# Patient Record
Sex: Female | Born: 1950 | Race: White | Hispanic: No | Marital: Married | Smoking: Current every day smoker
Health system: Southern US, Community
[De-identification: ages and names within clinical notes are randomized; demographics above are authoritative.]

## PROBLEM LIST (undated history)

## (undated) DIAGNOSIS — C801 Malignant (primary) neoplasm, unspecified: Secondary | ICD-10-CM

## (undated) DIAGNOSIS — I10 Essential (primary) hypertension: Secondary | ICD-10-CM

## (undated) HISTORY — DX: Essential (primary) hypertension: I10

---

## 1998-12-27 ENCOUNTER — Ambulatory Visit (HOSPITAL_COMMUNITY): Admission: RE | Admit: 1998-12-27 | Discharge: 1998-12-27 | Payer: Self-pay | Admitting: Obstetrics and Gynecology

## 1998-12-27 ENCOUNTER — Encounter: Payer: Self-pay | Admitting: Obstetrics and Gynecology

## 1999-05-11 ENCOUNTER — Other Ambulatory Visit: Admission: RE | Admit: 1999-05-11 | Discharge: 1999-05-11 | Payer: Self-pay | Admitting: Obstetrics and Gynecology

## 1999-07-03 ENCOUNTER — Encounter: Payer: Self-pay | Admitting: Obstetrics and Gynecology

## 1999-07-03 ENCOUNTER — Ambulatory Visit (HOSPITAL_COMMUNITY): Admission: RE | Admit: 1999-07-03 | Discharge: 1999-07-03 | Payer: Self-pay | Admitting: Obstetrics and Gynecology

## 1999-09-27 ENCOUNTER — Encounter (INDEPENDENT_AMBULATORY_CARE_PROVIDER_SITE_OTHER): Payer: Self-pay

## 1999-09-27 ENCOUNTER — Other Ambulatory Visit: Admission: RE | Admit: 1999-09-27 | Discharge: 1999-09-27 | Payer: Self-pay | Admitting: Obstetrics and Gynecology

## 1999-12-12 ENCOUNTER — Encounter (INDEPENDENT_AMBULATORY_CARE_PROVIDER_SITE_OTHER): Payer: Self-pay | Admitting: Specialist

## 1999-12-12 ENCOUNTER — Ambulatory Visit (HOSPITAL_COMMUNITY): Admission: RE | Admit: 1999-12-12 | Discharge: 1999-12-12 | Payer: Self-pay | Admitting: Obstetrics and Gynecology

## 2000-03-12 ENCOUNTER — Encounter (INDEPENDENT_AMBULATORY_CARE_PROVIDER_SITE_OTHER): Payer: Self-pay | Admitting: Specialist

## 2000-03-12 ENCOUNTER — Other Ambulatory Visit: Admission: RE | Admit: 2000-03-12 | Discharge: 2000-03-12 | Payer: Self-pay | Admitting: Obstetrics and Gynecology

## 2000-05-17 ENCOUNTER — Encounter (INDEPENDENT_AMBULATORY_CARE_PROVIDER_SITE_OTHER): Payer: Self-pay | Admitting: Specialist

## 2000-05-17 ENCOUNTER — Other Ambulatory Visit: Admission: RE | Admit: 2000-05-17 | Discharge: 2000-05-17 | Payer: Self-pay | Admitting: Obstetrics and Gynecology

## 2000-06-05 ENCOUNTER — Other Ambulatory Visit: Admission: RE | Admit: 2000-06-05 | Discharge: 2000-06-05 | Payer: Self-pay | Admitting: Obstetrics and Gynecology

## 2001-09-02 ENCOUNTER — Other Ambulatory Visit: Admission: RE | Admit: 2001-09-02 | Discharge: 2001-09-02 | Payer: Self-pay | Admitting: Obstetrics and Gynecology

## 2001-09-10 ENCOUNTER — Ambulatory Visit (HOSPITAL_COMMUNITY): Admission: RE | Admit: 2001-09-10 | Discharge: 2001-09-10 | Payer: Self-pay | Admitting: Obstetrics and Gynecology

## 2001-09-10 ENCOUNTER — Encounter: Payer: Self-pay | Admitting: Obstetrics and Gynecology

## 2001-09-17 ENCOUNTER — Encounter: Payer: Self-pay | Admitting: Obstetrics and Gynecology

## 2001-09-17 ENCOUNTER — Encounter: Admission: RE | Admit: 2001-09-17 | Discharge: 2001-09-17 | Payer: Self-pay | Admitting: Obstetrics and Gynecology

## 2001-09-22 ENCOUNTER — Encounter (INDEPENDENT_AMBULATORY_CARE_PROVIDER_SITE_OTHER): Payer: Self-pay | Admitting: *Deleted

## 2001-09-22 ENCOUNTER — Encounter: Admission: RE | Admit: 2001-09-22 | Discharge: 2001-09-22 | Payer: Self-pay | Admitting: Obstetrics and Gynecology

## 2001-09-22 ENCOUNTER — Encounter: Payer: Self-pay | Admitting: Obstetrics and Gynecology

## 2002-03-30 ENCOUNTER — Ambulatory Visit (HOSPITAL_COMMUNITY): Admission: RE | Admit: 2002-03-30 | Discharge: 2002-03-30 | Payer: Self-pay | Admitting: Obstetrics and Gynecology

## 2002-03-31 ENCOUNTER — Encounter: Payer: Self-pay | Admitting: Obstetrics and Gynecology

## 2002-09-17 ENCOUNTER — Other Ambulatory Visit: Admission: RE | Admit: 2002-09-17 | Discharge: 2002-09-17 | Payer: Self-pay | Admitting: Obstetrics and Gynecology

## 2002-09-30 ENCOUNTER — Encounter: Payer: Self-pay | Admitting: Geriatric Medicine

## 2002-09-30 ENCOUNTER — Encounter: Admission: RE | Admit: 2002-09-30 | Discharge: 2002-09-30 | Payer: Self-pay | Admitting: Geriatric Medicine

## 2003-09-20 ENCOUNTER — Other Ambulatory Visit: Admission: RE | Admit: 2003-09-20 | Discharge: 2003-09-20 | Payer: Self-pay | Admitting: Obstetrics and Gynecology

## 2003-10-08 ENCOUNTER — Encounter: Admission: RE | Admit: 2003-10-08 | Discharge: 2003-10-08 | Payer: Self-pay | Admitting: Obstetrics and Gynecology

## 2004-05-09 ENCOUNTER — Encounter: Admission: RE | Admit: 2004-05-09 | Discharge: 2004-05-09 | Payer: Self-pay | Admitting: Obstetrics and Gynecology

## 2004-10-02 ENCOUNTER — Other Ambulatory Visit: Admission: RE | Admit: 2004-10-02 | Discharge: 2004-10-02 | Payer: Self-pay | Admitting: Obstetrics and Gynecology

## 2004-10-13 ENCOUNTER — Encounter: Admission: RE | Admit: 2004-10-13 | Discharge: 2004-10-13 | Payer: Self-pay | Admitting: Obstetrics and Gynecology

## 2005-09-12 ENCOUNTER — Encounter: Admission: RE | Admit: 2005-09-12 | Discharge: 2005-09-12 | Payer: Self-pay | Admitting: Geriatric Medicine

## 2005-10-29 ENCOUNTER — Encounter: Admission: RE | Admit: 2005-10-29 | Discharge: 2005-10-29 | Payer: Self-pay | Admitting: Obstetrics and Gynecology

## 2006-07-25 ENCOUNTER — Other Ambulatory Visit: Admission: RE | Admit: 2006-07-25 | Discharge: 2006-07-25 | Payer: Self-pay | Admitting: Obstetrics and Gynecology

## 2006-10-31 ENCOUNTER — Encounter: Admission: RE | Admit: 2006-10-31 | Discharge: 2006-10-31 | Payer: Self-pay | Admitting: Obstetrics and Gynecology

## 2007-08-04 ENCOUNTER — Other Ambulatory Visit: Admission: RE | Admit: 2007-08-04 | Discharge: 2007-08-04 | Payer: Self-pay | Admitting: Obstetrics and Gynecology

## 2007-11-13 ENCOUNTER — Encounter: Admission: RE | Admit: 2007-11-13 | Discharge: 2007-11-13 | Payer: Self-pay | Admitting: Obstetrics and Gynecology

## 2008-08-04 ENCOUNTER — Other Ambulatory Visit: Admission: RE | Admit: 2008-08-04 | Discharge: 2008-08-04 | Payer: Self-pay | Admitting: Obstetrics and Gynecology

## 2008-12-29 ENCOUNTER — Encounter: Admission: RE | Admit: 2008-12-29 | Discharge: 2008-12-29 | Payer: Self-pay | Admitting: Obstetrics and Gynecology

## 2009-07-06 ENCOUNTER — Encounter: Admission: RE | Admit: 2009-07-06 | Discharge: 2009-07-06 | Payer: Self-pay | Admitting: Geriatric Medicine

## 2009-08-04 ENCOUNTER — Other Ambulatory Visit: Admission: RE | Admit: 2009-08-04 | Discharge: 2009-08-04 | Payer: Self-pay | Admitting: Obstetrics and Gynecology

## 2010-01-02 ENCOUNTER — Encounter: Admission: RE | Admit: 2010-01-02 | Discharge: 2010-01-02 | Payer: Self-pay | Admitting: Obstetrics and Gynecology

## 2010-08-16 ENCOUNTER — Other Ambulatory Visit
Admission: RE | Admit: 2010-08-16 | Discharge: 2010-08-16 | Payer: Self-pay | Source: Home / Self Care | Admitting: Obstetrics and Gynecology

## 2010-12-05 ENCOUNTER — Other Ambulatory Visit: Payer: Self-pay | Admitting: Obstetrics and Gynecology

## 2010-12-05 DIAGNOSIS — Z1231 Encounter for screening mammogram for malignant neoplasm of breast: Secondary | ICD-10-CM

## 2011-01-09 ENCOUNTER — Ambulatory Visit
Admission: RE | Admit: 2011-01-09 | Discharge: 2011-01-09 | Disposition: A | Discharge status: Home / Self Care | Payer: 59 | Source: Ambulatory Visit | Attending: Obstetrics and Gynecology | Admitting: Obstetrics and Gynecology

## 2011-01-09 DIAGNOSIS — Z1231 Encounter for screening mammogram for malignant neoplasm of breast: Secondary | ICD-10-CM

## 2011-01-10 ENCOUNTER — Other Ambulatory Visit: Payer: Self-pay | Admitting: Obstetrics and Gynecology

## 2011-01-10 DIAGNOSIS — R928 Other abnormal and inconclusive findings on diagnostic imaging of breast: Secondary | ICD-10-CM

## 2011-01-12 NOTE — Op Note (Signed)
[facility]  Patient:    Amber Hayden, Amber Hayden                          MRN: [id number] Proc. Date: 12/12/99 Adm. Date:  [id number] Attending:  Madelyn Flavors                           Operative Report  PREOPERATIVE DIAGNOSIS:       Right inner thigh nevus, quite irritated. 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  POSTOPERATIVE DIAGNOSIS:      Right inner thigh nevus, quite irritated. 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  OPERATION:                    Dilatation and curettage, hysteroscopy, removal of right inner thigh nevus.  SURGEON:                      Beather Arbour. Thomasena Edis, M.D.  ASSISTANT:  COMPLICATIONS:                None.  ANESTHESIA:                   Monitored anesthesia care plus 10 cc of 1% lidocaine paracervical block and anesthesia to thigh nevus.  ESTIMATED BLOOD LOSS:         Minimal.  FLUIDS:                       950 cc of Crystalloid.  DESCRIPTION OF PROCEDURE:     The patient was brought to the operating room, identified on the operating table.  After the patient was adequately sedated with MAC analgesia, the patient was placed in the dorsal lithotomy position and prepped and draped in the usual sterile fashion.  The bladder was straight catheterized for approximately 50 cc of clear yellow urine.  Examination under anesthesia revealed the uterus to be about 8 weeks size, retroverted, without any adnexal masses. After the patient was prepped and draped in the usual sterile fashion, the right inner thigh nevus which was only attached via a small stalk was infiltrated with 0.5 cc of 1% lidocaine and removed and sent to pathology for examination. Attention was then turned to the [facility].  The posterior lip of the cervix was infiltrated with 1 cc of 1% lidocaine and grasped with a single tooth tenaculum. The remaining lidocaine was infused for paracervical block.  The cervix was very gently dilated up to a #25 Pratt dilator.  The  uterus sounded to 8 cm.  Using Sorbitol as the distending medium, the [facility] hysteroscope was placed into the endometrial cavity.  The endometrial cavity was examined in a systematic fashion. Both tubal ostia were noted.  There was noted to be some shaggy endometrial tissue. After a careful and thorough hysteroscopic examination, the hysteroscope was removed and the serrated curet was placed.  The uterus was curetted in a systematic clockwise fashion with copious tissue obtained.  The Randall stone forceps were  placed and additional tissue was obtained.  At that point, the hysteroscope was  again placed.  All areas where there had been tissue were noted to be thoroughly sampled.  All tissue was noted to have been removed.  At that point, the procedure was then terminated.  There was noted to be no bleeding from the tenaculum site. The patient tolerated the procedure well without apparent complications and  was  transferred to the recovery room in stable condition after all sponge, needle, nd instrument counts were correct.  The patient was given the post [facility] instruction  sheet, urged to return to the office in two to three weeks for postoperative examination, and to call for any problems. DD:  12/12/99 TD:  12/12/99 Job: [id number]

## 2011-01-16 ENCOUNTER — Ambulatory Visit
Admission: RE | Admit: 2011-01-16 | Discharge: 2011-01-16 | Disposition: A | Discharge status: Home / Self Care | Payer: 59 | Source: Ambulatory Visit | Attending: Obstetrics and Gynecology | Admitting: Obstetrics and Gynecology

## 2011-01-16 DIAGNOSIS — R928 Other abnormal and inconclusive findings on diagnostic imaging of breast: Secondary | ICD-10-CM

## 2011-07-16 ENCOUNTER — Other Ambulatory Visit: Payer: Self-pay | Admitting: Geriatric Medicine

## 2011-07-16 DIAGNOSIS — E041 Nontoxic single thyroid nodule: Secondary | ICD-10-CM

## 2011-07-23 ENCOUNTER — Ambulatory Visit
Admission: RE | Admit: 2011-07-23 | Discharge: 2011-07-23 | Disposition: A | Discharge status: Home / Self Care | Payer: 59 | Source: Ambulatory Visit | Attending: Geriatric Medicine | Admitting: Geriatric Medicine

## 2011-07-23 DIAGNOSIS — E041 Nontoxic single thyroid nodule: Secondary | ICD-10-CM

## 2011-08-30 ENCOUNTER — Other Ambulatory Visit: Payer: Self-pay | Admitting: Obstetrics and Gynecology

## 2011-08-30 ENCOUNTER — Other Ambulatory Visit (HOSPITAL_COMMUNITY)
Admission: RE | Admit: 2011-08-30 | Discharge: 2011-08-30 | Disposition: A | Discharge status: Home / Self Care | Payer: 59 | Source: Ambulatory Visit | Attending: Obstetrics and Gynecology | Admitting: Obstetrics and Gynecology

## 2011-08-30 DIAGNOSIS — Z1159 Encounter for screening for other viral diseases: Secondary | ICD-10-CM | POA: Insufficient documentation

## 2011-08-30 DIAGNOSIS — Z01419 Encounter for gynecological examination (general) (routine) without abnormal findings: Secondary | ICD-10-CM | POA: Insufficient documentation

## 2011-12-27 ENCOUNTER — Other Ambulatory Visit: Payer: Self-pay | Admitting: Obstetrics and Gynecology

## 2011-12-27 DIAGNOSIS — Z1231 Encounter for screening mammogram for malignant neoplasm of breast: Secondary | ICD-10-CM

## 2012-01-18 ENCOUNTER — Ambulatory Visit
Admission: RE | Admit: 2012-01-18 | Discharge: 2012-01-18 | Disposition: A | Discharge status: Home / Self Care | Payer: 59 | Source: Ambulatory Visit | Attending: Obstetrics and Gynecology | Admitting: Obstetrics and Gynecology

## 2012-01-18 DIAGNOSIS — Z1231 Encounter for screening mammogram for malignant neoplasm of breast: Secondary | ICD-10-CM

## 2012-07-16 ENCOUNTER — Other Ambulatory Visit: Payer: Self-pay | Admitting: Geriatric Medicine

## 2012-07-16 DIAGNOSIS — E041 Nontoxic single thyroid nodule: Secondary | ICD-10-CM

## 2012-07-30 ENCOUNTER — Ambulatory Visit
Admission: RE | Admit: 2012-07-30 | Discharge: 2012-07-30 | Disposition: A | Discharge status: Home / Self Care | Payer: 59 | Source: Ambulatory Visit | Attending: Geriatric Medicine | Admitting: Geriatric Medicine

## 2012-07-30 DIAGNOSIS — E041 Nontoxic single thyroid nodule: Secondary | ICD-10-CM

## 2012-07-31 ENCOUNTER — Other Ambulatory Visit: Payer: Self-pay | Admitting: Dermatology

## 2012-09-02 ENCOUNTER — Other Ambulatory Visit (HOSPITAL_COMMUNITY)
Admission: RE | Admit: 2012-09-02 | Discharge: 2012-09-02 | Disposition: A | Discharge status: Home / Self Care | Payer: 59 | Source: Ambulatory Visit | Attending: Obstetrics and Gynecology | Admitting: Obstetrics and Gynecology

## 2012-09-02 ENCOUNTER — Other Ambulatory Visit: Payer: Self-pay | Admitting: Obstetrics and Gynecology

## 2012-09-02 DIAGNOSIS — Z01419 Encounter for gynecological examination (general) (routine) without abnormal findings: Secondary | ICD-10-CM | POA: Insufficient documentation

## 2012-12-18 ENCOUNTER — Other Ambulatory Visit: Payer: Self-pay

## 2012-12-18 DIAGNOSIS — Z1231 Encounter for screening mammogram for malignant neoplasm of breast: Secondary | ICD-10-CM

## 2013-01-28 ENCOUNTER — Ambulatory Visit
Admission: RE | Admit: 2013-01-28 | Discharge: 2013-01-28 | Disposition: A | Discharge status: Home / Self Care | Payer: 59 | Source: Ambulatory Visit

## 2013-01-28 DIAGNOSIS — Z1231 Encounter for screening mammogram for malignant neoplasm of breast: Secondary | ICD-10-CM

## 2013-07-17 ENCOUNTER — Other Ambulatory Visit: Payer: Self-pay | Admitting: Geriatric Medicine

## 2013-07-17 DIAGNOSIS — F17200 Nicotine dependence, unspecified, uncomplicated: Secondary | ICD-10-CM

## 2013-07-17 DIAGNOSIS — E041 Nontoxic single thyroid nodule: Secondary | ICD-10-CM

## 2013-08-05 ENCOUNTER — Ambulatory Visit
Admission: RE | Admit: 2013-08-05 | Discharge: 2013-08-05 | Disposition: A | Discharge status: Home / Self Care | Payer: No Typology Code available for payment source | Source: Ambulatory Visit | Attending: Geriatric Medicine | Admitting: Geriatric Medicine

## 2013-08-05 ENCOUNTER — Ambulatory Visit
Admission: RE | Admit: 2013-08-05 | Discharge: 2013-08-05 | Disposition: A | Discharge status: Home / Self Care | Payer: 59 | Source: Ambulatory Visit | Attending: Geriatric Medicine | Admitting: Geriatric Medicine

## 2013-08-05 DIAGNOSIS — E041 Nontoxic single thyroid nodule: Secondary | ICD-10-CM

## 2013-08-05 DIAGNOSIS — F17200 Nicotine dependence, unspecified, uncomplicated: Secondary | ICD-10-CM

## 2013-08-06 ENCOUNTER — Telehealth: Payer: Self-pay | Admitting: *Deleted

## 2013-08-06 ENCOUNTER — Other Ambulatory Visit (HOSPITAL_COMMUNITY): Payer: Self-pay | Admitting: Geriatric Medicine

## 2013-08-06 NOTE — Telephone Encounter (Signed)
Received referral today.  Called pt with appt left vm for pt to call with phone number

## 2013-08-07 ENCOUNTER — Encounter (HOSPITAL_COMMUNITY): Payer: Self-pay

## 2013-08-07 ENCOUNTER — Telehealth: Payer: Self-pay | Admitting: *Deleted

## 2013-08-07 ENCOUNTER — Ambulatory Visit (HOSPITAL_COMMUNITY)
Admission: RE | Admit: 2013-08-07 | Discharge: 2013-08-07 | Disposition: A | Discharge status: Home / Self Care | Payer: 59 | Source: Ambulatory Visit | Attending: Geriatric Medicine | Admitting: Geriatric Medicine

## 2013-08-07 DIAGNOSIS — R599 Enlarged lymph nodes, unspecified: Secondary | ICD-10-CM | POA: Insufficient documentation

## 2013-08-07 DIAGNOSIS — C3490 Malignant neoplasm of unspecified part of unspecified bronchus or lung: Secondary | ICD-10-CM | POA: Insufficient documentation

## 2013-08-07 DIAGNOSIS — N9489 Other specified conditions associated with female genital organs and menstrual cycle: Secondary | ICD-10-CM | POA: Insufficient documentation

## 2013-08-07 DIAGNOSIS — C787 Secondary malignant neoplasm of liver and intrahepatic bile duct: Secondary | ICD-10-CM | POA: Insufficient documentation

## 2013-08-07 MED ORDER — IOHEXOL 300 MG/ML  SOLN
100.0000 mL | Freq: Once | INTRAMUSCULAR | Status: AC | PRN
  Administered 2013-08-07: 100 mL via INTRAVENOUS

## 2013-08-07 NOTE — Telephone Encounter (Signed)
Called pt left vm message to call regarding appt for [facility] 08/13/13 with phone number

## 2013-08-11 ENCOUNTER — Encounter: Payer: Self-pay | Admitting: *Deleted

## 2013-08-11 ENCOUNTER — Other Ambulatory Visit (HOSPITAL_COMMUNITY): Payer: Self-pay | Admitting: Geriatric Medicine

## 2013-08-11 ENCOUNTER — Other Ambulatory Visit (HOSPITAL_COMMUNITY): Payer: Self-pay | Admitting: Radiology

## 2013-08-11 ENCOUNTER — Telehealth: Payer: Self-pay | Admitting: *Deleted

## 2013-08-11 DIAGNOSIS — K769 Liver disease, unspecified: Secondary | ICD-10-CM

## 2013-08-11 NOTE — Progress Notes (Signed)
Called referring office for progress notes and lab work.  Left vm message with Dr. Laverle Hobby nurse

## 2013-08-11 NOTE — Telephone Encounter (Signed)
Spoke with pt regarding appt for [facility] 08/13/13 at 1:45.  Pt verbalized understanding of time and place of appt

## 2013-08-11 NOTE — Telephone Encounter (Signed)
Pt called to notify me that she will have biopsy done tomorrow.  She had question about coming to [facility] on Thursday.  I stated she could keep appt.  She verbalized understanding

## 2013-08-12 ENCOUNTER — Ambulatory Visit (HOSPITAL_COMMUNITY)
Admission: RE | Admit: 2013-08-12 | Discharge: 2013-08-12 | Disposition: A | Discharge status: Home / Self Care | Payer: 59 | Source: Ambulatory Visit | Attending: Geriatric Medicine | Admitting: Geriatric Medicine

## 2013-08-12 VITALS — BP 128/61 | HR 59 | Temp 98.0°F | Resp 20 | Ht 62.5 in | Wt 109.0 lb

## 2013-08-12 DIAGNOSIS — Z01812 Encounter for preprocedural laboratory examination: Secondary | ICD-10-CM | POA: Insufficient documentation

## 2013-08-12 DIAGNOSIS — K769 Liver disease, unspecified: Secondary | ICD-10-CM

## 2013-08-12 DIAGNOSIS — I10 Essential (primary) hypertension: Secondary | ICD-10-CM | POA: Insufficient documentation

## 2013-08-12 DIAGNOSIS — R634 Abnormal weight loss: Secondary | ICD-10-CM | POA: Insufficient documentation

## 2013-08-12 DIAGNOSIS — C801 Malignant (primary) neoplasm, unspecified: Secondary | ICD-10-CM | POA: Insufficient documentation

## 2013-08-12 DIAGNOSIS — Z87891 Personal history of nicotine dependence: Secondary | ICD-10-CM | POA: Insufficient documentation

## 2013-08-12 DIAGNOSIS — F17200 Nicotine dependence, unspecified, uncomplicated: Secondary | ICD-10-CM | POA: Insufficient documentation

## 2013-08-12 DIAGNOSIS — C787 Secondary malignant neoplasm of liver and intrahepatic bile duct: Secondary | ICD-10-CM | POA: Insufficient documentation

## 2013-08-12 DIAGNOSIS — J984 Other disorders of lung: Secondary | ICD-10-CM | POA: Insufficient documentation

## 2013-08-12 LAB — CBC
HCT: 40.2 % (ref 36.0–46.0)
Hemoglobin: 13.8 g/dL (ref 12.0–15.0)
MCH: 31.1 pg (ref 26.0–34.0)
MCHC: 34.3 g/dL (ref 30.0–36.0)
MCV: 90.5 fL (ref 78.0–100.0)
RDW: 13 % (ref 11.5–15.5)

## 2013-08-12 LAB — PROTIME-INR: Prothrombin Time: 12.5 seconds (ref 11.6–15.2)

## 2013-08-12 MED ORDER — SODIUM CHLORIDE 0.9 % IV SOLN: Freq: Once | INTRAVENOUS | Status: DC

## 2013-08-12 MED ORDER — FENTANYL CITRATE 0.05 MG/ML IJ SOLN
INTRAMUSCULAR | Status: AC
  Filled 2013-08-12: qty 2

## 2013-08-12 MED ORDER — MIDAZOLAM HCL 2 MG/2ML IJ SOLN
INTRAMUSCULAR | Status: AC | PRN
  Administered 2013-08-12 (×2): 1 mg via INTRAVENOUS

## 2013-08-12 MED ORDER — HYDROCODONE-ACETAMINOPHEN 5-325 MG PO TABS: 1.0000 | ORAL_TABLET | ORAL | Status: DC | PRN

## 2013-08-12 MED ORDER — MIDAZOLAM HCL 2 MG/2ML IJ SOLN
INTRAMUSCULAR | Status: AC
  Filled 2013-08-12: qty 2

## 2013-08-12 MED ORDER — FENTANYL CITRATE 0.05 MG/ML IJ SOLN
INTRAMUSCULAR | Status: AC | PRN
  Administered 2013-08-12 (×2): 25 ug via INTRAVENOUS

## 2013-08-12 NOTE — Procedures (Signed)
Procedure:  Ultrasound guided core biopsy of liver Findings:  Multiple liver masses.  Lesion in right lobe sampled with 18 G core biopsy x 3 via 17 G needle.  Gelfoam injected. No immediate complications.

## 2013-08-12 NOTE — H&P (Signed)
Chief Complaint: "I am here for a liver biopsy." Referring Physician: Dr. Pete Glatter HPI: Amber Hayden is an 62 y.o. female with history of 1 1/2 ppd tobacco use x 40 years with ongoing use. She had a CT of her chest 08/05/13 which revealed a right lower lobe lung lesion and follow up CT abdomen/pelvis 08/07/13 revealed widespread hepatic metastatic disease. IR received request for image guided liver lesion biopsy. She does admit to 10-15 lb weight loss within the past year and decrease in appetite. She denies any change in her chronic cough or hemoptysis. She denies any chest pain. She denies any active bleeding or history of clotting disorders. She denies any fever or chills. She denies any previous complications with sedation during a colonoscopy. She denies any history of sleep apnea or home oxygen use.   Past Medical History: HTN, Tobacco use  Past Surgical History: No pertinent surgical history.   Family History: No family history on file.  Social History: (1) 1/2 ppd tobacco use x 40 years.  Allergies:  Allergies  Allergen Reactions  . Hctz [Hydrochlorothiazide] Nausea Only  . Plendil [Felodipine] Other (See Comments)    REACTION: fatigue    Medications: aspirin 81mg  daily, ACEI, calcium   Medication List    Notice   You have not been prescribed any medications.     Please HPI for pertinent positives, otherwise complete 10 system ROS negative.  Physical Exam: BP 137/64  Pulse 60  Temp(Src) 98 F (36.7 C)  Resp 18  Ht 5' 2.5" (1.588 m)  Wt 109 lb (49.442 kg)  BMI 19.61 kg/m2  SpO2 100% Body mass index is 19.61 kg/(m^2).  General Appearance:  Alert, cooperative, no distress  Head:  Normocephalic, without obvious abnormality, atraumatic  Neck: Supple, symmetrical, trachea midline  Lungs:   Clear to auscultation bilaterally, no w/r/r, respirations unlabored without use of accessory muscles.  Chest Wall:  No tenderness or deformity  Heart:  Regular rate and rhythm, S1,  S2 normal, no murmur, rub or gallop.  Abdomen:   Soft, RUQ tenderness, non distended, (+) BS  Extremities: Extremities normal, atraumatic, no cyanosis or edema  Pulses: 1+ and symmetric  Neurologic: Normal affect, no gross deficits.   Results for orders placed during the hospital encounter of 08/12/13 (from the past 48 hour(s))  APTT     Status: None   Collection Time    08/12/13 12:08 PM      Result Value Range   aPTT 27  24 - 37 seconds  CBC     Status: None   Collection Time    08/12/13 12:08 PM      Result Value Range   WBC 7.7  4.0 - 10.5 K/uL   RBC 4.44  3.87 - 5.11 MIL/uL   Hemoglobin 13.8  12.0 - 15.0 g/dL   HCT 16.1  09.6 - 04.5 %   MCV 90.5  78.0 - 100.0 fL   MCH 31.1  26.0 - 34.0 pg   MCHC 34.3  30.0 - 36.0 g/dL   RDW 40.9  81.1 - 91.4 %   Platelets 314  150 - 400 K/uL  PROTIME-INR     Status: None   Collection Time    08/12/13 12:08 PM      Result Value Range   Prothrombin Time 12.5  11.6 - 15.2 seconds   INR 0.95  0.00 - 1.49   No results found.  Assessment/Plan Tobacco abuse. Weight Loss. Right lower lobe lung lesion, CT 08/05/13  Liver lesion on CT 08/07/13 Request for image guided liver lesion biopsy Patient has been NPO, no blood thinners, labs and images reviewed. Risks and Benefits discussed with the patient. All of the patient's questions were answered, patient is agreeable to proceed. Consent signed and in chart.   Pattricia Boss D PA-C 08/12/2013, 1:41 PM

## 2013-08-12 NOTE — H&P (Signed)
Agree.  Patient seen.  For liver biopsy today.

## 2013-08-13 ENCOUNTER — Encounter: Payer: Self-pay | Admitting: *Deleted

## 2013-08-13 ENCOUNTER — Ambulatory Visit: Payer: 59 | Attending: Internal Medicine | Admitting: Physical Therapy

## 2013-08-13 ENCOUNTER — Telehealth: Payer: Self-pay | Admitting: Internal Medicine

## 2013-08-13 ENCOUNTER — Ambulatory Visit (HOSPITAL_BASED_OUTPATIENT_CLINIC_OR_DEPARTMENT_OTHER): Payer: 59 | Admitting: Internal Medicine

## 2013-08-13 DIAGNOSIS — C801 Malignant (primary) neoplasm, unspecified: Secondary | ICD-10-CM

## 2013-08-13 DIAGNOSIS — R293 Abnormal posture: Secondary | ICD-10-CM | POA: Insufficient documentation

## 2013-08-13 DIAGNOSIS — IMO0001 Reserved for inherently not codable concepts without codable children: Secondary | ICD-10-CM | POA: Insufficient documentation

## 2013-08-13 DIAGNOSIS — C787 Secondary malignant neoplasm of liver and intrahepatic bile duct: Secondary | ICD-10-CM

## 2013-08-13 DIAGNOSIS — C3491 Malignant neoplasm of unspecified part of right bronchus or lung: Secondary | ICD-10-CM | POA: Insufficient documentation

## 2013-08-13 DIAGNOSIS — C3490 Malignant neoplasm of unspecified part of unspecified bronchus or lung: Secondary | ICD-10-CM | POA: Insufficient documentation

## 2013-08-13 DIAGNOSIS — Z803 Family history of malignant neoplasm of breast: Secondary | ICD-10-CM

## 2013-08-13 DIAGNOSIS — F17200 Nicotine dependence, unspecified, uncomplicated: Secondary | ICD-10-CM

## 2013-08-13 NOTE — Progress Notes (Signed)
   Thoracic Treatment Summary Name:Aala JACQUELINA HEWINS Date:08/13/2013 DOB:07/17/1951 Your Medical Team Medical Oncologist: Dr. Arbutus Ped  Type and Stage of Lung Cancer Non-Small Cell Carcinoma  Stage: IV   Clinical stage is based on radiology exams.  Pathological stage will be determined after surgery.  Staging is based on the size of the tumor, involvement of lymph nodes or not, and whether or not the cancer center has spread. Recommendations Recommendations: Chemotherapy  These recommendations are based on information available as of today's consult.  This is subject to change depending further testing or exams. Next Steps Next Step: 1. Medical Oncology will set up MRI Brain, PET scan, Chemo Education, and Chemo appointments  Barriers to Care What do you perceive as a potential barrier that may prevent you from receiving your treatment plan? Nothing perceived at this time Questions Willette Pa, RN BSN Thoracic Oncology Nurse Navigator at [phone]  Annabelle Harman is a nurse navigator that is available to assist you through your cancer journey.  She can answer your questions and/or provide resources regarding your treatment plan, emotional support, or financial concerns.

## 2013-08-13 NOTE — Progress Notes (Signed)
[facility] Clinical Social Work  Clinical Social Work met with patient/spouse at [employer] to offer support and assess for needs.  MD reviewed medical condition with patient and recommended for further tests to diagnosis type of cancer/stage.  The patient was agreeable to the plan and had minimal questions for MD.  After MD visit, patient became tearful and CSW provided brief suppport.  CSW/patient discussed concept of lack of control and ways to cope while receiving more information.  Mrs. Chrobak shared that she enjoys gardening and reading cook books.  CSW encouraged patient/family to call with any questions or concerns and CSW will follow up as needed.  Kathrin Penner, MSW, LCSW Clinical Social Worker [facility] [phone]

## 2013-08-13 NOTE — Telephone Encounter (Signed)
appts made per 12/18 POF AVS and CAL given shh °

## 2013-08-14 ENCOUNTER — Telehealth: Payer: Self-pay | Admitting: *Deleted

## 2013-08-14 NOTE — Telephone Encounter (Signed)
Pt called and notified me that her PET and MRI are scheduled after appt with Dr. Arbutus Ped on 12/31.  I spoke with Dr. Arbutus Ped and he stated to keep appt time but to call radiology dept for pt to be on a work in list for an earlier appt.  I will call nuc med and request.

## 2013-08-15 ENCOUNTER — Encounter: Payer: Self-pay | Admitting: Internal Medicine

## 2013-08-15 NOTE — Patient Instructions (Signed)
Follow up visit in less than 2 weeks for evaluation and discussion of her treatment options based on the imaging studies as well as the biomarkers testing

## 2013-08-15 NOTE — Progress Notes (Signed)
Amber Hayden Telephone:([phone]   Fax:([phone]) [phone]  CONSULT NOTE  REFERRING PHYSICIAN: Dr. Merlene Laughter  REASON FOR CONSULTATION:  62 years old white female recently diagnosed with lung cancer.  HPI Amber Hayden is a 62 y.o. female with past medical history significant only for hypertension and cataract as well as long history of smoking. The patient was seen recently by her primary care physician Dr. Pete Glatter and because of her long history of smoking, he ordered screening low dose CT of the chest. It showed a 2.2 x 2.2 cm right lower lobe  pulmonary lesion consistent with primary bronchogenic carcinoma. There is also obstruction of the medial basilar bronchus of the right lower lobe. This could be obstructed with inflammatory debris, mucus or endobronchial tumor. The upper abdomen demonstrate diffuse hepatic metastatic disease. The patient has been complaining of pain in the right upper quadrant of her abdomen for two-month. She had CT scan of the abdomen and pelvis performed on 08/07/2013 and it showed multiple large hypoenhancing hepatic metastases. Representative lesions measure 5.6 x 4.6 cm in the posterior segment of the right hepatic lobe and 5.4 x 3.9 cm in the lateral segment of the left hepatic lobe. The liver is mildly enlarged. There is upper retroperitoneal lymphadenopathy, with a 12 mm short axis left periaortic node. There is a retrocaval node measuring 11 mm short axis. No lower abdominal or pelvic adenopathy is seen. There is no ascites or peritoneal nodularity.  On 08/12/2013, the patient had ultrasound guided core biopsy of the liver by interventional radiology and the final pathology (Accession: [id number]) was positive for adenocarcinoma. Immunohistochemical stains are performed. The tumor is positive for cytokeratin 7. The tumor is negative for cytokeratin 20, TTF-1 and CDX-2. The staining pattern is relatively nonspecific but can be seen in  adenocarcinoma of lung or pancreatobiliary primary source. Dr. Pete Glatter kindly referred the patient to me today for further evaluation and recommendation regarding treatment of her condition. When seen today the patient is feeling fine except for anxiety and lack of appetite. She lost around 6 pounds over the last few weeks. She denied having any other significant complaints. She has no chest pain, shortness breath, cough or hemoptysis. The patient denied having any headaches or visual changes. Her family history significant for a mother with breast cancer and Alzheimer, father had Alzheimer and died in his 89s and brother with coronary heart disease.  HPI  PAST MEDICAL HISTORY: significant only for hypertension and affect. The patient denied having any history of diabetes mellitus, coronary artery disease, hypercholesterolemia or stroke.  FAMILY HISTORY: mother had Alzheimer and breast cancer, father had Alzheimer and brother heart disease  SOCIAL HISTORY: The patient is married and has one son. She was accompanied by her husband Amber Hayden. She is currently retired and has a history of smoking one half pack per day for around 42 years and unfortunately she continues to smoke. I strongly recommended her to quit smoking and offered her smoke cessation program. She also drink 5-6 beers every night and no history of drug abuse.    Allergies  Allergen Reactions  . Hctz [Hydrochlorothiazide] Nausea Only  . Plendil [Felodipine] Other (See Comments)    REACTION: fatigue    Current Outpatient Prescriptions  Medication Sig Dispense Refill  . amLODipine (NORVASC) 10 MG tablet Take 10 mg by mouth daily.      . calcium carbonate (TUMS - DOSED IN MG ELEMENTAL CALCIUM) 500 MG chewable tablet Chew 1 tablet by  mouth daily as needed for indigestion or heartburn.      Marland Kitchen lisinopril (PRINIVIL,ZESTRIL) 20 MG tablet Take 20 mg by mouth daily.      . nebivolol (BYSTOLIC) 10 MG tablet Take 10 mg by mouth daily.        Marland Kitchen aspirin EC 81 MG tablet Take 81 mg by mouth daily.       No current facility-administered medications for this visit.    Review of Systems  Constitutional: positive for fatigue and weight loss Eyes: negative Ears, nose, mouth, throat, and face: negative Respiratory: negative Cardiovascular: negative Gastrointestinal: positive for abdominal pain Genitourinary:negative Integument/breast: negative Hematologic/lymphatic: negative Musculoskeletal:negative Neurological: negative Behavioral/Psych: positive for anxiety Endocrine: negative Allergic/Immunologic: negative  Physical Exam  ZOX:WRUEA, healthy, no distress, well nourished, well developed and anxious SKIN: skin color, texture, turgor are normal, no rashes or significant lesions HEAD: Normocephalic, No masses, lesions, tenderness or abnormalities EYES: normal, PERRLA EARS: External ears normal, Canals clear OROPHARYNX:no exudate, no erythema and lips, buccal mucosa, and tongue normal  NECK: supple, no adenopathy, no JVD LYMPH:  no palpable lymphadenopathy, no hepatosplenomegaly BREAST:not examined LUNGS: clear to auscultation , and palpation HEART: regular rate & rhythm, no murmurs and no gallops ABDOMEN:abdomen soft, normal bowel sounds and tenderness at the right upper quadrant of the abdomen. BACK: Back symmetric, no curvature., No CVA tenderness EXTREMITIES:no joint deformities, effusion, or inflammation, no edema, no skin discoloration  NEURO: alert & oriented x 3 with fluent speech, no focal motor/sensory deficits  PERFORMANCE STATUS: ECOG 1  LABORATORY DATA: Lab Results  Component Value Date   WBC 7.7 08/12/2013   HGB 13.8 08/12/2013   HCT 40.2 08/12/2013   MCV 90.5 08/12/2013   PLT 314 08/12/2013      Chemistry   No results found for this basename: NA, K, CL, CO2, BUN, CREATININE, GLU   No results found for this basename: CALCIUM, ALKPHOS, AST, ALT, BILITOT       RADIOGRAPHIC STUDIES: [place] Soft  Tissue Head/neck  08/05/2013   CLINICAL DATA:  Follow-up thyroid nodule  EXAM: THYROID ULTRASOUND  TECHNIQUE: Ultrasound examination of the thyroid gland and adjacent soft tissues was performed.  COMPARISON:  07/30/2012  FINDINGS: Right thyroid lobe  Measurements: 4.1 x 1.2 x 1.6 cm. 5 mm diameter nodule at upper pole. Small shadowing calcifications within mid right lobe, 3 mm and 4 mm in size, not significantly changed.  Left thyroid lobe  Measurements: 3.7 x 1.1 x 1.4 cm. Hypoechoic nodule mid left lobe 7 x 3 x 5 mm, not significantly changed.  Isthmus  Thickness: 2 mm thick.  No nodules visualized.  Lymphadenopathy  None visualized.  IMPRESSION: Stable small bilateral thyroid nodules and right thyroid calcifications.  Findings do not meet current SRU consensus criteria for biopsy. Follow-up by clinical exam is recommended. If patient has known risk factors for thyroid carcinoma, consider follow-up ultrasound in 12 months. If patient is clinically hyperthyroid, consider nuclear medicine thyroid uptake and scan.Reference: Management of Thyroid Nodules Detected at [place]: Society of Radiologists in Ultrasound Consensus Conference Statement. Radiology 2005; [id number].   Electronically Signed   By: Ulyses Southward M.D.   On: 08/05/2013 13:10   Ct Abdomen Pelvis W Contrast  08/07/2013   CLINICAL DATA:  Lung cancer with liver lesions on chest CT.  EXAM: CT ABDOMEN AND PELVIS WITH CONTRAST  TECHNIQUE: Multidetector CT imaging of the abdomen and pelvis was performed using the standard protocol following bolus administration of intravenous contrast.  CONTRAST:  OMNIPAQUE  IOHEXOL 300 MG/ML  SOLN  COMPARISON:  Abdominal CT 04/05/2004.  Chest CT 08/05/2013.  FINDINGS: The visualized lung bases are stable with patchy right infrahilar pulmonary opacity. The previously demonstrated right lower lobe mass is not imaged on this examination. There is no significant pleural effusion.  As demonstrated on the recent chest CT,  there are multiple large hypoenhancing hepatic metastases. Representative lesions measure 5.6 x 4.6 cm in the posterior segment of the right hepatic lobe (image 14) and 5.4 x 3.9 cm in the lateral segment of the left hepatic lobe (image 16). The liver is mildly enlarged.  There is no adrenal mass. The spleen, gallbladder, pancreas and kidneys appear normal.  There is upper retroperitoneal lymphadenopathy, with a 12 mm short axis left periaortic node on image 27. There is a retrocaval node measuring 11 mm short axis on image 26. No lower abdominal or pelvic adenopathy is seen. There is no ascites or peritoneal nodularity.  There is no evidence of bowel obstruction or focal mucosal lesion. There is uterine myometrial heterogeneity and nodularity consistent with fibroids. A 6.6 x 5.3 cm right adnexal mass appears contiguous with the uterus and similar in density, probably a large exophytic fibroid. The bladder appears normal.  Within the upper sacrum off midline to the right, there is a lucent lesion with central sclerosis measuring 2.0 cm on image 51. The central sclerotic component was present previously, although the surrounding lucency appears progressive based on comparison with prior axial images. Therefore, a lytic metastasis cannot be excluded. No other focal lesions are identified. There is degenerative disc disease at L5-S1.  IMPRESSION: 1. Widespread hepatic metastatic disease. 2. Upper retroperitoneal lymphadenopathy suspicious for metastases. 3. Increased lucent component of a lesion involving the upper right sacrum, possibly a metastasis. No other osseous metastases identified. 4. Right adnexal mass is new from 2005, but probably an exophytic fibroid.   Electronically Signed   By: Roxy Horseman M.D.   On: 08/07/2013 12:14   US Biopsy  08/12/2013   CLINICAL DATA:  Multiple large hepatic lesions and right lower lobe lung mass. The patient presents for liver biopsy.  EXAM: ULTRASOUND GUIDED CORE BIOPSY OF  LIVER  MEDICATIONS: 2.0 mg IV Versed; 50 mcg IV Fentanyl  Total Moderate Sedation Time: 19 min  PROCEDURE: The procedure, risks, benefits, and alternatives were explained to the patient. Questions regarding the procedure were encouraged and answered. The patient understands and consents to the procedure.  The abdominal wall was prepped with Betadine in a sterile fashion, and a sterile drape was applied covering the operative field. A sterile gown and sterile gloves were used for the procedure. Local anesthesia was provided with 1% Lidocaine.  Ultrasound was used to localize liver lesions. Under ultrasound guidance, a 17 gauge needle was advanced into the right lobe of the liver. Core biopsy was performed of a right lobe liver lesion. A total of 3 separate 18 gauge core biopsy samples were obtained and submitted in formalin. As the outer needle was retracted, several Gel-Foam pledgets were injected into the needle tract.  COMPLICATIONS: None.  FINDINGS: Multiple large hepatic masses are seen in both lobes of the liver. A lesion in the right lobe was targeted measuring nearly 5 cm in diameter. Solid tissue was obtained from the lesion.  IMPRESSION: Ultrasound-guided core biopsy performed of one of multiple large hepatic mass lesions.   Electronically Signed   By: Irish Lack M.D.   On: 08/12/2013 17:11   Ct Chest Wo/cm Screening  08/05/2013   CLINICAL DATA:  Strong smoking history.  EXAM: CT CHEST SCREENING WITHOUT CONTRAST  TECHNIQUE: Multidetector CT imaging of the chest was performed following the standard low-dose protocol without IV contrast.  COMPARISON:  None.  FINDINGS: The chest wall is unremarkable. No definite breast masses, supraclavicular or axillary adenopathy. The thyroid gland is grossly normal. The bony thorax is intact. No destructive bone lesions or spinal canal compromise.  The heart is normal in size. No pericardial effusion. Small scattered mediastinal and hilar lymph nodes without  obvious adenopathy. There are dense aortic calcifications but no focal aneurysm. The esophagus is grossly normal.  Examination of the lung parenchyma demonstrates moderate emphysematous changes. There is a 2.2 x 2.2 cm right lower lobe pulmonary lesion consistent with primary bronchogenic carcinoma. There is also obstruction of the medial basilar bronchus of the right lower lobe. This could be obstructed with inflammatory debris, mucus or endobronchial tumor.  Patchy areas of atelectasis are noted. There also small pulmonary nodules. No pleural effusion.  The upper abdomen demonstrates diffuse hepatic metastatic disease. No obvious adrenal lesions.  IMPRESSION: 2.2 x 2.2 cm right lower lobe pulmonary lesion suspicious for primary lung neoplasm.  No obvious mediastinal or hilar lymphadenopathy.  Diffuse hepatic metastatic disease and small scattered pulmonary nodules likely reflecting pulmonary metastatic disease.  Right lower lobe Medial basilar bronchus obstruction possibly by mucoid impaction or tumor.  These results will be called to the ordering clinician or representative by the Radiologist Assistant, and communication documented in the PACS Dashboard.   Electronically Signed   By: Loralie Champagne M.D.   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  PLAN: I had a lengthy discussion with the patient and her husband today about her current disease stage, prognosis and treatment options.I will order a PET scan as well as MRI of the brain to complete the staging workup for this patient and to rule out any other metastatic disease. I ask the pathology to send her tissue block to [facility] for molecular biomarkers testing. If the patient has actionable driver mutation, she can be treated with biologic targeted  agent. Otherwise the patient would be consider for treatment with systemic chemotherapy. I will arrange for the patient will come back for followup visit in less than 2 weeks for evaluation and discussion of her treatment options based on the staging scans as well as the molecular biomarkers.  For smoke cessation, I strongly encouraged the patient to quit smoking and offered her smoke cessation program but the patient is working on quitting smoking on her own. She was advised to call immediately if she has any concerning symptoms in the interval.  The patient voices understanding of current disease status and treatment options and is in agreement with the current care plan.  All questions were answered. The patient knows to call the clinic with any problems, questions or concerns. We can certainly see the patient much sooner if necessary.  Thank you so much for allowing me to participate in the care of Amber Hayden. I will continue to follow up the patient with you and assist in her care.  I spent 40 minutes counseling the patient face to face. The total time spent in the appointment was 60 minutes.  Aaleigha Bozza K. 08/15/2013, 4:05 PM

## 2013-08-17 ENCOUNTER — Encounter: Payer: Self-pay | Admitting: *Deleted

## 2013-08-17 NOTE — Progress Notes (Signed)
Faxed [facility] request to pathology dept

## 2013-08-21 ENCOUNTER — Ambulatory Visit (HOSPITAL_COMMUNITY)
Admission: RE | Admit: 2013-08-21 | Discharge: 2013-08-21 | Disposition: A | Discharge status: Home / Self Care | Payer: 59 | Source: Ambulatory Visit | Attending: Internal Medicine | Admitting: Internal Medicine

## 2013-08-21 ENCOUNTER — Encounter: Payer: Self-pay | Admitting: *Deleted

## 2013-08-21 DIAGNOSIS — R51 Headache: Secondary | ICD-10-CM | POA: Insufficient documentation

## 2013-08-21 DIAGNOSIS — C3490 Malignant neoplasm of unspecified part of unspecified bronchus or lung: Secondary | ICD-10-CM | POA: Insufficient documentation

## 2013-08-21 DIAGNOSIS — I6789 Other cerebrovascular disease: Secondary | ICD-10-CM | POA: Insufficient documentation

## 2013-08-21 MED ORDER — GADOBENATE DIMEGLUMINE 529 MG/ML IV SOLN
10.0000 mL | Freq: Once | INTRAVENOUS | Status: AC | PRN
  Administered 2013-08-21: 10 mL via INTRAVENOUS

## 2013-08-21 NOTE — Progress Notes (Signed)
Received fax from [facility] regarding case update.  Results expected by 09/05/13.  Faxed placed on Dr. Asa Lente desk

## 2013-08-25 ENCOUNTER — Ambulatory Visit: Payer: 59 | Admitting: Internal Medicine

## 2013-08-25 ENCOUNTER — Other Ambulatory Visit: Payer: 59

## 2013-08-26 ENCOUNTER — Ambulatory Visit (HOSPITAL_BASED_OUTPATIENT_CLINIC_OR_DEPARTMENT_OTHER): Payer: 59 | Admitting: Internal Medicine

## 2013-08-26 ENCOUNTER — Encounter (HOSPITAL_COMMUNITY): Payer: 59

## 2013-08-26 ENCOUNTER — Other Ambulatory Visit (HOSPITAL_COMMUNITY): Payer: 59

## 2013-08-26 ENCOUNTER — Other Ambulatory Visit (HOSPITAL_BASED_OUTPATIENT_CLINIC_OR_DEPARTMENT_OTHER): Payer: 59

## 2013-08-26 ENCOUNTER — Ambulatory Visit (HOSPITAL_COMMUNITY)
Admission: RE | Admit: 2013-08-26 | Discharge: 2013-08-26 | Disposition: A | Discharge status: Home / Self Care | Payer: 59 | Source: Ambulatory Visit | Attending: Internal Medicine | Admitting: Internal Medicine

## 2013-08-26 ENCOUNTER — Encounter (HOSPITAL_COMMUNITY): Payer: Self-pay

## 2013-08-26 ENCOUNTER — Telehealth: Payer: Self-pay | Admitting: *Deleted

## 2013-08-26 DIAGNOSIS — C3490 Malignant neoplasm of unspecified part of unspecified bronchus or lung: Secondary | ICD-10-CM | POA: Insufficient documentation

## 2013-08-26 DIAGNOSIS — D259 Leiomyoma of uterus, unspecified: Secondary | ICD-10-CM | POA: Insufficient documentation

## 2013-08-26 DIAGNOSIS — R599 Enlarged lymph nodes, unspecified: Secondary | ICD-10-CM | POA: Insufficient documentation

## 2013-08-26 DIAGNOSIS — C3430 Malignant neoplasm of lower lobe, unspecified bronchus or lung: Secondary | ICD-10-CM

## 2013-08-26 DIAGNOSIS — R1909 Other intra-abdominal and pelvic swelling, mass and lump: Secondary | ICD-10-CM | POA: Insufficient documentation

## 2013-08-26 DIAGNOSIS — R911 Solitary pulmonary nodule: Secondary | ICD-10-CM | POA: Insufficient documentation

## 2013-08-26 DIAGNOSIS — C787 Secondary malignant neoplasm of liver and intrahepatic bile duct: Secondary | ICD-10-CM

## 2013-08-26 LAB — CBC WITH DIFFERENTIAL/PLATELET
BASO%: 0.9 % (ref 0.0–2.0)
Basophils Absolute: 0.1 10*3/uL (ref 0.0–0.1)
EOS%: 1.8 % (ref 0.0–7.0)
HGB: 13.3 g/dL (ref 11.6–15.9)
MCH: 31.2 pg (ref 25.1–34.0)
MCHC: 34 g/dL (ref 31.5–36.0)
MCV: 91.7 fL (ref 79.5–101.0)
MONO%: 8.1 % (ref 0.0–14.0)
NEUT%: 62.8 % (ref 38.4–76.8)
RDW: 13.3 % (ref 11.2–14.5)

## 2013-08-26 LAB — COMPREHENSIVE METABOLIC PANEL (CC13)
ALT: 57 U/L — ABNORMAL HIGH (ref 0–55)
AST: 87 U/L — ABNORMAL HIGH (ref 5–34)
Alkaline Phosphatase: 179 U/L — ABNORMAL HIGH (ref 40–150)
Anion Gap: 13 mEq/L — ABNORMAL HIGH (ref 3–11)
BUN: 19.1 mg/dL (ref 7.0–26.0)
Calcium: 10.1 mg/dL (ref 8.4–10.4)
Chloride: 102 mEq/L (ref 98–109)
Creatinine: 0.8 mg/dL (ref 0.6–1.1)
Potassium: 4.3 mEq/L (ref 3.5–5.1)
Sodium: 139 mEq/L (ref 136–145)
Total Protein: 7.3 g/dL (ref 6.4–8.3)

## 2013-08-26 LAB — GLUCOSE, CAPILLARY: Glucose-Capillary: 90 mg/dL (ref 70–99)

## 2013-08-26 MED ORDER — CYANOCOBALAMIN 1000 MCG/ML IJ SOLN
INTRAMUSCULAR | Status: AC
  Filled 2013-08-26: qty 1

## 2013-08-26 MED ORDER — FOLIC ACID 1 MG PO TABS: 1.0000 mg | ORAL_TABLET | Freq: Every day | ORAL | Status: DC

## 2013-08-26 MED ORDER — DEXAMETHASONE 4 MG PO TABS: ORAL_TABLET | ORAL | Status: DC

## 2013-08-26 MED ORDER — PROCHLORPERAZINE MALEATE 10 MG PO TABS: 10.0000 mg | ORAL_TABLET | Freq: Four times a day (QID) | ORAL | Status: DC | PRN

## 2013-08-26 MED ORDER — OXYCODONE HCL 5 MG PO TABS: 5.0000 mg | ORAL_TABLET | Freq: Four times a day (QID) | ORAL | Status: DC | PRN

## 2013-08-26 MED ORDER — FLUDEOXYGLUCOSE F - 18 (FDG) INJECTION
14.4000 | Freq: Once | INTRAVENOUS | Status: AC | PRN
  Administered 2013-08-26: 14.4 via INTRAVENOUS

## 2013-08-26 MED ORDER — CYANOCOBALAMIN 1000 MCG/ML IJ SOLN
1000.0000 ug | Freq: Once | INTRAMUSCULAR | Status: AC
  Administered 2013-08-26: 1000 ug via INTRAMUSCULAR

## 2013-08-26 NOTE — Telephone Encounter (Signed)
appts made and printed. Pt is aware tx will be added. i emailed MW to add the tx...td 

## 2013-08-26 NOTE — Progress Notes (Signed)
[facility] [phone]   [phone]  OFFICE PROGRESS NOTE  Ginette Otto, MD [address] [place] [address]  DIAGNOSIS: Stage IV (T1b, N2, M1b) non-small cell lung cancer, adenocarcinoma, presented with a right lower lobe lung mass as well as mediastinal, hilar, bilateral supraclavicular lymphadenopathy in addition to extensive liver metastases and retroperitoneal lymphadenopathy diagnosed in December of 2014.  MOLECULAR BIOMARKERS: still pending  PRIOR THERAPY: None   CURRENT THERAPY: systemic chemotherapy with carboplatin for AUC of 5 and Alimta 500 mg/M2 every 3 weeks. First dose 09/02/2013. DISEASE STAGE:  CHEMOTHERAPY INTENT: palliative  CURRENT # OF CHEMOTHERAPY CYCLES: 1  CURRENT ANTIEMETICS: Zofran, dexamethasone and Compazine.  CURRENT SMOKING STATUS: former smoker  ORAL CHEMOTHERAPY AND CONSENT: None  CURRENT BISPHOSPHONATES USE: None  PAIN MANAGEMENT: 0/10  NARCOTICS INDUCED CONSTIPATION: None  LIVING WILL AND CODE STATUS: Full Code  INTERVAL HISTORY: Amber Hayden 62 y.o. female returns to the clinic today for follow up visit accompanied by her husband. The patient is feeling fine today with no specific complaints. She denied having any significant chest pain, shortness breath, cough or hemoptysis. She continues to have pain on the right upper quadrant of the abdomen and she takes Tylenol for pain management. She denied having any nausea or vomiting and no change in her bowel movement. The patient denied having any significant recent weight loss or night sweats. She had recent MRI of the brain as well as a PET scan performed and she is here for evaluation and discussion of her scan results and treatment options. The molecular Biomarker testing are still pending.  MEDICAL HISTORY: Past Medical History  Diagnosis Date  . HTN (hypertension)     ALLERGIES:  is allergic to hctz and  plendil.  MEDICATIONS:  Current Outpatient Prescriptions  Medication Sig Dispense Refill  . amLODipine (NORVASC) 10 MG tablet Take 10 mg by mouth daily.      Marland Kitchen aspirin EC 81 MG tablet Take 81 mg by mouth daily.      . calcium carbonate (TUMS - DOSED IN MG ELEMENTAL CALCIUM) 500 MG chewable tablet Chew 1 tablet by mouth daily as needed for indigestion or heartburn.      Marland Kitchen lisinopril (PRINIVIL,ZESTRIL) 20 MG tablet Take 20 mg by mouth daily.      . nebivolol (BYSTOLIC) 10 MG tablet Take 10 mg by mouth daily.       No current facility-administered medications for this visit.    REVIEW OF SYSTEMS:  Constitutional: negative Eyes: negative Ears, nose, mouth, throat, and face: negative Respiratory: negative Cardiovascular: negative Gastrointestinal: positive for abdominal pain Genitourinary:negative Integument/breast: negative Hematologic/lymphatic: negative Musculoskeletal:negative Neurological: negative Behavioral/Psych: negative Endocrine: negative Allergic/Immunologic: negative   PHYSICAL EXAMINATION: General appearance: alert, cooperative and no distress Head: Normocephalic, without obvious abnormality, atraumatic Neck: no adenopathy, no JVD, supple, symmetrical, trachea midline and thyroid not enlarged, symmetric, no tenderness/mass/nodules Lymph nodes: Cervical, supraclavicular, and axillary nodes normal. Resp: clear to auscultation bilaterally Back: symmetric, no curvature. ROM normal. No CVA tenderness. Cardio: regular rate and rhythm, S1, S2 normal, no murmur, click, rub or gallop GI: soft, non-tender; bowel sounds normal; no masses,  no organomegaly Extremities: extremities normal, atraumatic, no cyanosis or edema Neurologic: Alert and oriented X 3, normal strength and tone. Normal symmetric reflexes. Normal coordination and gait  ECOG PERFORMANCE STATUS: 1 - Symptomatic but completely ambulatory  Blood pressure 121/69, pulse 71, temperature 97 F (36.1 C), temperature  source Oral,  resp. rate 18, height 5\' 2"  (1.575 m), weight 107 lb 8 oz (48.762 kg), SpO2 97.00%.  LABORATORY DATA: Lab Results  Component Value Date   WBC 8.0 08/26/2013   HGB 13.3 08/26/2013   HCT 39.2 08/26/2013   MCV 91.7 08/26/2013   PLT 327 08/26/2013      Chemistry      Component Value Date/Time   CREATININE 0.80 09-11-2013 0800   No results found for this basename: CALCIUM, ALKPHOS, AST, ALT, BILITOT       RADIOGRAPHIC STUDIES: Mr Lodema Pilot Contrast  09-11-2013   CLINICAL DATA:  New diagnosis lung cancer. Headache. Rule out metastatic disease.  EXAM: MRI HEAD WITHOUT AND WITH CONTRAST  TECHNIQUE: Multiplanar, multiecho pulse sequences of the brain and surrounding structures were obtained without and with intravenous contrast.  CONTRAST:  10mL MULTIHANCE GADOBENATE DIMEGLUMINE 529 MG/ML IV SOLN  COMPARISON:  None.  FINDINGS: Ventricle size is normal. Patchy hyperintensities in the cerebral white matter bilaterally with the appearance of chronic microvascular ischemia. No acute infarct. Negative for hemorrhage.  Negative for mass or edema. Postcontrast imaging reveals no enhancing lesions in the brain. No metastatic deposits are identified. No lesions in the calvarium are identified.  Mild mucosal edema in the paranasal sinuses.  IMPRESSION: Negative for metastatic disease.  Mild chronic microvascular ischemic changes in the white matter.   Electronically Signed   By: Marlan Palau M.D.   On: 2013/09/11 08:48   US Soft Tissue Head/neck  08/05/2013   CLINICAL DATA:  Follow-up thyroid nodule  EXAM: THYROID ULTRASOUND  TECHNIQUE: Ultrasound examination of the thyroid gland and adjacent soft tissues was performed.  COMPARISON:  07/30/2012  FINDINGS: Right thyroid lobe  Measurements: 4.1 x 1.2 x 1.6 cm. 5 mm diameter nodule at upper pole. Small shadowing calcifications within mid right lobe, 3 mm and 4 mm in size, not significantly changed.  Left thyroid lobe  Measurements: 3.7 x 1.1 x  1.4 cm. Hypoechoic nodule mid left lobe 7 x 3 x 5 mm, not significantly changed.  Isthmus  Thickness: 2 mm thick.  No nodules visualized.  Lymphadenopathy  None visualized.  IMPRESSION: Stable small bilateral thyroid nodules and right thyroid calcifications.  Findings do not meet current SRU consensus criteria for biopsy. Follow-up by clinical exam is recommended. If patient has known risk factors for thyroid carcinoma, consider follow-up ultrasound in 12 months. If patient is clinically hyperthyroid, consider nuclear medicine thyroid uptake and scan.Reference: Management of Thyroid Nodules Detected at [place]: Society of Radiologists in Ultrasound Consensus Conference Statement. Radiology 2005; [id number].   Electronically Signed   By: Ulyses Southward M.D.   On: 08/05/2013 13:10   Ct Abdomen Pelvis W Contrast  08/07/2013   CLINICAL DATA:  Lung cancer with liver lesions on chest CT.  EXAM: CT ABDOMEN AND PELVIS WITH CONTRAST  TECHNIQUE: Multidetector CT imaging of the abdomen and pelvis was performed using the standard protocol following bolus administration of intravenous contrast.  CONTRAST:  OMNIPAQUE IOHEXOL 300 MG/ML  SOLN  COMPARISON:  Abdominal CT 04/05/2004.  Chest CT 08/05/2013.  FINDINGS: The visualized lung bases are stable with patchy right infrahilar pulmonary opacity. The previously demonstrated right lower lobe mass is not imaged on this examination. There is no significant pleural effusion.  As demonstrated on the recent chest CT, there are multiple large hypoenhancing hepatic metastases. Representative lesions measure 5.6 x 4.6 cm in the posterior segment of the right hepatic lobe (image 14) and 5.4 x 3.9 cm in  the lateral segment of the left hepatic lobe (image 16). The liver is mildly enlarged.  There is no adrenal mass. The spleen, gallbladder, pancreas and kidneys appear normal.  There is upper retroperitoneal lymphadenopathy, with a 12 mm short axis left periaortic node on image 27. There is  a retrocaval node measuring 11 mm short axis on image 26. No lower abdominal or pelvic adenopathy is seen. There is no ascites or peritoneal nodularity.  There is no evidence of bowel obstruction or focal mucosal lesion. There is uterine myometrial heterogeneity and nodularity consistent with fibroids. A 6.6 x 5.3 cm right adnexal mass appears contiguous with the uterus and similar in density, probably a large exophytic fibroid. The bladder appears normal.  Within the upper sacrum off midline to the right, there is a lucent lesion with central sclerosis measuring 2.0 cm on image 51. The central sclerotic component was present previously, although the surrounding lucency appears progressive based on comparison with prior axial images. Therefore, a lytic metastasis cannot be excluded. No other focal lesions are identified. There is degenerative disc disease at L5-S1.  IMPRESSION: 1. Widespread hepatic metastatic disease. 2. Upper retroperitoneal lymphadenopathy suspicious for metastases. 3. Increased lucent component of a lesion involving the upper right sacrum, possibly a metastasis. No other osseous metastases identified. 4. Right adnexal mass is new from 2005, but probably an exophytic fibroid.   Electronically Signed   By: Roxy Horseman M.D.   On: 08/07/2013 12:14   Nm Pet Image Initial (pi) Skull Base To Thigh  08/26/2013   CLINICAL DATA:  Initial treatment strategy for lung cancer.  EXAM: NUCLEAR MEDICINE PET SKULL BASE TO THIGH  FASTING BLOOD GLUCOSE:  Value: 90mg /dl  TECHNIQUE: 16.1 mCi W-96 FDG was injected intravenously. CT data was obtained and used for attenuation correction and anatomic localization only. (This was not acquired as a diagnostic CT examination.) Additional exam technical data entered on technologist worksheet.  COMPARISON:  CT scans 07/2009 and 08/07/2013  FINDINGS: NECK  Bilateral enlarged and hypermetabolic supraclavicular lymph nodes with SUV max of 4.3.  CHEST  Right lower lobe lung  lesion is hypermetabolic with SUV max is 7.5. There are numerous enlarged and hypermetabolic bilateral hilar and bilateral mediastinal lymph nodes. An 8 mm left upper lobe nodular density (on image number 98 of the prior chest CT) is hypermetabolic with SUV max of 3.2. Other smaller bilateral pulmonary nodules are not definitely hypermetabolic but likely metastatic disease. Branching areas of hypermetabolism noted in the right upper lobe and right lower lobe most likely reflect endobronchial spread of tumor or severe bronchitis.  ABDOMEN/PELVIS  Diffuse hepatic metastatic disease with large necrotic metastasis involving both lobes of the liver. SUV max is 12.0. There are also enlarged and hypermetabolic retroperitoneal lymph nodes in the abdomen and pelvis. The bulky pelvis mass noted on the prior CT scan is most likely a large fibroid. Other smaller fibroids are noted.  SKELETON  No focal hypermetabolic activity to suggest skeletal metastasis.  IMPRESSION: 1. Hypermetabolic right lower lobe lung mass consistent with neoplasm. There is extensive mediastinal and hilar lymphadenopathy and bilateral supraclavicular adenopathy. 2. Branching hypermetabolism in the right upper lobe and right lower lobe most consistent with endobronchial spread of tumor. There is also an 8 mm right upper lobe nodule which is hypermetabolic. 3. Diffuse large necrotic hepatic metastasis. 4. Enlarged and hypermetabolic abdominal and pelvic retroperitoneal lymph nodes.   Electronically Signed   By: Loralie Champagne M.D.   On: 08/26/2013 15:08  US Biopsy  08/12/2013   CLINICAL DATA:  Multiple large hepatic lesions and right lower lobe lung mass. The patient presents for liver biopsy.  EXAM: ULTRASOUND GUIDED CORE BIOPSY OF LIVER  MEDICATIONS: 2.0 mg IV Versed; 50 mcg IV Fentanyl  Total Moderate Sedation Time: 19 min  PROCEDURE: The procedure, risks, benefits, and alternatives were explained to the patient. Questions regarding the procedure  were encouraged and answered. The patient understands and consents to the procedure.  The abdominal wall was prepped with Betadine in a sterile fashion, and a sterile drape was applied covering the operative field. A sterile gown and sterile gloves were used for the procedure. Local anesthesia was provided with 1% Lidocaine.  Ultrasound was used to localize liver lesions. Under ultrasound guidance, a 17 gauge needle was advanced into the right lobe of the liver. Core biopsy was performed of a right lobe liver lesion. A total of 3 separate 18 gauge core biopsy samples were obtained and submitted in formalin. As the outer needle was retracted, several Gel-Foam pledgets were injected into the needle tract.  COMPLICATIONS: None.  FINDINGS: Multiple large hepatic masses are seen in both lobes of the liver. A lesion in the right lobe was targeted measuring nearly 5 cm in diameter. Solid tissue was obtained from the lesion.  IMPRESSION: Ultrasound-guided core biopsy performed of one of multiple large hepatic mass lesions.   Electronically Signed   By: Irish Lack M.D.   On: 08/12/2013 17:11   Ct Chest Wo/cm Screening  08/05/2013   CLINICAL DATA:  Strong smoking history.  EXAM: CT CHEST SCREENING WITHOUT CONTRAST  TECHNIQUE: Multidetector CT imaging of the chest was performed following the standard low-dose protocol without IV contrast.  COMPARISON:  None.  FINDINGS: The chest wall is unremarkable. No definite breast masses, supraclavicular or axillary adenopathy. The thyroid gland is grossly normal. The bony thorax is intact. No destructive bone lesions or spinal canal compromise.  The heart is normal in size. No pericardial effusion. Small scattered mediastinal and hilar lymph nodes without obvious adenopathy. There are dense aortic calcifications but no focal aneurysm. The esophagus is grossly normal.  Examination of the lung parenchyma demonstrates moderate emphysematous changes. There is a 2.2 x 2.2 cm right  lower lobe pulmonary lesion consistent with primary bronchogenic carcinoma. There is also obstruction of the medial basilar bronchus of the right lower lobe. This could be obstructed with inflammatory debris, mucus or endobronchial tumor.  Patchy areas of atelectasis are noted. There also small pulmonary nodules. No pleural effusion.  The upper abdomen demonstrates diffuse hepatic metastatic disease. No obvious adrenal lesions.  IMPRESSION: 2.2 x 2.2 cm right lower lobe pulmonary lesion suspicious for primary lung neoplasm.  No obvious mediastinal or hilar lymphadenopathy.  Diffuse hepatic metastatic disease and small scattered pulmonary nodules likely reflecting pulmonary metastatic disease.  Right lower lobe Medial basilar bronchus obstruction possibly by mucoid impaction or tumor.  These results will be called to the ordering clinician or representative by the Radiologist Assistant, and communication documented in the PACS Dashboard.   Electronically Signed   By: Loralie Champagne M.D.   On: 08/05/2013 14:21    ASSESSMENT AND PLAN: This is a very pleasant 62 years old white female recently diagnosed with metastatic non-small cell lung cancer, adenocarcinoma presented with right lower lobe lung mass in addition to mediastinal and supraclavicular lymphadenopathy as well as diffuse metastatic liver lesions and retroperitoneal lymphadenopathy. The molecular biomarker testing are still pending. I discussed the scan results with  the patient and her husband and showed them the images. I gave the patient the option of palliative systemic chemotherapy with carboplatin and Alimta versus palliative care and hospice referral. The patient would like to proceed with treatment. She would be treated with carboplatin for AUC of 5 and Alimta 500 mg/M2 every 3 weeks. I discussed with the patient adverse effect of the chemotherapy including but not limited to alopecia, myelosuppression, nausea and vomiting, peripheral  neuropathy, liver or renal dysfunction. She is expected to start the first cycle of this treatment on 09/02/2013. I will arrange for the patient to have a chemotherapy education class before starting the first dose of her chemotherapy. The patient will receive vitamin B12 injection today. She was given prescription for Compazine 10 mg by mouth every 6 hours as needed for nausea, folic acid 1 mg by mouth daily in addition to Decadron 4 mg by mouth twice a day the day before, day of and day after the chemotherapy every 3 weeks. The patient would come back for follow up visit in 2 weeks for evaluation and management any adverse effect of her treatment. The patient was advised to call immediately if she has any concerning symptoms in the interval. I will continue to monitor the molecular studies closely and if the patient has EGFR mutation or ALK gene translocation I would consider her for target therapy in the future. The patient voices understanding of current disease status and treatment options and is in agreement with the current care plan.  All questions were answered. The patient knows to call the clinic with any problems, questions or concerns. We can certainly see the patient much sooner if necessary.  I spent 20 minutes counseling the patient face to face. The total time spent in the appointment was 30 minutes.

## 2013-08-27 ENCOUNTER — Encounter: Payer: Self-pay | Admitting: Internal Medicine

## 2013-08-27 NOTE — Patient Instructions (Signed)
CURRENT THERAPY: systemic chemotherapy with carboplatin for AUC of 5 and Alimta 500 mg/M2 every 3 weeks. First dose 09/02/2013. DISEASE STAGE:  CHEMOTHERAPY INTENT: palliative  CURRENT # OF CHEMOTHERAPY CYCLES: 1  CURRENT ANTIEMETICS: Zofran, dexamethasone and Compazine.  CURRENT SMOKING STATUS: former smoker  ORAL CHEMOTHERAPY AND CONSENT: None  CURRENT BISPHOSPHONATES USE: None  PAIN MANAGEMENT: 0/10  NARCOTICS INDUCED CONSTIPATION: None  LIVING WILL AND CODE STATUS: Full Code

## 2013-08-28 ENCOUNTER — Encounter: Payer: Self-pay | Admitting: *Deleted

## 2013-08-28 ENCOUNTER — Telehealth: Payer: Self-pay | Admitting: *Deleted

## 2013-08-28 NOTE — Progress Notes (Signed)
Per Dr. Worthy Flank request went on [facility]'s secure website to get pt results.  Gave to Dr. Julien Nordmann.

## 2013-08-28 NOTE — Telephone Encounter (Signed)
Per staff message and POF I have scheduled appts.  JMW  

## 2013-09-01 ENCOUNTER — Encounter: Payer: Self-pay | Admitting: *Deleted

## 2013-09-01 ENCOUNTER — Other Ambulatory Visit: Payer: 59

## 2013-09-02 ENCOUNTER — Ambulatory Visit (HOSPITAL_BASED_OUTPATIENT_CLINIC_OR_DEPARTMENT_OTHER): Payer: 59

## 2013-09-02 ENCOUNTER — Encounter (HOSPITAL_COMMUNITY): Payer: Self-pay

## 2013-09-02 ENCOUNTER — Other Ambulatory Visit (HOSPITAL_BASED_OUTPATIENT_CLINIC_OR_DEPARTMENT_OTHER): Payer: 59

## 2013-09-02 ENCOUNTER — Ambulatory Visit (HOSPITAL_COMMUNITY): Payer: 59

## 2013-09-02 DIAGNOSIS — Z5111 Encounter for antineoplastic chemotherapy: Secondary | ICD-10-CM

## 2013-09-02 DIAGNOSIS — C787 Secondary malignant neoplasm of liver and intrahepatic bile duct: Secondary | ICD-10-CM

## 2013-09-02 DIAGNOSIS — C3430 Malignant neoplasm of lower lobe, unspecified bronchus or lung: Secondary | ICD-10-CM

## 2013-09-02 LAB — CBC WITH DIFFERENTIAL/PLATELET
BASO%: 0.1 % (ref 0.0–2.0)
Basophils Absolute: 0 10*3/uL (ref 0.0–0.1)
EOS%: 0 % (ref 0.0–7.0)
Eosinophils Absolute: 0 10*3/uL (ref 0.0–0.5)
HEMATOCRIT: 37.6 % (ref 34.8–46.6)
HGB: 12.8 g/dL (ref 11.6–15.9)
LYMPH%: 8.7 % — ABNORMAL LOW (ref 14.0–49.7)
MCH: 30.4 pg (ref 25.1–34.0)
MCHC: 34 g/dL (ref 31.5–36.0)
MCV: 89.3 fL (ref 79.5–101.0)
MONO#: 1.3 10*3/uL — AB (ref 0.1–0.9)
MONO%: 8.1 % (ref 0.0–14.0)
NEUT#: 12.9 10*3/uL — ABNORMAL HIGH (ref 1.5–6.5)
NEUT%: 83.1 % — ABNORMAL HIGH (ref 38.4–76.8)
Platelets: 307 10*3/uL (ref 145–400)
RBC: 4.21 10*6/uL (ref 3.70–5.45)
RDW: 13.2 % (ref 11.2–14.5)
WBC: 15.5 10*3/uL — AB (ref 3.9–10.3)
lymph#: 1.4 10*3/uL (ref 0.9–3.3)

## 2013-09-02 LAB — COMPREHENSIVE METABOLIC PANEL (CC13)
ALT: 57 U/L — AB (ref 0–55)
ANION GAP: 13 meq/L — AB (ref 3–11)
AST: 75 U/L — AB (ref 5–34)
Albumin: 3.6 g/dL (ref 3.5–5.0)
Alkaline Phosphatase: 192 U/L — ABNORMAL HIGH (ref 40–150)
BUN: 22.6 mg/dL (ref 7.0–26.0)
CALCIUM: 10.1 mg/dL (ref 8.4–10.4)
CHLORIDE: 106 meq/L (ref 98–109)
CO2: 21 mEq/L — ABNORMAL LOW (ref 22–29)
Creatinine: 0.7 mg/dL (ref 0.6–1.1)
Glucose: 102 mg/dl (ref 70–140)
Potassium: 4 mEq/L (ref 3.5–5.1)
SODIUM: 140 meq/L (ref 136–145)
TOTAL PROTEIN: 7.5 g/dL (ref 6.4–8.3)
Total Bilirubin: 0.39 mg/dL (ref 0.20–1.20)

## 2013-09-02 MED ORDER — SODIUM CHLORIDE 0.9 % IV SOLN
Freq: Once | INTRAVENOUS | Status: AC
  Administered 2013-09-02: 12:00:00 via INTRAVENOUS

## 2013-09-02 MED ORDER — ONDANSETRON 16 MG/50ML IVPB (CHCC)
16.0000 mg | Freq: Once | INTRAVENOUS | Status: AC
  Administered 2013-09-02: 16 mg via INTRAVENOUS

## 2013-09-02 MED ORDER — CARBOPLATIN CHEMO INJECTION 600 MG/60ML
406.0000 mg | Freq: Once | INTRAVENOUS | Status: AC
  Administered 2013-09-02: 410 mg via INTRAVENOUS
  Filled 2013-09-02: qty 41

## 2013-09-02 MED ORDER — DEXAMETHASONE SODIUM PHOSPHATE 20 MG/5ML IJ SOLN
20.0000 mg | Freq: Once | INTRAMUSCULAR | Status: AC
  Administered 2013-09-02: 20 mg via INTRAVENOUS

## 2013-09-02 MED ORDER — DEXAMETHASONE SODIUM PHOSPHATE 20 MG/5ML IJ SOLN
INTRAMUSCULAR | Status: AC
  Filled 2013-09-02: qty 5

## 2013-09-02 MED ORDER — PEMETREXED DISODIUM CHEMO INJECTION 500 MG
500.0000 mg/m2 | Freq: Once | INTRAVENOUS | Status: AC
  Administered 2013-09-02: 725 mg via INTRAVENOUS
  Filled 2013-09-02: qty 29

## 2013-09-02 MED ORDER — ONDANSETRON 16 MG/50ML IVPB (CHCC)
INTRAVENOUS | Status: AC
  Filled 2013-09-02: qty 16

## 2013-09-02 NOTE — Patient Instructions (Signed)
[facility] Discharge Instructions for Patients Receiving Chemotherapy  Today you received the following chemotherapy agents Alimta/Carboplatin.  To help prevent nausea and vomiting after your treatment, we encourage you to take your nausea medication as prescribed.   If you develop nausea and vomiting that is not controlled by your nausea medication, call the clinic.   BELOW ARE SYMPTOMS THAT SHOULD BE REPORTED IMMEDIATELY:  *FEVER GREATER THAN 100.5 F  *CHILLS WITH OR WITHOUT FEVER  NAUSEA AND VOMITING THAT IS NOT CONTROLLED WITH YOUR NAUSEA MEDICATION  *UNUSUAL SHORTNESS OF BREATH  *UNUSUAL BRUISING OR BLEEDING  TENDERNESS IN MOUTH AND THROAT WITH OR WITHOUT PRESENCE OF ULCERS  *URINARY PROBLEMS  *BOWEL PROBLEMS  UNUSUAL RASH Items with * indicate a potential emergency and should be followed up as soon as possible.  Feel free to call the clinic you have any questions or concerns. The clinic phone number is [phone].

## 2013-09-03 ENCOUNTER — Telehealth: Payer: Self-pay | Admitting: *Deleted

## 2013-09-03 NOTE — Telephone Encounter (Signed)
NO PROBLEMS OR QUESTIONS AT THIS TIME. PT. IS EATING AND FORCING FLUIDS. NO NAUSEA OR VOMITING. NO DIARRHEA OR CONSTIPATION. LAST NORMAL BOWEL MOVEMENT WAS YESTERDAY. NO MOUTH ISSUES. PT. HAS CHEMOTHERAPY MEDICATION SHEETS FOR REFERENCE. PT. WILL CALL THIS OFFICE OR THE ON CALL PHYSICIAN SHOULD ANY PROBLEMS ARISE.

## 2013-09-03 NOTE — Telephone Encounter (Signed)
Called pt at home and left message on voice mail for pt to call triage nurse back for post chemo follow up call.

## 2013-09-04 ENCOUNTER — Telehealth: Payer: Self-pay | Admitting: *Deleted

## 2013-09-04 NOTE — Telephone Encounter (Signed)
I spoke with Serafina Royals, PT yesterday.  We spoke out follow up for pt regarding more PT.  I called to follow up and left vm message to call me with name and phone number

## 2013-09-08 ENCOUNTER — Ambulatory Visit (HOSPITAL_BASED_OUTPATIENT_CLINIC_OR_DEPARTMENT_OTHER): Payer: 59 | Admitting: Physician Assistant

## 2013-09-08 ENCOUNTER — Encounter: Payer: Self-pay | Admitting: Physician Assistant

## 2013-09-08 ENCOUNTER — Telehealth: Payer: Self-pay | Admitting: Internal Medicine

## 2013-09-08 ENCOUNTER — Other Ambulatory Visit (HOSPITAL_BASED_OUTPATIENT_CLINIC_OR_DEPARTMENT_OTHER): Payer: 59

## 2013-09-08 VITALS — BP 123/56 | HR 57 | Temp 98.0°F | Resp 18 | Ht 62.0 in | Wt 108.7 lb

## 2013-09-08 DIAGNOSIS — C3490 Malignant neoplasm of unspecified part of unspecified bronchus or lung: Secondary | ICD-10-CM

## 2013-09-08 DIAGNOSIS — C787 Secondary malignant neoplasm of liver and intrahepatic bile duct: Secondary | ICD-10-CM

## 2013-09-08 DIAGNOSIS — R599 Enlarged lymph nodes, unspecified: Secondary | ICD-10-CM

## 2013-09-08 DIAGNOSIS — C3430 Malignant neoplasm of lower lobe, unspecified bronchus or lung: Secondary | ICD-10-CM

## 2013-09-08 LAB — CBC WITH DIFFERENTIAL/PLATELET
BASO%: 0.4 % (ref 0.0–2.0)
BASOS ABS: 0 10*3/uL (ref 0.0–0.1)
EOS%: 4.9 % (ref 0.0–7.0)
Eosinophils Absolute: 0.3 10*3/uL (ref 0.0–0.5)
HCT: 38 % (ref 34.8–46.6)
HEMOGLOBIN: 12.8 g/dL (ref 11.6–15.9)
LYMPH#: 2.2 10*3/uL (ref 0.9–3.3)
LYMPH%: 39.4 % (ref 14.0–49.7)
MCH: 30.9 pg (ref 25.1–34.0)
MCHC: 33.6 g/dL (ref 31.5–36.0)
MCV: 92.1 fL (ref 79.5–101.0)
MONO#: 0.4 10*3/uL (ref 0.1–0.9)
MONO%: 7.7 % (ref 0.0–14.0)
NEUT#: 2.6 10*3/uL (ref 1.5–6.5)
NEUT%: 47.6 % (ref 38.4–76.8)
Platelets: 326 10*3/uL (ref 145–400)
RBC: 4.13 10*6/uL (ref 3.70–5.45)
RDW: 13.2 % (ref 11.2–14.5)
WBC: 5.5 10*3/uL (ref 3.9–10.3)

## 2013-09-08 LAB — COMPREHENSIVE METABOLIC PANEL (CC13)
ALK PHOS: 159 U/L — AB (ref 40–150)
ALT: 69 U/L — AB (ref 0–55)
AST: 63 U/L — ABNORMAL HIGH (ref 5–34)
Albumin: 3.2 g/dL — ABNORMAL LOW (ref 3.5–5.0)
Anion Gap: 9 mEq/L (ref 3–11)
BUN: 15.6 mg/dL (ref 7.0–26.0)
CHLORIDE: 102 meq/L (ref 98–109)
CO2: 26 mEq/L (ref 22–29)
CREATININE: 0.7 mg/dL (ref 0.6–1.1)
Calcium: 9.8 mg/dL (ref 8.4–10.4)
Glucose: 82 mg/dl (ref 70–140)
POTASSIUM: 4.6 meq/L (ref 3.5–5.1)
Sodium: 138 mEq/L (ref 136–145)
Total Bilirubin: 0.78 mg/dL (ref 0.20–1.20)
Total Protein: 6.7 g/dL (ref 6.4–8.3)

## 2013-09-08 NOTE — Telephone Encounter (Signed)
gv adn printed appt sched and avs for ptofr Jan 2015.Marland Kitchen

## 2013-09-08 NOTE — Patient Instructions (Signed)
Continue weekly labs as scheduled Follow up in 2 weeks

## 2013-09-08 NOTE — Progress Notes (Addendum)
[facility] [phone]   Fax:([phone]) [place] [address] [place] [address]  DIAGNOSIS: Stage IV (T1b, N2, M1b) non-small cell lung cancer, adenocarcinoma, presented with a right lower lobe lung mass as well as mediastinal, hilar, bilateral supraclavicular lymphadenopathy in addition to extensive liver metastases and retroperitoneal lymphadenopathy diagnosed in December of 2014.  MOLECULAR BIOMARKERS: [facility] ONE biomarker testing revealed  TP53 E204*, KEAP1 R638f*25+, NFKBIA amplification, NKX2-1 amplification, SMARCA4 E463*. The patient was negative for EGFR and ALK gene translocation   PRIOR THERAPY: None   CURRENT THERAPY: systemic chemotherapy with carboplatin for AUC of 5 and Alimta 500 mg/M2 every 3 weeks. First dose 09/02/2013. DISEASE STAGE:  CHEMOTHERAPY INTENT: palliative  CURRENT # OF CHEMOTHERAPY CYCLES: 1  CURRENT ANTIEMETICS: Zofran, dexamethasone and Compazine.  CURRENT SMOKING STATUS: former smoker  ORAL CHEMOTHERAPY AND CONSENT: None  CURRENT BISPHOSPHONATES USE: None  PAIN MANAGEMENT: 0/10  NARCOTICS INDUCED CONSTIPATION: None  LIVING WILL AND CODE STATUS: Full Code  INTERVAL HISTORY: Amber MIYAZAKI89y.o. female returns to the clinic today for a symptom management visit accompanied by her husband. She completed 1 cycle of her systemic chemotherapy with carboplatin and Alimta and overall tolerated the first cycle relatively well. She did have some nausea and malaise that said in about 3 or 4 days after chemotherapy. The nausea was well-controlled with Compazine. She also has had some significant back pain and has taken Roxicodone for that with some relief. She's not had to repeat the oxycodone in the back pain has resolved. She's also noticed decreased appetite and tries to get in one Ensure dietary supplement a day. Her fluid intake is adequate. [place] This will impact her third cycle of chemotherapy which will have to be pushed to 10/21/2013 instead of 10/14/2013. She voiced no other specific complaints today.  She denied having any significant chest pain, shortness breath, cough or hemoptysis. The patient denied having any significant recent weight loss or night sweats.   MEDICAL HISTORY: Past Medical History  Diagnosis Date  . HTN (hypertension)     ALLERGIES:  is allergic to hctz and plendil.  MEDICATIONS:  Current Outpatient Prescriptions  Medication Sig Dispense Refill  . amLODipine (NORVASC) 10 MG tablet Take 10 mg by mouth daily.      .Marland Kitchenaspirin EC 81 MG tablet Take 81 mg by mouth daily.      . calcium carbonate (TUMS - DOSED IN MG ELEMENTAL CALCIUM) 500 MG chewable tablet Chew 1 tablet by mouth daily as needed for indigestion or heartburn.      . dexamethasone (DECADRON) 4 MG tablet 4 milligram by mouth twice a day the day before, day of and day after the chemotherapy every 3 weeks.  40 tablet  1  . folic acid (FOLVITE) 1 MG tablet Take 1 tablet (1 mg total) by mouth daily.  30 tablet  4  . lisinopril (PRINIVIL,ZESTRIL) 20 MG tablet Take 20 mg by mouth daily.      . nebivolol (BYSTOLIC) 10 MG tablet Take 10 mg by mouth daily.      .Marland KitchenoxyCODONE (ROXICODONE) 5 MG immediate release tablet Take 1 tablet (5 mg total) by mouth every 6 (six) hours as needed for severe pain.  60 tablet  0  . prochlorperazine (COMPAZINE) 10 MG tablet Take 1 tablet (  10 mg total) by mouth every 6 (six) hours as needed for nausea or vomiting.  60 tablet  0   No current facility-administered medications for this visit.    REVIEW OF SYSTEMS:  Constitutional: positive for anorexia, fatigue and malaise Eyes: negative Ears, nose, mouth, throat, and face: negative Respiratory: negative Cardiovascular: negative Gastrointestinal: positive for  nausea Genitourinary:negative Integument/breast: negative Hematologic/lymphatic: negative Musculoskeletal:negative Neurological: negative Behavioral/Psych: negative Endocrine: negative Allergic/Immunologic: negative   PHYSICAL EXAMINATION: General appearance: alert, cooperative and no distress Head: Normocephalic, without obvious abnormality, atraumatic Neck: no adenopathy, no JVD, supple, symmetrical, trachea midline and thyroid not enlarged, symmetric, no tenderness/mass/nodules Lymph nodes: Cervical, supraclavicular, and axillary nodes normal. Resp: clear to auscultation bilaterally Back: symmetric, no curvature. ROM normal. No CVA tenderness. Cardio: regular rate and rhythm, S1, S2 normal, no murmur, click, rub or gallop GI: soft, non-tender; bowel sounds normal; no masses,  no organomegaly Extremities: extremities normal, atraumatic, no cyanosis or edema Neurologic: Alert and oriented X 3, normal strength and tone. Normal symmetric reflexes. Normal coordination and gait  ECOG PERFORMANCE STATUS: 1 - Symptomatic but completely ambulatory  Blood pressure 123/56, pulse 57, temperature 98 F (36.7 C), temperature source Oral, resp. rate 18, height 5' 2"  (1.575 m), weight 108 lb 11.2 oz (49.306 kg).  LABORATORY DATA: Lab Results  Component Value Date   WBC 5.5 09/08/2013   HGB 12.8 09/08/2013   HCT 38.0 09/08/2013   MCV 92.1 09/08/2013   PLT 326 09/08/2013      Chemistry      Component Value Date/Time   NA 138 09/08/2013 1119   K 4.6 09/08/2013 1119   CO2 26 09/08/2013 1119   BUN 15.6 09/08/2013 1119   CREATININE 0.7 09/08/2013 1119   CREATININE 0.80 2013-09-12 0800      Component Value Date/Time   CALCIUM 9.8 09/08/2013 1119       RADIOGRAPHIC STUDIES: Mr Jeri Cos WC Contrast  09-12-2013   CLINICAL DATA:  New diagnosis lung cancer. Headache. Rule out metastatic disease.  EXAM: MRI HEAD WITHOUT AND WITH CONTRAST  TECHNIQUE: Multiplanar, multiecho pulse sequences of the  brain and surrounding structures were obtained without and with intravenous contrast.  CONTRAST:  23m MULTIHANCE GADOBENATE DIMEGLUMINE 529 MG/ML IV SOLN  COMPARISON:  None.  FINDINGS: Ventricle size is normal. Patchy hyperintensities in the cerebral white matter bilaterally with the appearance of chronic microvascular ischemia. No acute infarct. Negative for hemorrhage.  Negative for mass or edema. Postcontrast imaging reveals no enhancing lesions in the brain. No metastatic deposits are identified. No lesions in the calvarium are identified.  Mild mucosal edema in the paranasal sinuses.  IMPRESSION: Negative for metastatic disease.  Mild chronic microvascular ischemic changes in the white matter.   Electronically Signed   By: CFranchot GalloM.D.   On: 1January 17, 201508:48   [place] Tissue Head/neck  08/05/2013   CLINICAL DATA:  Follow-up thyroid nodule  EXAM: THYROID ULTRASOUND  TECHNIQUE: Ultrasound examination of the thyroid gland and adjacent soft tissues was performed.  COMPARISON:  07/30/2012  FINDINGS: Right thyroid lobe  Measurements: 4.1 x 1.2 x 1.6 cm. 5 mm diameter nodule at upper pole. Small shadowing calcifications within mid right lobe, 3 mm and 4 mm in size, not significantly changed.  Left thyroid lobe  Measurements: 3.7 x 1.1 x 1.4 cm. Hypoechoic nodule mid left lobe 7 x 3 x 5 mm, not significantly changed.  Isthmus  Thickness: 2 mm thick.  No nodules visualized.  Lymphadenopathy  None visualized.  IMPRESSION: Stable small bilateral thyroid nodules and right thyroid calcifications.  Findings do not meet current SRU consensus criteria for biopsy. Follow-up by clinical exam is recommended. If patient has known risk factors for thyroid carcinoma, consider follow-up ultrasound in 12 months. If patient is clinically hyperthyroid, consider nuclear medicine thyroid uptake and scan.Reference: Management of Thyroid Nodules Detected at [place]: Society of Radiologists in [place]. Radiology 2005; [id number].   Electronically Signed   By: Lavonia Dana M.D.   On: 08/05/2013 13:10   Ct Abdomen Pelvis W Contrast  08/07/2013   CLINICAL DATA:  Lung cancer with liver lesions on chest CT.  EXAM: CT ABDOMEN AND PELVIS WITH CONTRAST  TECHNIQUE: Multidetector CT imaging of the abdomen and pelvis was performed using the standard protocol following bolus administration of intravenous contrast.  CONTRAST:  181m OMNIPAQUE IOHEXOL 300 MG/ML  SOLN  COMPARISON:  Abdominal CT 04/05/2004.  Chest CT 08/05/2013.  FINDINGS: The visualized lung bases are stable with patchy right infrahilar pulmonary opacity. The previously demonstrated right lower lobe mass is not imaged on this examination. There is no significant pleural effusion.  As demonstrated on the recent chest CT, there are multiple large hypoenhancing hepatic metastases. Representative lesions measure 5.6 x 4.6 cm in the posterior segment of the right hepatic lobe (image 14) and 5.4 x 3.9 cm in the lateral segment of the left hepatic lobe (image 16). The liver is mildly enlarged.  There is no adrenal mass. The spleen, gallbladder, pancreas and kidneys appear normal.  There is upper retroperitoneal lymphadenopathy, with a 12 mm short axis left periaortic node on image 27. There is a retrocaval node measuring 11 mm short axis on image 26. No lower abdominal or pelvic adenopathy is seen. There is no ascites or peritoneal nodularity.  There is no evidence of bowel obstruction or focal mucosal lesion. There is uterine myometrial heterogeneity and nodularity consistent with fibroids. A 6.6 x 5.3 cm right adnexal mass appears contiguous with the uterus and similar in density, probably a large exophytic fibroid. The bladder appears normal.  Within the upper sacrum off midline to the right, there is a lucent lesion with central sclerosis measuring 2.0 cm on image 51. The central sclerotic component was present previously, although the surrounding  lucency appears progressive based on comparison with prior axial images. Therefore, a lytic metastasis cannot be excluded. No other focal lesions are identified. There is degenerative disc disease at L5-S1.  IMPRESSION: 1. Widespread hepatic metastatic disease. 2. Upper retroperitoneal lymphadenopathy suspicious for metastases. 3. Increased lucent component of a lesion involving the upper right sacrum, possibly a metastasis. No other osseous metastases identified. 4. Right adnexal mass is new from 2005, but probably an exophytic fibroid.   Electronically Signed   By: BCamie PatienceM.D.   On: 08/07/2013 12:14   Nm Pet Image Initial (pi) Skull Base To Thigh  08/26/2013   CLINICAL DATA:  Initial treatment strategy for lung cancer.  EXAM: NUCLEAR MEDICINE PET SKULL BASE TO THIGH  FASTING BLOOD GLUCOSE:  Value: 960mdl  TECHNIQUE: 14.4 mCi F-18 FDG was injected intravenously. CT data was obtained and used for attenuation correction and anatomic localization only. (This was not acquired as a diagnostic CT examination.) Additional exam technical data entered on technologist worksheet.  COMPARISON:  CT scans 07/2009 and 08/07/2013  FINDINGS: NECK  Bilateral enlarged and hypermetabolic supraclavicular lymph nodes with SUV max of 4.3.  CHEST  Right lower lobe lung lesion is hypermetabolic with SUV max  is 7.5. There are numerous enlarged and hypermetabolic bilateral hilar and bilateral mediastinal lymph nodes. An 8 mm left upper lobe nodular density (on image number 98 of the prior chest CT) is hypermetabolic with SUV max of 3.2. Other smaller bilateral pulmonary nodules are not definitely hypermetabolic but likely metastatic disease. Branching areas of hypermetabolism noted in the right upper lobe and right lower lobe most likely reflect endobronchial spread of tumor or severe bronchitis.  ABDOMEN/PELVIS  Diffuse hepatic metastatic disease with large necrotic metastasis involving both lobes of the liver. SUV max is 12.0.  There are also enlarged and hypermetabolic retroperitoneal lymph nodes in the abdomen and pelvis. The bulky pelvis mass noted on the prior CT scan is most likely a large fibroid. Other smaller fibroids are noted.  SKELETON  No focal hypermetabolic activity to suggest skeletal metastasis.  IMPRESSION: 1. Hypermetabolic right lower lobe lung mass consistent with neoplasm. There is extensive mediastinal and hilar lymphadenopathy and bilateral supraclavicular adenopathy. 2. Branching hypermetabolism in the right upper lobe and right lower lobe most consistent with endobronchial spread of tumor. There is also an 8 mm right upper lobe nodule which is hypermetabolic. 3. Diffuse large necrotic hepatic metastasis. 4. Enlarged and hypermetabolic abdominal and pelvic retroperitoneal lymph nodes.   Electronically Signed   By: Kalman Jewels M.D.   On: 08/26/2013 15:08   US Biopsy  08/12/2013   CLINICAL DATA:  Multiple large hepatic lesions and right lower lobe lung mass. The patient presents for liver biopsy.  EXAM: ULTRASOUND GUIDED CORE BIOPSY OF LIVER  MEDICATIONS: 2.0 mg IV Versed; 50 mcg IV Fentanyl  Total Moderate Sedation Time: 19 min  PROCEDURE: The procedure, risks, benefits, and alternatives were explained to the patient. Questions regarding the procedure were encouraged and answered. The patient understands and consents to the procedure.  The abdominal wall was prepped with Betadine in a sterile fashion, and a sterile drape was applied covering the operative field. A sterile gown and sterile gloves were used for the procedure. Local anesthesia was provided with 1% Lidocaine.  Ultrasound was used to localize liver lesions. Under ultrasound guidance, a 17 gauge needle was advanced into the right lobe of the liver. Core biopsy was performed of a right lobe liver lesion. A total of 3 separate 18 gauge core biopsy samples were obtained and submitted in formalin. As the outer needle was retracted, several Gel-Foam  pledgets were injected into the needle tract.  COMPLICATIONS: None.  FINDINGS: Multiple large hepatic masses are seen in both lobes of the liver. A lesion in the right lobe was targeted measuring nearly 5 cm in diameter. Solid tissue was obtained from the lesion.  IMPRESSION: Ultrasound-guided core biopsy performed of one of multiple large hepatic mass lesions.   Electronically Signed   By: Aletta Edouard M.D.   On: 08/12/2013 17:11   Ct Chest Wo/cm Screening  08/05/2013   CLINICAL DATA:  Strong smoking history.  EXAM: CT CHEST SCREENING WITHOUT CONTRAST  TECHNIQUE: Multidetector CT imaging of the chest was performed following the standard low-dose protocol without IV contrast.  COMPARISON:  None.  FINDINGS: The chest wall is unremarkable. No definite breast masses, supraclavicular or axillary adenopathy. The thyroid gland is grossly normal. The bony thorax is intact. No destructive bone lesions or spinal canal compromise.  The heart is normal in size. No pericardial effusion. Small scattered mediastinal and hilar lymph nodes without obvious adenopathy. There are dense aortic calcifications but no focal aneurysm. The esophagus is grossly normal.  Examination of the lung parenchyma demonstrates moderate emphysematous changes. There is a 2.2 x 2.2 cm right lower lobe pulmonary lesion consistent with primary bronchogenic carcinoma. There is also obstruction of the medial basilar bronchus of the right lower lobe. This could be obstructed with inflammatory debris, mucus or endobronchial tumor.  Patchy areas of atelectasis are noted. There also small pulmonary nodules. No pleural effusion.  The upper abdomen demonstrates diffuse hepatic metastatic disease. No obvious adrenal lesions.  IMPRESSION: 2.2 x 2.2 cm right lower lobe pulmonary lesion suspicious for primary lung neoplasm.  No obvious mediastinal or hilar lymphadenopathy.  Diffuse hepatic metastatic disease and small scattered pulmonary nodules likely  reflecting pulmonary metastatic disease.  Right lower lobe Medial basilar bronchus obstruction possibly by mucoid impaction or tumor.  These results will be called to the ordering clinician or representative by the Radiologist Assistant, and communication documented in the PACS Dashboard.   Electronically Signed   By: Kalman Jewels M.D.   On: 08/05/2013 14:21    ASSESSMENT AND PLAN: This is a very pleasant 63 years old white female recently diagnosed with metastatic non-small cell lung cancer, adenocarcinoma presented with right lower lobe lung mass in addition to mediastinal and supraclavicular lymphadenopathy as well as diffuse metastatic liver lesions and retroperitoneal lymphadenopathy. The molecular biomarker testing were negative for EGFR and ALK gene translocation. Specific positives are listed above. The patient was discussed with and also seen by Dr. Julien Nordmann. Dr. Earlie Server discuss the results of the biomarker testing with the patient and her husband. She will continue on her current systemic chemotherapy of carboplatin at an AUC of 5 and Alimta at 500 mg per meter squared given every 3 weeks. She'll continue with weekly labs consisting of a CBC differential and C. met. She'll return in 2 weeks for another symptom management visit prior to cycle #2. The patient was advised to call immediately if she has any concerning symptoms in the interval. The patient voices understanding of current disease status and treatment options and is in agreement with the current care plan.  All questions were answered. The patient knows to call the clinic with any problems, questions or concerns. We can certainly see the patient much sooner if necessary.  Carlton Adam PA-C  ADDENDUM: Hematology/Oncology Attending: I had the face to face encounter with the patient. I recommended her care plan. The patient is a very pleasant 63 years old white female with stage IV non-small cell carcinoma most likely of lung  primary currently undergoing systemic chemotherapy was carboplatin and Alimta status post 1 cycle. She tolerated the first week of her treatment fairly well with no significant adverse effects. The molecular biomarker showed no actionable mutations, lack EGFR or ALK.  I discussed the study with the patient and her husband today. I recommended for her to continue her current treatment with systemic chemotherapy with carboplatin and Alimta as scheduled. Her next dose is scheduled in 2 weeks. She would come back for followup visit at that time. She was advised to call immediately she has any concerning symptoms in the interval.  Disclaimer: This note was dictated with voice recognition software. Similar sounding words can inadvertently be transcribed and may not be corrected upon review. Eilleen Kempf., MD 09/09/2013

## 2013-09-16 ENCOUNTER — Other Ambulatory Visit (HOSPITAL_BASED_OUTPATIENT_CLINIC_OR_DEPARTMENT_OTHER): Payer: 59

## 2013-09-16 DIAGNOSIS — C3430 Malignant neoplasm of lower lobe, unspecified bronchus or lung: Secondary | ICD-10-CM

## 2013-09-16 DIAGNOSIS — C787 Secondary malignant neoplasm of liver and intrahepatic bile duct: Secondary | ICD-10-CM

## 2013-09-16 LAB — COMPREHENSIVE METABOLIC PANEL (CC13)
ALK PHOS: 195 U/L — AB (ref 40–150)
ALT: 62 U/L — ABNORMAL HIGH (ref 0–55)
AST: 66 U/L — AB (ref 5–34)
Albumin: 3.3 g/dL — ABNORMAL LOW (ref 3.5–5.0)
Anion Gap: 8 mEq/L (ref 3–11)
BUN: 13.1 mg/dL (ref 7.0–26.0)
CO2: 27 mEq/L (ref 22–29)
CREATININE: 0.7 mg/dL (ref 0.6–1.1)
Calcium: 10 mg/dL (ref 8.4–10.4)
Chloride: 102 mEq/L (ref 98–109)
GLUCOSE: 90 mg/dL (ref 70–140)
Potassium: 4.4 mEq/L (ref 3.5–5.1)
Sodium: 137 mEq/L (ref 136–145)
Total Bilirubin: 0.44 mg/dL (ref 0.20–1.20)
Total Protein: 6.8 g/dL (ref 6.4–8.3)

## 2013-09-16 LAB — CBC WITH DIFFERENTIAL/PLATELET
BASO%: 0.2 % (ref 0.0–2.0)
BASOS ABS: 0 10*3/uL (ref 0.0–0.1)
EOS ABS: 0.1 10*3/uL (ref 0.0–0.5)
EOS%: 1.2 % (ref 0.0–7.0)
HEMATOCRIT: 36.5 % (ref 34.8–46.6)
HEMOGLOBIN: 12.3 g/dL (ref 11.6–15.9)
LYMPH%: 34.7 % (ref 14.0–49.7)
MCH: 30.4 pg (ref 25.1–34.0)
MCHC: 33.7 g/dL (ref 31.5–36.0)
MCV: 90.1 fL (ref 79.5–101.0)
MONO#: 0.7 10*3/uL (ref 0.1–0.9)
MONO%: 11.9 % (ref 0.0–14.0)
NEUT%: 52 % (ref 38.4–76.8)
NEUTROS ABS: 3.1 10*3/uL (ref 1.5–6.5)
PLATELETS: 249 10*3/uL (ref 145–400)
RBC: 4.05 10*6/uL (ref 3.70–5.45)
RDW: 13.1 % (ref 11.2–14.5)
WBC: 5.9 10*3/uL (ref 3.9–10.3)
lymph#: 2 10*3/uL (ref 0.9–3.3)

## 2013-09-16 LAB — TECHNOLOGIST REVIEW

## 2013-09-17 ENCOUNTER — Encounter: Payer: Self-pay | Admitting: *Deleted

## 2013-09-17 NOTE — Progress Notes (Signed)
[facility] Psychosocial Distress Screening Clinical Social Work  Clinical Social Work was referred by distress screening protocol.  The patient scored a 5 on the Psychosocial Distress Thermometer which indicates moderate distress. Clinical Social Worker attempted to contact patient to assess for distress and other psychosocial needs. CSW unable to reach patient, CSW left voicemail to return call when convenient.  Clinical Social Worker follow up needed: yes  If yes, follow up plan: CSw will await patient call   Polo Riley, MSW, LCSW, OSW-C Clinical Social Worker [facility] [phone]

## 2013-09-17 NOTE — Progress Notes (Signed)
Patient returned CSW call.  Patient states she is adjusting well to her first chemotherapy treatment and is ready to begin next cycle.  Mrs. Comley had questions regarding healthcare advance directives and how to donate body to science.  CSW briefly discussed with patient and plans to follow up with patient further at next chemotherapy appointment.  Polo Riley, MSW, LCSW, OSW-C Clinical Social Worker [facility] [phone]

## 2013-09-23 ENCOUNTER — Other Ambulatory Visit: Payer: 59

## 2013-09-23 ENCOUNTER — Encounter: Payer: Self-pay | Admitting: Internal Medicine

## 2013-09-23 ENCOUNTER — Other Ambulatory Visit (HOSPITAL_BASED_OUTPATIENT_CLINIC_OR_DEPARTMENT_OTHER): Payer: 59

## 2013-09-23 ENCOUNTER — Telehealth: Payer: Self-pay | Admitting: Internal Medicine

## 2013-09-23 ENCOUNTER — Ambulatory Visit (HOSPITAL_BASED_OUTPATIENT_CLINIC_OR_DEPARTMENT_OTHER): Payer: 59

## 2013-09-23 ENCOUNTER — Ambulatory Visit (HOSPITAL_BASED_OUTPATIENT_CLINIC_OR_DEPARTMENT_OTHER): Payer: 59 | Admitting: Internal Medicine

## 2013-09-23 VITALS — BP 130/62 | HR 63 | Temp 97.3°F | Resp 18 | Ht 62.0 in | Wt 107.9 lb

## 2013-09-23 DIAGNOSIS — C3430 Malignant neoplasm of lower lobe, unspecified bronchus or lung: Secondary | ICD-10-CM

## 2013-09-23 DIAGNOSIS — Z5111 Encounter for antineoplastic chemotherapy: Secondary | ICD-10-CM

## 2013-09-23 DIAGNOSIS — C3490 Malignant neoplasm of unspecified part of unspecified bronchus or lung: Secondary | ICD-10-CM

## 2013-09-23 DIAGNOSIS — I10 Essential (primary) hypertension: Secondary | ICD-10-CM

## 2013-09-23 DIAGNOSIS — C787 Secondary malignant neoplasm of liver and intrahepatic bile duct: Secondary | ICD-10-CM

## 2013-09-23 LAB — COMPREHENSIVE METABOLIC PANEL (CC13)
ALT: 57 U/L — AB (ref 0–55)
ANION GAP: 10 meq/L (ref 3–11)
AST: 69 U/L — ABNORMAL HIGH (ref 5–34)
Albumin: 3.5 g/dL (ref 3.5–5.0)
Alkaline Phosphatase: 190 U/L — ABNORMAL HIGH (ref 40–150)
BUN: 15.2 mg/dL (ref 7.0–26.0)
CALCIUM: 10.2 mg/dL (ref 8.4–10.4)
CHLORIDE: 103 meq/L (ref 98–109)
CO2: 22 meq/L (ref 22–29)
CREATININE: 0.7 mg/dL (ref 0.6–1.1)
Glucose: 104 mg/dl (ref 70–140)
Potassium: 4.6 mEq/L (ref 3.5–5.1)
SODIUM: 135 meq/L — AB (ref 136–145)
TOTAL PROTEIN: 7.3 g/dL (ref 6.4–8.3)
Total Bilirubin: 0.33 mg/dL (ref 0.20–1.20)

## 2013-09-23 LAB — CBC WITH DIFFERENTIAL/PLATELET
BASO%: 0.1 % (ref 0.0–2.0)
Basophils Absolute: 0 10*3/uL (ref 0.0–0.1)
EOS%: 0 % (ref 0.0–7.0)
Eosinophils Absolute: 0 10*3/uL (ref 0.0–0.5)
HEMATOCRIT: 34.8 % (ref 34.8–46.6)
HGB: 11.9 g/dL (ref 11.6–15.9)
LYMPH#: 2 10*3/uL (ref 0.9–3.3)
LYMPH%: 13.8 % — ABNORMAL LOW (ref 14.0–49.7)
MCH: 30.2 pg (ref 25.1–34.0)
MCHC: 34.2 g/dL (ref 31.5–36.0)
MCV: 88.3 fL (ref 79.5–101.0)
MONO#: 1.6 10*3/uL — AB (ref 0.1–0.9)
MONO%: 11.2 % (ref 0.0–14.0)
NEUT%: 74.9 % (ref 38.4–76.8)
NEUTROS ABS: 10.6 10*3/uL — AB (ref 1.5–6.5)
Platelets: 431 10*3/uL — ABNORMAL HIGH (ref 145–400)
RBC: 3.94 10*6/uL (ref 3.70–5.45)
RDW: 13.3 % (ref 11.2–14.5)
WBC: 14.2 10*3/uL — AB (ref 3.9–10.3)

## 2013-09-23 MED ORDER — DEXAMETHASONE SODIUM PHOSPHATE 20 MG/5ML IJ SOLN
20.0000 mg | Freq: Once | INTRAMUSCULAR | Status: AC
  Administered 2013-09-23: 20 mg via INTRAVENOUS

## 2013-09-23 MED ORDER — SODIUM CHLORIDE 0.9 % IV SOLN
Freq: Once | INTRAVENOUS | Status: AC
  Administered 2013-09-23: 12:00:00 via INTRAVENOUS

## 2013-09-23 MED ORDER — SODIUM CHLORIDE 0.9 % IJ SOLN
10.0000 mL | INTRAMUSCULAR | Status: DC | PRN
  Filled 2013-09-23: qty 10

## 2013-09-23 MED ORDER — SODIUM CHLORIDE 0.9 % IV SOLN
446.0000 mg | Freq: Once | INTRAVENOUS | Status: AC
  Administered 2013-09-23: 450 mg via INTRAVENOUS
  Filled 2013-09-23: qty 45

## 2013-09-23 MED ORDER — ONDANSETRON 16 MG/50ML IVPB (CHCC)
INTRAVENOUS | Status: AC
  Filled 2013-09-23: qty 16

## 2013-09-23 MED ORDER — HEPARIN SOD (PORK) LOCK FLUSH 100 UNIT/ML IV SOLN
500.0000 [IU] | Freq: Once | INTRAVENOUS | Status: DC | PRN
  Filled 2013-09-23: qty 5

## 2013-09-23 MED ORDER — DEXAMETHASONE SODIUM PHOSPHATE 20 MG/5ML IJ SOLN
INTRAMUSCULAR | Status: AC
  Filled 2013-09-23: qty 5

## 2013-09-23 MED ORDER — ONDANSETRON 16 MG/50ML IVPB (CHCC)
16.0000 mg | Freq: Once | INTRAVENOUS | Status: AC
  Administered 2013-09-23: 16 mg via INTRAVENOUS

## 2013-09-23 MED ORDER — PEMETREXED DISODIUM CHEMO INJECTION 500 MG
500.0000 mg/m2 | Freq: Once | INTRAVENOUS | Status: AC
  Administered 2013-09-23: 725 mg via INTRAVENOUS
  Filled 2013-09-23: qty 29

## 2013-09-23 NOTE — Patient Instructions (Signed)
Smoking Cessation Quitting smoking is important to your health and has many advantages. However, it is not always easy to quit since nicotine is a very addictive drug. Often times, people try 3 times or more before being able to quit. This document explains the best ways for you to prepare to quit smoking. Quitting takes hard work and a lot of effort, but you can do it. ADVANTAGES OF QUITTING SMOKING  You will live longer, feel better, and live better.  Your body will feel the impact of quitting smoking almost immediately.  Within 20 minutes, blood pressure decreases. Your pulse returns to its normal level.  After 8 hours, carbon monoxide levels in the blood return to normal. Your oxygen level increases.  After 24 hours, the chance of having a heart attack starts to decrease. Your breath, hair, and body stop smelling like smoke.  After 48 hours, damaged nerve endings begin to recover. Your sense of taste and smell improve.  After 72 hours, the body is virtually free of nicotine. Your bronchial tubes relax and breathing becomes easier.  After 2 to 12 weeks, lungs can hold more air. Exercise becomes easier and circulation improves.  The risk of having a heart attack, stroke, cancer, or lung disease is greatly reduced.  After 1 year, the risk of coronary heart disease is cut in half.  After 5 years, the risk of stroke falls to the same as a nonsmoker.  After 10 years, the risk of lung cancer is cut in half and the risk of other cancers decreases significantly.  After 15 years, the risk of coronary heart disease drops, usually to the level of a nonsmoker.  If you are pregnant, quitting smoking will improve your chances of having a healthy baby.  The people you live with, especially any children, will be healthier.  You will have extra money to spend on things other than cigarettes. QUESTIONS TO THINK ABOUT BEFORE ATTEMPTING TO QUIT You may want to talk about your answers with your  caregiver.  Why do you want to quit?  If you tried to quit in the past, what helped and what did not?  What will be the most difficult situations for you after you quit? How will you plan to handle them?  Who can help you through the tough times? Your family? Friends? A caregiver?  What pleasures do you get from smoking? What ways can you still get pleasure if you quit? Here are some questions to ask your caregiver:  How can you help me to be successful at quitting?  What medicine do you think would be best for me and how should I take it?  What should I do if I need more help?  What is smoking withdrawal like? How can I get information on withdrawal? GET READY  Set a quit date.  Change your environment by getting rid of all cigarettes, ashtrays, matches, and lighters in your home, car, or work. Do not let people smoke in your home.  Review your past attempts to quit. Think about what worked and what did not. GET SUPPORT AND ENCOURAGEMENT You have a better chance of being successful if you have help. You can get support in many ways.  Tell your family, friends, and co-workers that you are going to quit and need their support. Ask them not to smoke around you.  Get individual, group, or telephone counseling and support. Programs are available at local hospitals and health centers. Call your local health department for   information about programs in your area.  Spiritual beliefs and practices may help some smokers quit.  Download a "quit meter" on your computer to keep track of quit statistics, such as how long you have gone without smoking, cigarettes not smoked, and money saved.  Get a self-help book about quitting smoking and staying off of tobacco. LEARN NEW SKILLS AND BEHAVIORS  Distract yourself from urges to smoke. Talk to someone, go for a walk, or occupy your time with a task.  Change your normal routine. Take a different route to work. Drink tea instead of coffee.  Eat breakfast in a different place.  Reduce your stress. Take a hot bath, exercise, or read a book.  Plan something enjoyable to do every day. Reward yourself for not smoking.  Explore interactive web-based programs that specialize in helping you quit. GET MEDICINE AND USE IT CORRECTLY Medicines can help you stop smoking and decrease the urge to smoke. Combining medicine with the above behavioral methods and support can greatly increase your chances of successfully quitting smoking.  Nicotine replacement therapy helps deliver nicotine to your body without the negative effects and risks of smoking. Nicotine replacement therapy includes nicotine gum, lozenges, inhalers, nasal sprays, and skin patches. Some may be available over-the-counter and others require a prescription.  Antidepressant medicine helps people abstain from smoking, but how this works is unknown. This medicine is available by prescription.  Nicotinic receptor partial agonist medicine simulates the effect of nicotine in your brain. This medicine is available by prescription. Ask your caregiver for advice about which medicines to use and how to use them based on your health history. Your caregiver will tell you what side effects to look out for if you choose to be on a medicine or therapy. Carefully read the information on the package. Do not use any other product containing nicotine while using a nicotine replacement product.  RELAPSE OR DIFFICULT SITUATIONS Most relapses occur within the first 3 months after quitting. Do not be discouraged if you start smoking again. Remember, most people try several times before finally quitting. You may have symptoms of withdrawal because your body is used to nicotine. You may crave cigarettes, be irritable, feel very hungry, cough often, get headaches, or have difficulty concentrating. The withdrawal symptoms are only temporary. They are strongest when you first quit, but they will go away within  10 14 days. To reduce the chances of relapse, try to:  Avoid drinking alcohol. Drinking lowers your chances of successfully quitting.  Reduce the amount of caffeine you consume. Once you quit smoking, the amount of caffeine in your body increases and can give you symptoms, such as a rapid heartbeat, sweating, and anxiety.  Avoid smokers because they can make you want to smoke.  Do not let weight gain distract you. Many smokers will gain weight when they quit, usually less than 10 pounds. Eat a healthy diet and stay active. You can always lose the weight gained after you quit.  Find ways to improve your mood other than smoking. FOR MORE INFORMATION  www.smokefree.gov  Document Released: 08/07/2001 Document Revised: 02/12/2012 Document Reviewed: 11/22/2011 [employer] Patient Information 2014 [employer].  

## 2013-09-23 NOTE — Telephone Encounter (Signed)
gv pt appt schedule for feb/march. Per pt no wkly lbs 2/11 and 2/18 due to she will be on vac. Also per 1/28 pof delay next chemo by 1 wk - chemo toady and then 2/25.

## 2013-09-23 NOTE — Progress Notes (Signed)
[place] [phone]   [phone]  OFFICE PROGRESS NOTE  Mathews Argyle, MD [address] [place] [address]  DIAGNOSIS: Stage IV (T1b, N2, M1b) non-small cell lung cancer, adenocarcinoma, presented with a right lower lobe lung mass as well as mediastinal, hilar, bilateral supraclavicular lymphadenopathy in addition to extensive liver metastases and retroperitoneal lymphadenopathy diagnosed in December of 2014.  MOLECULAR BIOMARKERS: Foundation ONE biomarker testing revealed TP53 E204*, KEAP1 R696f*25+, NFKBIA amplification, NKX2-1 amplification, SMARCA4 E463*. The patient was negative for EGFR and ALK gene translocation   PRIOR THERAPY: None   CURRENT THERAPY: Systemic chemotherapy with carboplatin for AUC of 5 and Alimta 500 mg/M2 every 3 weeks, status post 1 cycle. First dose 09/02/2013.   CHEMOTHERAPY INTENT: palliative  CURRENT # OF CHEMOTHERAPY CYCLES: 2  CURRENT ANTIEMETICS: Zofran, dexamethasone and Compazine.  CURRENT SMOKING STATUS: former smoker  ORAL CHEMOTHERAPY AND CONSENT: None  CURRENT BISPHOSPHONATES USE: None  PAIN MANAGEMENT: 0/10  NARCOTICS INDUCED CONSTIPATION: None  LIVING WILL AND CODE STATUS: Full Code  INTERVAL HISTORY: Amber [id number].o. female returns to the clinic today for follow up visit accompanied by her husband. The patient is feeling fine today with no specific complaints. She tolerated the first cycle of her treatment fairly well with no significant adverse effects. The patient denied having any significant nausea or vomiting. She denied having any fever or chills. She has no significant weight loss or night sweats. She denied having any significant chest pain, shortness of breath, cough or hemoptysis. She is here today to start cycle #2 of her chemotherapy.  MEDICAL HISTORY: Past Medical History  Diagnosis Date  . HTN (hypertension)     ALLERGIES:  is allergic to hctz  and plendil.  MEDICATIONS:  Current Outpatient Prescriptions  Medication Sig Dispense Refill  . amLODipine (NORVASC) 10 MG tablet Take 10 mg by mouth daily.      .Marland Kitchenaspirin EC 81 MG tablet Take 81 mg by mouth daily.      . calcium carbonate (TUMS - DOSED IN MG ELEMENTAL CALCIUM) 500 MG chewable tablet Chew 1 tablet by mouth daily as needed for indigestion or heartburn.      . dexamethasone (DECADRON) 4 MG tablet 4 milligram by mouth twice a day the day before, day of and day after the chemotherapy every 3 weeks.  40 tablet  1  . folic acid (FOLVITE) 1 MG tablet Take 1 tablet (1 mg total) by mouth daily.  30 tablet  4  . lisinopril (PRINIVIL,ZESTRIL) 20 MG tablet Take 20 mg by mouth daily.      . nebivolol (BYSTOLIC) 10 MG tablet Take 10 mg by mouth daily.      .Marland KitchenoxyCODONE (ROXICODONE) 5 MG immediate release tablet Take 1 tablet (5 mg total) by mouth every 6 (six) hours as needed for severe pain.  60 tablet  0  . prochlorperazine (COMPAZINE) 10 MG tablet Take 1 tablet (10 mg total) by mouth every 6 (six) hours as needed for nausea or vomiting.  60 tablet  0   No current facility-administered medications for this visit.    REVIEW OF SYSTEMS:  Constitutional: negative Eyes: negative Ears, nose, mouth, throat, and face: negative Respiratory: negative Cardiovascular: negative Gastrointestinal: positive for abdominal pain Genitourinary:negative Integument/breast: negative Hematologic/lymphatic: negative Musculoskeletal:negative Neurological: negative Behavioral/Psych: negative Endocrine: negative Allergic/Immunologic: negative   PHYSICAL EXAMINATION: General appearance: alert, cooperative and no distress Head: Normocephalic, without obvious abnormality, atraumatic Neck:  no adenopathy, no JVD, supple, symmetrical, trachea midline and thyroid not enlarged, symmetric, no tenderness/mass/nodules Lymph nodes: Cervical, supraclavicular, and axillary nodes normal. Resp: clear to auscultation  bilaterally Back: symmetric, no curvature. ROM normal. No CVA tenderness. Cardio: regular rate and rhythm, S1, S2 normal, no murmur, click, rub or gallop GI: soft, non-tender; bowel sounds normal; no masses,  no organomegaly Extremities: extremities normal, atraumatic, no cyanosis or edema Neurologic: Alert and oriented X 3, normal strength and tone. Normal symmetric reflexes. Normal coordination and gait  ECOG PERFORMANCE STATUS: 1 - Symptomatic but completely ambulatory  Blood pressure 130/62, pulse 63, temperature 97.3 F (36.3 C), temperature source Oral, resp. rate 18, height 5' 2" (1.575 m), weight 107 lb 14.4 oz (48.943 kg).  LABORATORY DATA: Lab Results  Component Value Date   WBC 14.2* 09/23/2013   HGB 11.9 09/23/2013   HCT 34.8 09/23/2013   MCV 88.3 09/23/2013   PLT 431* 09/23/2013      Chemistry      Component Value Date/Time   NA 137 09/16/2013 1121   K 4.4 09/16/2013 1121   CO2 27 09/16/2013 1121   BUN 13.1 09/16/2013 1121   CREATININE 0.7 09/16/2013 1121   CREATININE 0.80 08/21/2013 0800      Component Value Date/Time   CALCIUM 10.0 09/16/2013 1121       RADIOGRAPHIC STUDIES:   ASSESSMENT AND PLAN: This is a very pleasant 63 years old white female recently diagnosed with metastatic non-small cell lung cancer, adenocarcinoma presented with right lower lobe lung mass in addition to mediastinal and supraclavicular lymphadenopathy as well as diffuse metastatic liver lesions and retroperitoneal lymphadenopathy. The patient is currently undergoing systemic chemotherapy with carboplatin and Alimta status post 1 cycle and tolerating it fairly well. I recommended for her to proceed with cycle #2 today as scheduled. She would like to delay the start of cycle #3 by one week because she will be in [place] that week. She would come back for followup visit in 4 weeks with the start of cycle #3. She was advised to call immediately if she has any concerning symptoms in the  interval. The patient voices understanding of current disease status and treatment options and is in agreement with the current care plan.  All questions were answered. The patient knows to call the clinic with any problems, questions or concerns. We can certainly see the patient much sooner if necessary.  Disclaimer: This note was dictated with voice recognition software. Similar sounding words can inadvertently be transcribed and may not be corrected upon review.

## 2013-09-23 NOTE — Patient Instructions (Signed)
[facility] Discharge Instructions for Patients Receiving Chemotherapy  Today you received the following chemotherapy agents Alimta, Carbo.  To help prevent nausea and vomiting after your treatment, we encourage you to take your nausea medication as prescribed.   If you develop nausea and vomiting that is not controlled by your nausea medication, call the clinic.   BELOW ARE SYMPTOMS THAT SHOULD BE REPORTED IMMEDIATELY:  *FEVER GREATER THAN 100.5 F  *CHILLS WITH OR WITHOUT FEVER  NAUSEA AND VOMITING THAT IS NOT CONTROLLED WITH YOUR NAUSEA MEDICATION  *UNUSUAL SHORTNESS OF BREATH  *UNUSUAL BRUISING OR BLEEDING  TENDERNESS IN MOUTH AND THROAT WITH OR WITHOUT PRESENCE OF ULCERS  *URINARY PROBLEMS  *BOWEL PROBLEMS  UNUSUAL RASH Items with * indicate a potential emergency and should be followed up as soon as possible.  Feel free to call the clinic should you have any questions or concerns. The clinic phone number is [phone].  It was my pleasure to take care of you today!  Leeanne Rio, RN

## 2013-09-30 ENCOUNTER — Other Ambulatory Visit (HOSPITAL_COMMUNITY)
Admission: RE | Admit: 2013-09-30 | Discharge: 2013-09-30 | Disposition: A | Discharge status: Home / Self Care | Payer: 59 | Source: Ambulatory Visit | Attending: Obstetrics and Gynecology | Admitting: Obstetrics and Gynecology

## 2013-09-30 ENCOUNTER — Other Ambulatory Visit (HOSPITAL_BASED_OUTPATIENT_CLINIC_OR_DEPARTMENT_OTHER): Payer: 59

## 2013-09-30 ENCOUNTER — Other Ambulatory Visit: Payer: Self-pay | Admitting: Obstetrics and Gynecology

## 2013-09-30 DIAGNOSIS — C787 Secondary malignant neoplasm of liver and intrahepatic bile duct: Secondary | ICD-10-CM

## 2013-09-30 DIAGNOSIS — R87810 Cervical high risk human papillomavirus (HPV) DNA test positive: Secondary | ICD-10-CM | POA: Insufficient documentation

## 2013-09-30 DIAGNOSIS — Z1151 Encounter for screening for human papillomavirus (HPV): Secondary | ICD-10-CM | POA: Insufficient documentation

## 2013-09-30 DIAGNOSIS — Z124 Encounter for screening for malignant neoplasm of cervix: Secondary | ICD-10-CM | POA: Insufficient documentation

## 2013-09-30 DIAGNOSIS — C3430 Malignant neoplasm of lower lobe, unspecified bronchus or lung: Secondary | ICD-10-CM

## 2013-09-30 LAB — CBC WITH DIFFERENTIAL/PLATELET
BASO%: 0.3 % (ref 0.0–2.0)
Basophils Absolute: 0 10*3/uL (ref 0.0–0.1)
EOS%: 1.4 % (ref 0.0–7.0)
Eosinophils Absolute: 0.1 10*3/uL (ref 0.0–0.5)
HEMATOCRIT: 35.6 % (ref 34.8–46.6)
HGB: 11.9 g/dL (ref 11.6–15.9)
LYMPH%: 32.1 % (ref 14.0–49.7)
MCH: 30.4 pg (ref 25.1–34.0)
MCHC: 33.4 g/dL (ref 31.5–36.0)
MCV: 91.2 fL (ref 79.5–101.0)
MONO#: 0.4 10*3/uL (ref 0.1–0.9)
MONO%: 9.4 % (ref 0.0–14.0)
NEUT#: 2.7 10*3/uL (ref 1.5–6.5)
NEUT%: 56.8 % (ref 38.4–76.8)
Platelets: 349 10*3/uL (ref 145–400)
RBC: 3.91 10*6/uL (ref 3.70–5.45)
RDW: 13.3 % (ref 11.2–14.5)
WBC: 4.7 10*3/uL (ref 3.9–10.3)
lymph#: 1.5 10*3/uL (ref 0.9–3.3)

## 2013-09-30 LAB — COMPREHENSIVE METABOLIC PANEL (CC13)
ALT: 74 U/L — ABNORMAL HIGH (ref 0–55)
AST: 66 U/L — ABNORMAL HIGH (ref 5–34)
Albumin: 3.1 g/dL — ABNORMAL LOW (ref 3.5–5.0)
Alkaline Phosphatase: 167 U/L — ABNORMAL HIGH (ref 40–150)
Anion Gap: 7 mEq/L (ref 3–11)
BUN: 14.8 mg/dL (ref 7.0–26.0)
CO2: 28 mEq/L (ref 22–29)
Calcium: 9.7 mg/dL (ref 8.4–10.4)
Chloride: 102 mEq/L (ref 98–109)
Creatinine: 0.7 mg/dL (ref 0.6–1.1)
Glucose: 127 mg/dl (ref 70–140)
Potassium: 4.3 mEq/L (ref 3.5–5.1)
Sodium: 136 mEq/L (ref 136–145)
Total Bilirubin: 0.56 mg/dL (ref 0.20–1.20)
Total Protein: 6.3 g/dL — ABNORMAL LOW (ref 6.4–8.3)

## 2013-10-14 ENCOUNTER — Other Ambulatory Visit: Payer: 59

## 2013-10-21 ENCOUNTER — Encounter: Payer: Self-pay | Admitting: Internal Medicine

## 2013-10-21 ENCOUNTER — Ambulatory Visit (HOSPITAL_BASED_OUTPATIENT_CLINIC_OR_DEPARTMENT_OTHER): Payer: 59

## 2013-10-21 ENCOUNTER — Telehealth: Payer: Self-pay | Admitting: Internal Medicine

## 2013-10-21 ENCOUNTER — Telehealth: Payer: Self-pay | Admitting: *Deleted

## 2013-10-21 ENCOUNTER — Ambulatory Visit (HOSPITAL_BASED_OUTPATIENT_CLINIC_OR_DEPARTMENT_OTHER): Payer: 59 | Admitting: Internal Medicine

## 2013-10-21 ENCOUNTER — Other Ambulatory Visit (HOSPITAL_BASED_OUTPATIENT_CLINIC_OR_DEPARTMENT_OTHER): Payer: 59

## 2013-10-21 VITALS — BP 135/68 | HR 70 | Temp 97.3°F | Resp 20 | Ht 62.0 in | Wt 107.8 lb

## 2013-10-21 DIAGNOSIS — C3430 Malignant neoplasm of lower lobe, unspecified bronchus or lung: Secondary | ICD-10-CM

## 2013-10-21 DIAGNOSIS — C787 Secondary malignant neoplasm of liver and intrahepatic bile duct: Secondary | ICD-10-CM

## 2013-10-21 DIAGNOSIS — C3490 Malignant neoplasm of unspecified part of unspecified bronchus or lung: Secondary | ICD-10-CM

## 2013-10-21 DIAGNOSIS — Z5111 Encounter for antineoplastic chemotherapy: Secondary | ICD-10-CM

## 2013-10-21 LAB — COMPREHENSIVE METABOLIC PANEL (CC13)
ALBUMIN: 3.5 g/dL (ref 3.5–5.0)
ALT: 78 U/L — ABNORMAL HIGH (ref 0–55)
AST: 107 U/L — AB (ref 5–34)
Alkaline Phosphatase: 235 U/L — ABNORMAL HIGH (ref 40–150)
Anion Gap: 13 mEq/L — ABNORMAL HIGH (ref 3–11)
BILIRUBIN TOTAL: 0.36 mg/dL (ref 0.20–1.20)
BUN: 16.2 mg/dL (ref 7.0–26.0)
CO2: 22 mEq/L (ref 22–29)
Calcium: 10.4 mg/dL (ref 8.4–10.4)
Chloride: 103 mEq/L (ref 98–109)
Creatinine: 0.7 mg/dL (ref 0.6–1.1)
Glucose: 108 mg/dl (ref 70–140)
POTASSIUM: 4.5 meq/L (ref 3.5–5.1)
SODIUM: 138 meq/L (ref 136–145)
Total Protein: 7.8 g/dL (ref 6.4–8.3)

## 2013-10-21 LAB — CBC WITH DIFFERENTIAL/PLATELET
BASO%: 0.1 % (ref 0.0–2.0)
Basophils Absolute: 0 10*3/uL (ref 0.0–0.1)
EOS%: 0 % (ref 0.0–7.0)
Eosinophils Absolute: 0 10*3/uL (ref 0.0–0.5)
HCT: 32.5 % — ABNORMAL LOW (ref 34.8–46.6)
HGB: 11.3 g/dL — ABNORMAL LOW (ref 11.6–15.9)
LYMPH#: 1.7 10*3/uL (ref 0.9–3.3)
LYMPH%: 9 % — ABNORMAL LOW (ref 14.0–49.7)
MCH: 30.7 pg (ref 25.1–34.0)
MCHC: 34.8 g/dL (ref 31.5–36.0)
MCV: 88.3 fL (ref 79.5–101.0)
MONO#: 1.6 10*3/uL — ABNORMAL HIGH (ref 0.1–0.9)
MONO%: 8.2 % (ref 0.0–14.0)
NEUT#: 15.8 10*3/uL — ABNORMAL HIGH (ref 1.5–6.5)
NEUT%: 82.7 % — ABNORMAL HIGH (ref 38.4–76.8)
Platelets: 401 10*3/uL — ABNORMAL HIGH (ref 145–400)
RBC: 3.68 10*6/uL — ABNORMAL LOW (ref 3.70–5.45)
RDW: 14.6 % — AB (ref 11.2–14.5)
WBC: 19.1 10*3/uL — ABNORMAL HIGH (ref 3.9–10.3)
nRBC: 0 % (ref 0–0)

## 2013-10-21 MED ORDER — CYANOCOBALAMIN 1000 MCG/ML IJ SOLN
1000.0000 ug | Freq: Once | INTRAMUSCULAR | Status: AC
  Administered 2013-10-21: 1000 ug via INTRAMUSCULAR

## 2013-10-21 MED ORDER — PEMETREXED DISODIUM CHEMO INJECTION 500 MG
500.0000 mg/m2 | Freq: Once | INTRAVENOUS | Status: AC
  Administered 2013-10-21: 725 mg via INTRAVENOUS
  Filled 2013-10-21: qty 29

## 2013-10-21 MED ORDER — ONDANSETRON 16 MG/50ML IVPB (CHCC)
16.0000 mg | Freq: Once | INTRAVENOUS | Status: AC
  Administered 2013-10-21: 16 mg via INTRAVENOUS

## 2013-10-21 MED ORDER — DEXAMETHASONE SODIUM PHOSPHATE 20 MG/5ML IJ SOLN
INTRAMUSCULAR | Status: AC
  Filled 2013-10-21: qty 5

## 2013-10-21 MED ORDER — CYANOCOBALAMIN 1000 MCG/ML IJ SOLN
INTRAMUSCULAR | Status: AC
  Filled 2013-10-21: qty 1

## 2013-10-21 MED ORDER — SODIUM CHLORIDE 0.9 % IV SOLN
Freq: Once | INTRAVENOUS | Status: AC
  Administered 2013-10-21: 12:00:00 via INTRAVENOUS

## 2013-10-21 MED ORDER — DEXAMETHASONE SODIUM PHOSPHATE 20 MG/5ML IJ SOLN
20.0000 mg | Freq: Once | INTRAMUSCULAR | Status: AC
  Administered 2013-10-21: 20 mg via INTRAVENOUS

## 2013-10-21 MED ORDER — ONDANSETRON 16 MG/50ML IVPB (CHCC)
INTRAVENOUS | Status: AC
  Filled 2013-10-21: qty 16

## 2013-10-21 MED ORDER — SODIUM CHLORIDE 0.9 % IV SOLN
446.0000 mg | Freq: Once | INTRAVENOUS | Status: AC
  Administered 2013-10-21: 450 mg via INTRAVENOUS
  Filled 2013-10-21: qty 45

## 2013-10-21 NOTE — Telephone Encounter (Signed)
Per staff message and POF I have scheduled appts.  JMW  

## 2013-10-21 NOTE — Progress Notes (Signed)
[place] [phone]   Fax:(336) [phone]  OFFICE PROGRESS NOTE  Mathews Argyle, MD [address] [place] [address]  DIAGNOSIS: Stage IV (T1b, N2, M1b) non-small cell lung cancer, adenocarcinoma, presented with a right lower lobe lung mass as well as mediastinal, hilar, bilateral supraclavicular lymphadenopathy in addition to extensive liver metastases and retroperitoneal lymphadenopathy diagnosed in December of 2014.  MOLECULAR BIOMARKERS: Foundation ONE biomarker testing revealed TP53 E204*, KEAP1 R639f*25+, NFKBIA amplification, NKX2-1 amplification, SMARCA4 E463*. The patient was negative for EGFR and ALK gene translocation   PRIOR THERAPY: None   CURRENT THERAPY: Systemic chemotherapy with carboplatin for AUC of 5 and Alimta 500 mg/M2 every 3 weeks, status post 2 cycles. First dose 09/02/2013.   CHEMOTHERAPY INTENT: palliative  CURRENT # OF CHEMOTHERAPY CYCLES: 3  CURRENT ANTIEMETICS: Zofran, dexamethasone and Compazine.  CURRENT SMOKING STATUS: former smoker  ORAL CHEMOTHERAPY AND CONSENT: None  CURRENT BISPHOSPHONATES USE: None  PAIN MANAGEMENT: 0/10  NARCOTICS INDUCED CONSTIPATION: None  LIVING WILL AND CODE STATUS: Full Code  INTERVAL HISTORY: Amber SLAYDON89y.o. female returns to the clinic today for follow up visit accompanied by her husband. The patient is feeling fine today with no specific complaints. She tolerated the second cycle of her treatment fairly well with no significant adverse effects. [place] The patient denied having any significant nausea or vomiting. She denied having any fever or chills. She has no significant weight loss or night sweats. She denied having any significant chest pain, shortness of breath, cough or hemoptysis. She is here today to start cycle #3 of her chemotherapy.  MEDICAL HISTORY: Past Medical History  Diagnosis Date  .  HTN (hypertension)     ALLERGIES:  is allergic to hctz and plendil.  MEDICATIONS:  Current Outpatient Prescriptions  Medication Sig Dispense Refill  . amLODipine (NORVASC) 10 MG tablet Take 10 mg by mouth daily.      .Marland Kitchenaspirin EC 81 MG tablet Take 81 mg by mouth daily.      . calcium carbonate (TUMS - DOSED IN MG ELEMENTAL CALCIUM) 500 MG chewable tablet Chew 1 tablet by mouth daily as needed for indigestion or heartburn.      . dexamethasone (DECADRON) 4 MG tablet 4 milligram by mouth twice a day the day before, day of and day after the chemotherapy every 3 weeks.  40 tablet  1  . folic acid (FOLVITE) 1 MG tablet Take 1 tablet (1 mg total) by mouth daily.  30 tablet  4  . lisinopril (PRINIVIL,ZESTRIL) 20 MG tablet Take 20 mg by mouth daily.      . nebivolol (BYSTOLIC) 10 MG tablet Take 10 mg by mouth daily.      .Marland KitchenoxyCODONE (ROXICODONE) 5 MG immediate release tablet Take 1 tablet (5 mg total) by mouth every 6 (six) hours as needed for severe pain.  60 tablet  0  . prochlorperazine (COMPAZINE) 10 MG tablet Take 1 tablet (10 mg total) by mouth every 6 (six) hours as needed for nausea or vomiting.  60 tablet  0   No current facility-administered medications for this visit.    REVIEW OF SYSTEMS:  Constitutional: negative Eyes: negative Ears, nose, mouth, throat, and face: negative Respiratory: negative Cardiovascular: negative Gastrointestinal: positive for abdominal pain Genitourinary:negative Integument/breast: negative Hematologic/lymphatic: negative Musculoskeletal:negative Neurological: negative Behavioral/Psych: negative Endocrine: negative Allergic/Immunologic: negative   PHYSICAL EXAMINATION: General appearance: alert, cooperative and  no distress Head: Normocephalic, without obvious abnormality, atraumatic Neck: no adenopathy, no JVD, supple, symmetrical, trachea midline and thyroid not enlarged, symmetric, no tenderness/mass/nodules Lymph nodes: Cervical, supraclavicular,  and axillary nodes normal. Resp: clear to auscultation bilaterally Back: symmetric, no curvature. ROM normal. No CVA tenderness. Cardio: regular rate and rhythm, S1, S2 normal, no murmur, click, rub or gallop GI: soft, non-tender; bowel sounds normal; no masses,  no organomegaly Extremities: extremities normal, atraumatic, no cyanosis or edema Neurologic: Alert and oriented X 3, normal strength and tone. Normal symmetric reflexes. Normal coordination and gait  ECOG PERFORMANCE STATUS: 1 - Symptomatic but completely ambulatory  Blood pressure 135/68, pulse 70, temperature 97.3 F (36.3 C), temperature source Oral, resp. rate 20, height 5' 2"  (1.575 m), weight 107 lb 12.8 oz (48.898 kg).  LABORATORY DATA: Lab Results  Component Value Date   WBC 19.1* 10/21/2013   HGB 11.3* 10/21/2013   HCT 32.5* 10/21/2013   MCV 88.3 10/21/2013   PLT 401* 10/21/2013      Chemistry      Component Value Date/Time   NA 136 09/30/2013 1103   K 4.3 09/30/2013 1103   CO2 28 09/30/2013 1103   BUN 14.8 09/30/2013 1103   CREATININE 0.7 09/30/2013 1103   CREATININE 0.80 08/21/2013 0800      Component Value Date/Time   CALCIUM 9.7 09/30/2013 1103       RADIOGRAPHIC STUDIES:   ASSESSMENT AND PLAN: This is a very pleasant 63 years old white female recently diagnosed with metastatic non-small cell lung cancer, adenocarcinoma presented with right lower lobe lung mass in addition to mediastinal and supraclavicular lymphadenopathy as well as diffuse metastatic liver lesions and retroperitoneal lymphadenopathy. The patient is currently undergoing systemic chemotherapy with carboplatin and Alimta status post 2 cycles and tolerating it fairly well. I recommended for her to proceed with cycle #3 today as scheduled.  She would come back for followup visit in 3 weeks with the start of cycle #4 after repeating CT scan of the chest, abdomen and pelvis for restaging of her disease. She was advised to call immediately if she has any  concerning symptoms in the interval. The patient voices understanding of current disease status and treatment options and is in agreement with the current care plan.  All questions were answered. The patient knows to call the clinic with any problems, questions or concerns. We can certainly see the patient much sooner if necessary.  Disclaimer: This note was dictated with voice recognition software. Similar sounding words can inadvertently be transcribed and may not be corrected upon review.

## 2013-10-21 NOTE — Patient Instructions (Signed)
Smoking Cessation, Tips for Success If you are ready to quit smoking, congratulations! You have chosen to help yourself be healthier. Cigarettes bring nicotine, tar, carbon monoxide, and other irritants into your body. Your lungs, heart, and blood vessels will be able to work better without these poisons. There are many different ways to quit smoking. Nicotine gum, nicotine patches, a nicotine inhaler, or nicotine nasal spray can help with physical craving. Hypnosis, support groups, and medicines help break the habit of smoking. WHAT THINGS CAN I DO TO MAKE QUITTING EASIER?  Here are some tips to help you quit for good:  Pick a date when you will quit smoking completely. Tell all of your friends and family about your plan to quit on that date.  Do not try to slowly cut down on the number of cigarettes you are smoking. Pick a quit date and quit smoking completely starting on that day.  Throw away all cigarettes.   Clean and remove all ashtrays from your home, work, and car.   On a card, write down your reasons for quitting. Carry the card with you and read it when you get the urge to smoke.   Cleanse your body of nicotine. Drink enough water and fluids to keep your urine clear or pale yellow. Do this after quitting to flush the nicotine from your body.   Learn to predict your moods. Do not let a bad situation be your excuse to have a cigarette. Some situations in your life might tempt you into wanting a cigarette.   Never have "just one" cigarette. It leads to wanting another and another. Remind yourself of your decision to quit.   Change habits associated with smoking. If you smoked while driving or when feeling stressed, try other activities to replace smoking. Stand up when drinking your coffee. Brush your teeth after eating. Sit in a different chair when you read the paper. Avoid alcohol while trying to quit, and try to drink fewer caffeinated beverages. Alcohol and caffeine may urge  you to smoke.   Avoid foods and drinks that can trigger a desire to smoke, such as sugary or spicy foods and alcohol.   Ask people who smoke not to smoke around you.   Have something planned to do right after eating or having a cup of coffee. For example, plan to take a walk or exercise.   Try a relaxation exercise to calm you down and decrease your stress. Remember, you may be tense and nervous for the first 2 weeks after you quit, but this will pass.   Find new activities to keep your hands busy. Play with a pen, coin, or rubber band. Doodle or draw things on paper.   Brush your teeth right after eating. This will help cut down on the craving for the taste of tobacco after meals. You can also try mouthwash.   Use oral substitutes in place of cigarettes. Try using lemon drops, carrots, cinnamon sticks, or chewing gum. Keep them handy so they are available when you have the urge to smoke.   When you have the urge to smoke, try deep breathing.   Designate your home as a nonsmoking area.   If you are a heavy smoker, ask your health care provider about a prescription for nicotine chewing gum. It can ease your withdrawal from nicotine.   Reward yourself. Set aside the cigarette money you save and buy yourself something nice.   Look for support from others. Join a support group or   smoking cessation program. Ask someone at home or at work to help you with your plan to quit smoking.   Always ask yourself, "Do I need this cigarette or is this just a reflex?" Tell yourself, "Today, I choose not to smoke," or "I do not want to smoke." You are reminding yourself of your decision to quit.  Do not replace cigarette smoking with electronic cigarettes (commonly called e-cigarettes). The safety of e-cigarettes is unknown, and some may contain harmful chemicals.  If you relapse, do not give up! Plan ahead and think about what you will do the next time you get the urge to smoke.  HOW WILL  I FEEL WHEN I QUIT SMOKING? You may have symptoms of withdrawal because your body is used to nicotine (the addictive substance in cigarettes). You may crave cigarettes, be irritable, feel very hungry, cough often, get headaches, or have difficulty concentrating. The withdrawal symptoms are only temporary. They are strongest when you first quit but will go away within 10 14 days. When withdrawal symptoms occur, stay in control. Think about your reasons for quitting. Remind yourself that these are signs that your body is healing and getting used to being without cigarettes. Remember that withdrawal symptoms are easier to treat than the major diseases that smoking can cause.  Even after the withdrawal is over, expect periodic urges to smoke. However, these cravings are generally short lived and will go away whether you smoke or not. Do not smoke!  WHAT RESOURCES ARE AVAILABLE TO HELP ME QUIT SMOKING? Your health care provider can direct you to community resources or hospitals for support, which may include:  Group support.  Education.  Hypnosis.  Therapy. Document Released: 05/11/2004 Document Revised: 06/03/2013 Document Reviewed: 01/29/2013 [employer] Patient Information 2014 [employer].  

## 2013-10-21 NOTE — Patient Instructions (Signed)
[facility] Discharge Instructions for Patients Receiving Chemotherapy  Today you received the following chemotherapy agents: Alimta, Carboplatin  To help prevent nausea and vomiting after your treatment, we encourage you to take your nausea medication as prescribed.   If you develop nausea and vomiting that is not controlled by your nausea medication, call the clinic.   BELOW ARE SYMPTOMS THAT SHOULD BE REPORTED IMMEDIATELY:  *FEVER GREATER THAN 100.5 F  *CHILLS WITH OR WITHOUT FEVER  NAUSEA AND VOMITING THAT IS NOT CONTROLLED WITH YOUR NAUSEA MEDICATION  *UNUSUAL SHORTNESS OF BREATH  *UNUSUAL BRUISING OR BLEEDING  TENDERNESS IN MOUTH AND THROAT WITH OR WITHOUT PRESENCE OF ULCERS  *URINARY PROBLEMS  *BOWEL PROBLEMS  UNUSUAL RASH Items with * indicate a potential emergency and should be followed up as soon as possible.  Feel free to call the clinic you have any questions or concerns. The clinic phone number is [phone].

## 2013-10-21 NOTE — Telephone Encounter (Signed)
Gave pt appt for lab,md and chemo for march and April  2015, emailed Sharyn Lull regarding chemo

## 2013-10-23 ENCOUNTER — Telehealth: Payer: Self-pay | Admitting: Internal Medicine

## 2013-10-23 NOTE — Telephone Encounter (Signed)
Talked to pt and gave him appt for march ansd APril 2015, he willl come by and get appt calendar on Wednesday

## 2013-10-28 ENCOUNTER — Other Ambulatory Visit (HOSPITAL_BASED_OUTPATIENT_CLINIC_OR_DEPARTMENT_OTHER): Payer: 59

## 2013-10-28 DIAGNOSIS — C3490 Malignant neoplasm of unspecified part of unspecified bronchus or lung: Secondary | ICD-10-CM

## 2013-10-28 LAB — CBC WITH DIFFERENTIAL/PLATELET
BASO%: 0.2 % (ref 0.0–2.0)
Basophils Absolute: 0 10*3/uL (ref 0.0–0.1)
EOS%: 0.6 % (ref 0.0–7.0)
Eosinophils Absolute: 0 10*3/uL (ref 0.0–0.5)
HEMATOCRIT: 34.2 % — AB (ref 34.8–46.6)
HEMOGLOBIN: 11.5 g/dL — AB (ref 11.6–15.9)
LYMPH#: 2 10*3/uL (ref 0.9–3.3)
LYMPH%: 30.6 % (ref 14.0–49.7)
MCH: 30.9 pg (ref 25.1–34.0)
MCHC: 33.7 g/dL (ref 31.5–36.0)
MCV: 91.7 fL (ref 79.5–101.0)
MONO#: 0.7 10*3/uL (ref 0.1–0.9)
MONO%: 10.5 % (ref 0.0–14.0)
NEUT#: 3.7 10*3/uL (ref 1.5–6.5)
NEUT%: 58.1 % (ref 38.4–76.8)
Platelets: 345 10*3/uL (ref 145–400)
RBC: 3.73 10*6/uL (ref 3.70–5.45)
RDW: 14.7 % — ABNORMAL HIGH (ref 11.2–14.5)
WBC: 6.4 10*3/uL (ref 3.9–10.3)

## 2013-10-28 LAB — COMPREHENSIVE METABOLIC PANEL (CC13)
ALT: 86 U/L — ABNORMAL HIGH (ref 0–55)
AST: 89 U/L — AB (ref 5–34)
Albumin: 3.1 g/dL — ABNORMAL LOW (ref 3.5–5.0)
Alkaline Phosphatase: 226 U/L — ABNORMAL HIGH (ref 40–150)
Anion Gap: 9 mEq/L (ref 3–11)
BUN: 12.7 mg/dL (ref 7.0–26.0)
CHLORIDE: 100 meq/L (ref 98–109)
CO2: 26 mEq/L (ref 22–29)
Calcium: 10 mg/dL (ref 8.4–10.4)
Creatinine: 0.7 mg/dL (ref 0.6–1.1)
Glucose: 89 mg/dl (ref 70–140)
Potassium: 4.4 mEq/L (ref 3.5–5.1)
Sodium: 135 mEq/L — ABNORMAL LOW (ref 136–145)
Total Bilirubin: 0.84 mg/dL (ref 0.20–1.20)
Total Protein: 7 g/dL (ref 6.4–8.3)

## 2013-11-04 ENCOUNTER — Telehealth: Payer: Self-pay | Admitting: Internal Medicine

## 2013-11-04 ENCOUNTER — Other Ambulatory Visit (HOSPITAL_BASED_OUTPATIENT_CLINIC_OR_DEPARTMENT_OTHER): Payer: 59

## 2013-11-04 DIAGNOSIS — C3490 Malignant neoplasm of unspecified part of unspecified bronchus or lung: Secondary | ICD-10-CM

## 2013-11-04 DIAGNOSIS — C787 Secondary malignant neoplasm of liver and intrahepatic bile duct: Secondary | ICD-10-CM

## 2013-11-04 DIAGNOSIS — C3430 Malignant neoplasm of lower lobe, unspecified bronchus or lung: Secondary | ICD-10-CM

## 2013-11-04 DIAGNOSIS — I10 Essential (primary) hypertension: Secondary | ICD-10-CM

## 2013-11-04 LAB — CBC WITH DIFFERENTIAL/PLATELET
BASO%: 0.2 % (ref 0.0–2.0)
BASOS ABS: 0 10*3/uL (ref 0.0–0.1)
EOS%: 0.3 % (ref 0.0–7.0)
Eosinophils Absolute: 0 10*3/uL (ref 0.0–0.5)
HCT: 30.6 % — ABNORMAL LOW (ref 34.8–46.6)
HGB: 10.4 g/dL — ABNORMAL LOW (ref 11.6–15.9)
LYMPH#: 1.4 10*3/uL (ref 0.9–3.3)
LYMPH%: 23.1 % (ref 14.0–49.7)
MCH: 31.2 pg (ref 25.1–34.0)
MCHC: 33.9 g/dL (ref 31.5–36.0)
MCV: 92.1 fL (ref 79.5–101.0)
MONO#: 0.6 10*3/uL (ref 0.1–0.9)
MONO%: 9.4 % (ref 0.0–14.0)
NEUT%: 67 % (ref 38.4–76.8)
NEUTROS ABS: 4 10*3/uL (ref 1.5–6.5)
Platelets: 241 10*3/uL (ref 145–400)
RBC: 3.32 10*6/uL — ABNORMAL LOW (ref 3.70–5.45)
RDW: 15.3 % — ABNORMAL HIGH (ref 11.2–14.5)
WBC: 5.9 10*3/uL (ref 3.9–10.3)

## 2013-11-04 LAB — COMPREHENSIVE METABOLIC PANEL (CC13)
ALBUMIN: 2.8 g/dL — AB (ref 3.5–5.0)
ALT: 69 U/L — ABNORMAL HIGH (ref 0–55)
AST: 88 U/L — ABNORMAL HIGH (ref 5–34)
Alkaline Phosphatase: 238 U/L — ABNORMAL HIGH (ref 40–150)
Anion Gap: 12 mEq/L — ABNORMAL HIGH (ref 3–11)
BUN: 12 mg/dL (ref 7.0–26.0)
CALCIUM: 9.7 mg/dL (ref 8.4–10.4)
CHLORIDE: 99 meq/L (ref 98–109)
CO2: 25 meq/L (ref 22–29)
Creatinine: 0.7 mg/dL (ref 0.6–1.1)
GLUCOSE: 131 mg/dL (ref 70–140)
POTASSIUM: 4.3 meq/L (ref 3.5–5.1)
Sodium: 136 mEq/L (ref 136–145)
Total Bilirubin: 0.53 mg/dL (ref 0.20–1.20)
Total Protein: 6.5 g/dL (ref 6.4–8.3)

## 2013-11-04 NOTE — Telephone Encounter (Signed)
gv and printed appt sched with weekly labs added

## 2013-11-09 ENCOUNTER — Ambulatory Visit (HOSPITAL_COMMUNITY)
Admission: RE | Admit: 2013-11-09 | Discharge: 2013-11-09 | Disposition: A | Discharge status: Home / Self Care | Payer: 59 | Source: Ambulatory Visit | Attending: Internal Medicine | Admitting: Internal Medicine

## 2013-11-09 ENCOUNTER — Encounter (HOSPITAL_COMMUNITY): Payer: Self-pay

## 2013-11-09 DIAGNOSIS — J90 Pleural effusion, not elsewhere classified: Secondary | ICD-10-CM | POA: Insufficient documentation

## 2013-11-09 DIAGNOSIS — R599 Enlarged lymph nodes, unspecified: Secondary | ICD-10-CM | POA: Insufficient documentation

## 2013-11-09 DIAGNOSIS — C3490 Malignant neoplasm of unspecified part of unspecified bronchus or lung: Secondary | ICD-10-CM | POA: Insufficient documentation

## 2013-11-09 DIAGNOSIS — C787 Secondary malignant neoplasm of liver and intrahepatic bile duct: Secondary | ICD-10-CM | POA: Insufficient documentation

## 2013-11-09 DIAGNOSIS — Z9221 Personal history of antineoplastic chemotherapy: Secondary | ICD-10-CM | POA: Insufficient documentation

## 2013-11-09 MED ORDER — IOHEXOL 300 MG/ML  SOLN
100.0000 mL | Freq: Once | INTRAMUSCULAR | Status: AC | PRN
  Administered 2013-11-09: 100 mL via INTRAVENOUS

## 2013-11-10 ENCOUNTER — Other Ambulatory Visit: Payer: Self-pay | Admitting: Obstetrics and Gynecology

## 2013-11-11 ENCOUNTER — Telehealth: Payer: Self-pay | Admitting: Internal Medicine

## 2013-11-11 ENCOUNTER — Other Ambulatory Visit (HOSPITAL_BASED_OUTPATIENT_CLINIC_OR_DEPARTMENT_OTHER): Payer: 59

## 2013-11-11 ENCOUNTER — Ambulatory Visit: Payer: 59

## 2013-11-11 ENCOUNTER — Encounter: Payer: Self-pay | Admitting: Internal Medicine

## 2013-11-11 ENCOUNTER — Other Ambulatory Visit: Payer: Self-pay | Admitting: *Deleted

## 2013-11-11 ENCOUNTER — Other Ambulatory Visit: Payer: Self-pay

## 2013-11-11 ENCOUNTER — Ambulatory Visit (HOSPITAL_BASED_OUTPATIENT_CLINIC_OR_DEPARTMENT_OTHER): Payer: 59 | Admitting: Internal Medicine

## 2013-11-11 VITALS — BP 125/69 | HR 79 | Temp 97.7°F | Resp 18 | Ht 62.0 in | Wt 106.6 lb

## 2013-11-11 DIAGNOSIS — C787 Secondary malignant neoplasm of liver and intrahepatic bile duct: Secondary | ICD-10-CM

## 2013-11-11 DIAGNOSIS — C3430 Malignant neoplasm of lower lobe, unspecified bronchus or lung: Secondary | ICD-10-CM

## 2013-11-11 DIAGNOSIS — R599 Enlarged lymph nodes, unspecified: Secondary | ICD-10-CM

## 2013-11-11 DIAGNOSIS — C3490 Malignant neoplasm of unspecified part of unspecified bronchus or lung: Secondary | ICD-10-CM

## 2013-11-11 DIAGNOSIS — C7970 Secondary malignant neoplasm of unspecified adrenal gland: Secondary | ICD-10-CM

## 2013-11-11 LAB — CBC WITH DIFFERENTIAL/PLATELET
BASO%: 0 % (ref 0.0–2.0)
Basophils Absolute: 0 10*3/uL (ref 0.0–0.1)
EOS ABS: 0 10*3/uL (ref 0.0–0.5)
EOS%: 0 % (ref 0.0–7.0)
HCT: 32 % — ABNORMAL LOW (ref 34.8–46.6)
HGB: 10.9 g/dL — ABNORMAL LOW (ref 11.6–15.9)
LYMPH%: 12.2 % — ABNORMAL LOW (ref 14.0–49.7)
MCH: 30.4 pg (ref 25.1–34.0)
MCHC: 34.1 g/dL (ref 31.5–36.0)
MCV: 89.4 fL (ref 79.5–101.0)
MONO#: 1.6 10*3/uL — AB (ref 0.1–0.9)
MONO%: 13.7 % (ref 0.0–14.0)
NEUT%: 74.1 % (ref 38.4–76.8)
NEUTROS ABS: 8.4 10*3/uL — AB (ref 1.5–6.5)
NRBC: 0 % (ref 0–0)
Platelets: 468 10*3/uL — ABNORMAL HIGH (ref 145–400)
RBC: 3.58 10*6/uL — AB (ref 3.70–5.45)
RDW: 16.6 % — AB (ref 11.2–14.5)
WBC: 11.4 10*3/uL — AB (ref 3.9–10.3)
lymph#: 1.4 10*3/uL (ref 0.9–3.3)

## 2013-11-11 NOTE — Patient Instructions (Signed)
Smoking Cessation, Tips for Success If you are ready to quit smoking, congratulations! You have chosen to help yourself be healthier. Cigarettes bring nicotine, tar, carbon monoxide, and other irritants into your body. Your lungs, heart, and blood vessels will be able to work better without these poisons. There are many different ways to quit smoking. Nicotine gum, nicotine patches, a nicotine inhaler, or nicotine nasal spray can help with physical craving. Hypnosis, support groups, and medicines help break the habit of smoking. WHAT THINGS CAN I DO TO MAKE QUITTING EASIER?  Here are some tips to help you quit for good:  Pick a date when you will quit smoking completely. Tell all of your friends and family about your plan to quit on that date.  Do not try to slowly cut down on the number of cigarettes you are smoking. Pick a quit date and quit smoking completely starting on that day.  Throw away all cigarettes.   Clean and remove all ashtrays from your home, work, and car.   On a card, write down your reasons for quitting. Carry the card with you and read it when you get the urge to smoke.   Cleanse your body of nicotine. Drink enough water and fluids to keep your urine clear or pale yellow. Do this after quitting to flush the nicotine from your body.   Learn to predict your moods. Do not let a bad situation be your excuse to have a cigarette. Some situations in your life might tempt you into wanting a cigarette.   Never have "just one" cigarette. It leads to wanting another and another. Remind yourself of your decision to quit.   Change habits associated with smoking. If you smoked while driving or when feeling stressed, try other activities to replace smoking. Stand up when drinking your coffee. Brush your teeth after eating. Sit in a different chair when you read the paper. Avoid alcohol while trying to quit, and try to drink fewer caffeinated beverages. Alcohol and caffeine may urge  you to smoke.   Avoid foods and drinks that can trigger a desire to smoke, such as sugary or spicy foods and alcohol.   Ask people who smoke not to smoke around you.   Have something planned to do right after eating or having a cup of coffee. For example, plan to take a walk or exercise.   Try a relaxation exercise to calm you down and decrease your stress. Remember, you may be tense and nervous for the first 2 weeks after you quit, but this will pass.   Find new activities to keep your hands busy. Play with a pen, coin, or rubber band. Doodle or draw things on paper.   Brush your teeth right after eating. This will help cut down on the craving for the taste of tobacco after meals. You can also try mouthwash.   Use oral substitutes in place of cigarettes. Try using lemon drops, carrots, cinnamon sticks, or chewing gum. Keep them handy so they are available when you have the urge to smoke.   When you have the urge to smoke, try deep breathing.   Designate your home as a nonsmoking area.   If you are a heavy smoker, ask your health care provider about a prescription for nicotine chewing gum. It can ease your withdrawal from nicotine.   Reward yourself. Set aside the cigarette money you save and buy yourself something nice.   Look for support from others. Join a support group or   smoking cessation program. Ask someone at home or at work to help you with your plan to quit smoking.   Always ask yourself, "Do I need this cigarette or is this just a reflex?" Tell yourself, "Today, I choose not to smoke," or "I do not want to smoke." You are reminding yourself of your decision to quit.  Do not replace cigarette smoking with electronic cigarettes (commonly called e-cigarettes). The safety of e-cigarettes is unknown, and some may contain harmful chemicals.  If you relapse, do not give up! Plan ahead and think about what you will do the next time you get the urge to smoke.  HOW WILL  I FEEL WHEN I QUIT SMOKING? You may have symptoms of withdrawal because your body is used to nicotine (the addictive substance in cigarettes). You may crave cigarettes, be irritable, feel very hungry, cough often, get headaches, or have difficulty concentrating. The withdrawal symptoms are only temporary. They are strongest when you first quit but will go away within 10 14 days. When withdrawal symptoms occur, stay in control. Think about your reasons for quitting. Remind yourself that these are signs that your body is healing and getting used to being without cigarettes. Remember that withdrawal symptoms are easier to treat than the major diseases that smoking can cause.  Even after the withdrawal is over, expect periodic urges to smoke. However, these cravings are generally short lived and will go away whether you smoke or not. Do not smoke!  WHAT RESOURCES ARE AVAILABLE TO HELP ME QUIT SMOKING? Your health care provider can direct you to community resources or hospitals for support, which may include:  Group support.  Education.  Hypnosis.  Therapy. Document Released: 05/11/2004 Document Revised: 06/03/2013 Document Reviewed: 01/29/2013 [employer] Patient Information 2014 [employer].  

## 2013-11-11 NOTE — Telephone Encounter (Signed)
gv pt appt schedule for march thru may. message to MM re 4/8 appt - no availability - pt aware.

## 2013-11-11 NOTE — Progress Notes (Addendum)
[facility] [phone]   [phone]  OFFICE PROGRESS NOTE  Mathews Argyle, MD [address] [place] [address]  DIAGNOSIS: Stage IV (T1b, N2, M1b) non-small cell lung cancer, adenocarcinoma, presented with a right lower lobe lung mass as well as mediastinal, hilar, bilateral supraclavicular lymphadenopathy in addition to extensive liver metastases and retroperitoneal lymphadenopathy diagnosed in December of 2014.  MOLECULAR BIOMARKERS: [facility] ONE biomarker testing revealed TP53 E204*, KEAP1 R662f*25+, NFKBIA amplification, NKX2-1 amplification, SMARCA4 E463*. The patient was negative for EGFR and ALK gene translocation   PRIOR THERAPY: Systemic chemotherapy with carboplatin for AUC of 5 and Alimta 500 mg/M2 every 3 weeks, status post 3 cycles. First dose 09/02/2013.   CURRENT THERAPY: Systemic chemotherapy with cisplatin 100 mg/M2 on days 1 and gemcitabine 800 mg/M2 on days 1 and 8 every 3 weeks, first cycle on 11/12/2013   CHEMOTHERAPY INTENT: palliative  CURRENT # OF CHEMOTHERAPY CYCLES: 1  CURRENT ANTIEMETICS: Zofran, dexamethasone and Compazine.  CURRENT SMOKING STATUS: former smoker  ORAL CHEMOTHERAPY AND CONSENT: None  CURRENT BISPHOSPHONATES USE: None  PAIN MANAGEMENT: 0/10  NARCOTICS INDUCED CONSTIPATION: None  LIVING WILL AND CODE STATUS: Full Code  INTERVAL HISTORY: Amber SHARMA63y.o. female returns to the clinic today for follow up visit accompanied by her husband. The patient is feeling fine today with no specific complaints except for fatigue and occasional right upper quadrant abdominal pain. She tolerated the third cycle of her treatment fairly well with no significant adverse effects. The patient denied having any significant nausea or vomiting. She denied having any fever or chills. She has no significant weight loss or night sweats. She denied having any significant chest pain, shortness of  breath, cough or hemoptysis. She had repeat CT scan of the chest, abdomen and pelvis performed recently and she is here for evaluation and discussion of her scan results.  MEDICAL HISTORY: Past Medical History  Diagnosis Date  . HTN (hypertension)     ALLERGIES:  is allergic to hctz and plendil.  MEDICATIONS:  Current Outpatient Prescriptions  Medication Sig Dispense Refill  . amLODipine (NORVASC) 10 MG tablet Take 10 mg by mouth daily.      .Marland Kitchenaspirin EC 81 MG tablet Take 81 mg by mouth daily.      . calcium carbonate (TUMS - DOSED IN MG ELEMENTAL CALCIUM) 500 MG chewable tablet Chew 1 tablet by mouth daily as needed for indigestion or heartburn.      . dexamethasone (DECADRON) 4 MG tablet 4 milligram by mouth twice a day the day before, day of and day after the chemotherapy every 3 weeks.  40 tablet  1  . folic acid (FOLVITE) 1 MG tablet Take 1 tablet (1 mg total) by mouth daily.  30 tablet  4  . lisinopril (PRINIVIL,ZESTRIL) 20 MG tablet Take 20 mg by mouth daily.      . nebivolol (BYSTOLIC) 10 MG tablet Take 10 mg by mouth daily.      .Marland KitchenoxyCODONE (ROXICODONE) 5 MG immediate release tablet Take 1 tablet (5 mg total) by mouth every 6 (six) hours as needed for severe pain.  60 tablet  0  . prochlorperazine (COMPAZINE) 10 MG tablet Take 1 tablet (10 mg total) by mouth every 6 (six) hours as needed for nausea or vomiting.  60 tablet  0   No current facility-administered medications for this visit.    REVIEW OF SYSTEMS:  Constitutional: negative Eyes: negative  Ears, nose, mouth, throat, and face: negative Respiratory: negative Cardiovascular: negative Gastrointestinal: positive for abdominal pain Genitourinary:negative Integument/breast: negative Hematologic/lymphatic: negative Musculoskeletal:negative Neurological: negative Behavioral/Psych: negative Endocrine: negative Allergic/Immunologic: negative   PHYSICAL EXAMINATION: General appearance: alert, cooperative and no  distress Head: Normocephalic, without obvious abnormality, atraumatic Neck: no adenopathy, no JVD, supple, symmetrical, trachea midline and thyroid not enlarged, symmetric, no tenderness/mass/nodules Lymph nodes: Cervical, supraclavicular, and axillary nodes normal. Resp: clear to auscultation bilaterally Back: symmetric, no curvature. ROM normal. No CVA tenderness. Cardio: regular rate and rhythm, S1, S2 normal, no murmur, click, rub or gallop GI: soft, non-tender; bowel sounds normal; no masses,  no organomegaly Extremities: extremities normal, atraumatic, no cyanosis or edema Neurologic: Alert and oriented X 3, normal strength and tone. Normal symmetric reflexes. Normal coordination and gait  ECOG PERFORMANCE STATUS: 1 - Symptomatic but completely ambulatory  Blood pressure 125/69, pulse 79, temperature 97.7 F (36.5 C), temperature source Oral, resp. rate 18, height 5' 2"  (1.575 m), weight 106 lb 9.6 oz (48.353 kg), SpO2 98.00%.  LABORATORY DATA: Lab Results  Component Value Date   WBC 11.4* 11/11/2013   HGB 10.9* 11/11/2013   HCT 32.0* 11/11/2013   MCV 89.4 11/11/2013   PLT 468* 11/11/2013      Chemistry      Component Value Date/Time   NA 136 11/04/2013 1107   K 4.3 11/04/2013 1107   CO2 25 11/04/2013 1107   BUN 12.0 11/04/2013 1107   CREATININE 0.7 11/04/2013 1107   CREATININE 0.80 08/21/2013 0800      Component Value Date/Time   CALCIUM 9.7 11/04/2013 1107       RADIOGRAPHIC STUDIES:  Ct Chest W Contrast  11/09/2013   CLINICAL DATA:  Metastatic lung cancer. Restaging. Chemotherapy ongoing.  EXAM: CT CHEST, ABDOMEN, AND PELVIS WITH CONTRAST  TECHNIQUE: Multidetector CT imaging of the chest, abdomen and pelvis was performed following the standard protocol during bolus administration of intravenous contrast.  CONTRAST:  162m OMNIPAQUE IOHEXOL 300 MG/ML  SOLN  COMPARISON:  NM PET IMAGE INITIAL (PI) SKULL BASE TO THIGH dated 08/26/2013; [place] dated 08/12/2013; CT ABD/PELVIS  W CM dated 08/07/2013; CT CHEST W/O CM SCREENING dated 08/05/2013  FINDINGS:   CT CHEST FINDINGS  Left supraclavicular adenopathy is similar to prior PET-CT with nodes measuring 8 and 9 mm short axis on image 5. There are several residual enlarged mediastinal lymph nodes, including a 10 mm AP window node on image 19, a 12 mm subcarinal node on image 23 and an 11 mm left hilar node on image 24.  There are small bilateral pleural effusions. There is no significant pericardial effusion. Atherosclerosis of the aorta, great vessels and coronary arteries is noted.  The spiculated right lower lobe mass is similar to the prior examinations, measuring approximately 2.8 x 2.2 cm on image 28. Adjacent airspace disease more medially in the right lower lobe is stable. Likewise, the branching nodularity along the right tracheobronchial tree is stable.    CT ABDOMEN AND PELVIS FINDINGS  There is extensive hepatic metastatic disease which appears progressive. Many of the lesions are now confluent. Previously referenced lesions include a 7.7 x 5.4 cm lesion in the right lobe on image 39 (previously 5.6 x 4.6 cm) and a 6.6 x 4.6 cm lesion in the lateral segment of the left hepatic lobe on image 56 (previously 5.4 x 3.9 cm). Hepatomegaly has mildly progressed as well. There is extrinsic mass effect on the hepatic veins and IVC, although no large vessel  occlusion is identified.  There is a new small 1.6 x 1.1 cm left adrenal nodule which could reflect a small metastasis. The right adrenal gland appears normal. The spleen, gallbladder, pancreas and kidneys appear unremarkable. There is no hydronephrosis.  Confluent retroperitoneal lymphadenopathy is similar to the prior examination. There is a a 1.3 cm left periaortic node on image 64. No ascites or peritoneal nodularity is identified.  There is diffuse aortoiliac atherosclerosis. There is no evidence of focal mucosal lesion or bowel obstruction. Uterine lobularity is stable. There  is no evidence of enlarging adnexal mass or bladder lesion.  There are no worrisome osseous findings. The lucent lesion involving the upper right sacrum is unchanged, not hypermetabolic on PET.    IMPRESSION: 1. Progressive multifocal hepatic metastatic disease. 2. Possible new small left adrenal metastasis. 3. Multifocal supraclavicular, mediastinal, hilar and retroperitoneal adenopathy is grossly stable from the referenced priors. Likewise, the primary right lower lobe mass and possible lymphangitic spread within the right lung are stable.   Electronically Signed   By: Camie Patience M.D.   On: 11/09/2013 16:01   ASSESSMENT AND PLAN: This is a very pleasant 63 years old white female recently diagnosed with metastatic non-small cell lung cancer, adenocarcinoma presented with right lower lobe lung mass in addition to mediastinal and supraclavicular lymphadenopathy as well as diffuse metastatic liver lesions and retroperitoneal lymphadenopathy. She underwent 3 cycles of systemic chemotherapy with carboplatin and Alimta but unfortunately has evidence for disease progression especially in the liver and new small left adrenal metastasis. I discussed the scan results and showed the images to the patient and her husband. I have a lengthy discussion with the patient and her husband about her current condition and treatment options. She was given the option of palliative care but I also recommended for her treatment with systemic chemotherapy in the form of cisplatin 100 mg/m2 on day 1 and gemcitabine 800 mg/M2 on days 1 and 8 every 3 weeks. I discussed with the patient and her husband the adverse effect of this treatment including but not limited to alopecia, myelosuppression, nausea and vomiting, peripheral neuropathy, liver or renal dysfunction. The patient would like to proceed with treatment as soon as possible. She will be scheduled to start the first dose of this treatment tomorrow 11/12/2013. She would come  back for followup visit in 3 weeks with the start of cycle #2.  She was advised to call immediately if she has any concerning symptoms in the interval. The patient voices understanding of current disease status and treatment options and is in agreement with the current care plan.  All questions were answered. The patient knows to call the clinic with any problems, questions or concerns. We can certainly see the patient much sooner if necessary.  Disclaimer: This note was dictated with voice recognition software. Similar sounding words can inadvertently be transcribed and may not be corrected upon review.

## 2013-11-12 ENCOUNTER — Other Ambulatory Visit (HOSPITAL_BASED_OUTPATIENT_CLINIC_OR_DEPARTMENT_OTHER): Payer: 59

## 2013-11-12 ENCOUNTER — Ambulatory Visit (HOSPITAL_BASED_OUTPATIENT_CLINIC_OR_DEPARTMENT_OTHER): Payer: 59

## 2013-11-12 VITALS — BP 118/65 | HR 79 | Temp 97.0°F

## 2013-11-12 DIAGNOSIS — C787 Secondary malignant neoplasm of liver and intrahepatic bile duct: Secondary | ICD-10-CM

## 2013-11-12 DIAGNOSIS — C3490 Malignant neoplasm of unspecified part of unspecified bronchus or lung: Secondary | ICD-10-CM

## 2013-11-12 DIAGNOSIS — Z5111 Encounter for antineoplastic chemotherapy: Secondary | ICD-10-CM

## 2013-11-12 DIAGNOSIS — C3430 Malignant neoplasm of lower lobe, unspecified bronchus or lung: Secondary | ICD-10-CM

## 2013-11-12 LAB — COMPREHENSIVE METABOLIC PANEL (CC13)
ALT: 82 U/L — ABNORMAL HIGH (ref 0–55)
ANION GAP: 14 meq/L — AB (ref 3–11)
AST: 119 U/L — ABNORMAL HIGH (ref 5–34)
Albumin: 3.2 g/dL — ABNORMAL LOW (ref 3.5–5.0)
Alkaline Phosphatase: 245 U/L — ABNORMAL HIGH (ref 40–150)
BUN: 18.4 mg/dL (ref 7.0–26.0)
CALCIUM: 9.9 mg/dL (ref 8.4–10.4)
CHLORIDE: 101 meq/L (ref 98–109)
CO2: 20 meq/L — AB (ref 22–29)
Creatinine: 0.7 mg/dL (ref 0.6–1.1)
GLUCOSE: 113 mg/dL (ref 70–140)
POTASSIUM: 4.7 meq/L (ref 3.5–5.1)
SODIUM: 134 meq/L — AB (ref 136–145)
TOTAL PROTEIN: 7.1 g/dL (ref 6.4–8.3)
Total Bilirubin: 0.28 mg/dL (ref 0.20–1.20)

## 2013-11-12 MED ORDER — DEXAMETHASONE SODIUM PHOSPHATE 20 MG/5ML IJ SOLN
12.0000 mg | Freq: Once | INTRAMUSCULAR | Status: AC
  Administered 2013-11-12: 12 mg via INTRAVENOUS

## 2013-11-12 MED ORDER — SODIUM CHLORIDE 0.9 % IV SOLN
Freq: Once | INTRAVENOUS | Status: AC
  Administered 2013-11-12: 10:00:00 via INTRAVENOUS

## 2013-11-12 MED ORDER — PALONOSETRON HCL INJECTION 0.25 MG/5ML
INTRAVENOUS | Status: AC
  Filled 2013-11-12: qty 5

## 2013-11-12 MED ORDER — SODIUM CHLORIDE 0.9 % IV SOLN
800.0000 mg/m2 | Freq: Once | INTRAVENOUS | Status: AC
  Administered 2013-11-12: 1178 mg via INTRAVENOUS
  Filled 2013-11-12: qty 30.98

## 2013-11-12 MED ORDER — SODIUM CHLORIDE 0.9 % IV SOLN
150.0000 mg | Freq: Once | INTRAVENOUS | Status: AC
  Administered 2013-11-12: 150 mg via INTRAVENOUS
  Filled 2013-11-12: qty 5

## 2013-11-12 MED ORDER — CISPLATIN CHEMO INJECTION 100MG/100ML
100.0000 mg/m2 | Freq: Once | INTRAVENOUS | Status: AC
  Administered 2013-11-12: 146 mg via INTRAVENOUS
  Filled 2013-11-12: qty 146

## 2013-11-12 MED ORDER — DEXAMETHASONE SODIUM PHOSPHATE 20 MG/5ML IJ SOLN
INTRAMUSCULAR | Status: AC
  Filled 2013-11-12: qty 5

## 2013-11-12 MED ORDER — PALONOSETRON HCL INJECTION 0.25 MG/5ML
0.2500 mg | Freq: Once | INTRAVENOUS | Status: AC
  Administered 2013-11-12: 0.25 mg via INTRAVENOUS

## 2013-11-12 MED ORDER — POTASSIUM CHLORIDE 2 MEQ/ML IV SOLN
Freq: Once | INTRAVENOUS | Status: AC
  Administered 2013-11-12: 10:00:00 via INTRAVENOUS
  Filled 2013-11-12: qty 10

## 2013-11-12 NOTE — Patient Instructions (Signed)
[facility] Discharge Instructions for Patients Receiving Chemotherapy  Today you received the following chemotherapy agents;  Gemzar and Cisplatin.   To help prevent nausea and vomiting after your treatment, we encourage you to take your nausea medication as directed.    If you develop nausea and vomiting that is not controlled by your nausea medication, call the clinic.   BELOW ARE SYMPTOMS THAT SHOULD BE REPORTED IMMEDIATELY:  *FEVER GREATER THAN 100.5 F  *CHILLS WITH OR WITHOUT FEVER  NAUSEA AND VOMITING THAT IS NOT CONTROLLED WITH YOUR NAUSEA MEDICATION  *UNUSUAL SHORTNESS OF BREATH  *UNUSUAL BRUISING OR BLEEDING  TENDERNESS IN MOUTH AND THROAT WITH OR WITHOUT PRESENCE OF ULCERS  *URINARY PROBLEMS  *BOWEL PROBLEMS  UNUSUAL RASH Items with * indicate a potential emergency and should be followed up as soon as possible.  Feel free to call the clinic you have any questions or concerns. The clinic phone number is [phone].

## 2013-11-12 NOTE — Progress Notes (Signed)
Pt reported voiding 250 cc urine  And additional 275 cc prior to initiating Cisplatin.  Pt voided another 250 cc and then 270 cc prior to completion of treatment today.

## 2013-11-13 ENCOUNTER — Telehealth: Payer: Self-pay | Admitting: *Deleted

## 2013-11-13 NOTE — Telephone Encounter (Signed)
Message copied by Verlon Setting on Fri Nov 13, 2013 12:36 PM ------      Message from: Ricarda Frame C      Created: Thu Nov 12, 2013  2:58 PM      Regarding: chemo f/u       Change in therapy.  1st time Gemzar and Cisplatin 11/12/13.  ------

## 2013-11-13 NOTE — Telephone Encounter (Signed)
Called patient for chemo follow up following Cisplatin/Gemzar. She states she did have a wave of nausea yesterday, no vomiting and did get relief from Compazine. Suggested to patient to take Compazine today and tomorrow at least once daily to prevent the nausea. Patient states she is drinking plenty of water and her intake is pretty good. Encouraged patient to call should she have any new problems. Spouse was asking about dental procedures, her dentist is wanting to do some work and deaden the nerves due to sensitivity, however, patient states that she does ok with just using her Sensidine toothpaste. Instructed patient and spouse that we discourage any elective dental procedures during active chemo.

## 2013-11-18 ENCOUNTER — Other Ambulatory Visit: Payer: 59

## 2013-11-19 ENCOUNTER — Other Ambulatory Visit (HOSPITAL_BASED_OUTPATIENT_CLINIC_OR_DEPARTMENT_OTHER): Payer: 59

## 2013-11-19 ENCOUNTER — Ambulatory Visit (HOSPITAL_BASED_OUTPATIENT_CLINIC_OR_DEPARTMENT_OTHER): Payer: 59

## 2013-11-19 VITALS — BP 134/73 | HR 78 | Temp 98.0°F | Resp 18

## 2013-11-19 DIAGNOSIS — C3430 Malignant neoplasm of lower lobe, unspecified bronchus or lung: Secondary | ICD-10-CM

## 2013-11-19 DIAGNOSIS — C787 Secondary malignant neoplasm of liver and intrahepatic bile duct: Secondary | ICD-10-CM

## 2013-11-19 DIAGNOSIS — C3490 Malignant neoplasm of unspecified part of unspecified bronchus or lung: Secondary | ICD-10-CM

## 2013-11-19 DIAGNOSIS — Z5111 Encounter for antineoplastic chemotherapy: Secondary | ICD-10-CM

## 2013-11-19 LAB — COMPREHENSIVE METABOLIC PANEL (CC13)
ALT: 64 U/L — AB (ref 0–55)
AST: 58 U/L — ABNORMAL HIGH (ref 5–34)
Albumin: 3 g/dL — ABNORMAL LOW (ref 3.5–5.0)
Alkaline Phosphatase: 225 U/L — ABNORMAL HIGH (ref 40–150)
Anion Gap: 11 mEq/L (ref 3–11)
BUN: 17.5 mg/dL (ref 7.0–26.0)
CALCIUM: 9.6 mg/dL (ref 8.4–10.4)
CO2: 23 meq/L (ref 22–29)
Chloride: 96 mEq/L — ABNORMAL LOW (ref 98–109)
Creatinine: 0.7 mg/dL (ref 0.6–1.1)
Glucose: 99 mg/dl (ref 70–140)
Potassium: 4.9 mEq/L (ref 3.5–5.1)
SODIUM: 131 meq/L — AB (ref 136–145)
TOTAL PROTEIN: 6.9 g/dL (ref 6.4–8.3)
Total Bilirubin: 0.47 mg/dL (ref 0.20–1.20)

## 2013-11-19 LAB — CBC WITH DIFFERENTIAL/PLATELET
BASO%: 0.2 % (ref 0.0–2.0)
BASOS ABS: 0 10*3/uL (ref 0.0–0.1)
EOS%: 0 % (ref 0.0–7.0)
Eosinophils Absolute: 0 10*3/uL (ref 0.0–0.5)
HCT: 31.1 % — ABNORMAL LOW (ref 34.8–46.6)
HEMOGLOBIN: 10.8 g/dL — AB (ref 11.6–15.9)
LYMPH%: 36.9 % (ref 14.0–49.7)
MCH: 30.9 pg (ref 25.1–34.0)
MCHC: 34.7 g/dL (ref 31.5–36.0)
MCV: 89.1 fL (ref 79.5–101.0)
MONO#: 0.6 10*3/uL (ref 0.1–0.9)
MONO%: 11.1 % (ref 0.0–14.0)
NEUT%: 51.8 % (ref 38.4–76.8)
NEUTROS ABS: 2.9 10*3/uL (ref 1.5–6.5)
NRBC: 0 % (ref 0–0)
PLATELETS: 221 10*3/uL (ref 145–400)
RBC: 3.49 10*6/uL — AB (ref 3.70–5.45)
RDW: 15.9 % — AB (ref 11.2–14.5)
WBC: 5.6 10*3/uL (ref 3.9–10.3)
lymph#: 2.1 10*3/uL (ref 0.9–3.3)

## 2013-11-19 LAB — MAGNESIUM (CC13): Magnesium: 1.9 mg/dl (ref 1.5–2.5)

## 2013-11-19 MED ORDER — PROCHLORPERAZINE MALEATE 10 MG PO TABS
ORAL_TABLET | ORAL | Status: AC
  Filled 2013-11-19: qty 1

## 2013-11-19 MED ORDER — PROCHLORPERAZINE MALEATE 10 MG PO TABS
10.0000 mg | ORAL_TABLET | Freq: Once | ORAL | Status: AC
  Administered 2013-11-19: 10 mg via ORAL

## 2013-11-19 MED ORDER — SODIUM CHLORIDE 0.9 % IV SOLN
Freq: Once | INTRAVENOUS | Status: AC
  Administered 2013-11-19: 14:00:00 via INTRAVENOUS

## 2013-11-19 MED ORDER — SODIUM CHLORIDE 0.9 % IV SOLN
800.0000 mg/m2 | Freq: Once | INTRAVENOUS | Status: AC
  Administered 2013-11-19: 1178 mg via INTRAVENOUS
  Filled 2013-11-19: qty 30.98

## 2013-11-19 NOTE — Progress Notes (Signed)
Patient reports hearing "crickets or something that sounds like an electrical circuit" in both ears for the past three days. She also reports not being able to hear as well in both ears. Diane, Dr. Worthy Flank RN notified of this. Cindi Carbon, RN

## 2013-11-19 NOTE — Patient Instructions (Signed)
[facility] Discharge Instructions for Patients Receiving Chemotherapy  Today you received the following chemotherapy agents: Gemzar  To help prevent nausea and vomiting after your treatment, we encourage you to take your nausea medication as prescribed.    If you develop nausea and vomiting that is not controlled by your nausea medication, call the clinic.   BELOW ARE SYMPTOMS THAT SHOULD BE REPORTED IMMEDIATELY:  *FEVER GREATER THAN 100.5 F  *CHILLS WITH OR WITHOUT FEVER  NAUSEA AND VOMITING THAT IS NOT CONTROLLED WITH YOUR NAUSEA MEDICATION  *UNUSUAL SHORTNESS OF BREATH  *UNUSUAL BRUISING OR BLEEDING  TENDERNESS IN MOUTH AND THROAT WITH OR WITHOUT PRESENCE OF ULCERS  *URINARY PROBLEMS  *BOWEL PROBLEMS  UNUSUAL RASH Items with * indicate a potential emergency and should be followed up as soon as possible.  Feel free to call the clinic you have any questions or concerns. The clinic phone number is [phone].   Cisplatin injection What is this medicine? CISPLATIN (SIS pla tin) is a chemotherapy drug. It targets fast dividing cells, like cancer cells, and causes these cells to die. This medicine is used to treat many types of cancer like bladder, ovarian, and testicular cancers. This medicine may be used for other purposes; ask your health care provider or pharmacist if you have questions. COMMON BRAND NAME(S): Platinol -AQ, Platinol What should I tell my health care provider before I take this medicine? They need to know if you have any of these conditions: -blood disorders -hearing problems -kidney disease -recent or ongoing radiation therapy -an unusual or allergic reaction to cisplatin, carboplatin, other chemotherapy, other medicines, foods, dyes, or preservatives -pregnant or trying to get pregnant -breast-feeding How should I use this medicine? This drug is given as an infusion into a vein. It is administered in a hospital or clinic by a  specially trained health care professional. Talk to your pediatrician regarding the use of this medicine in children. Special care may be needed. Overdosage: If you think you have taken too much of this medicine contact a poison control center or emergency room at once. NOTE: This medicine is only for you. Do not share this medicine with others. What if I miss a dose? It is important not to miss a dose. Call your doctor or health care professional if you are unable to keep an appointment. What may interact with this medicine? -dofetilide -foscarnet -medicines for seizures -medicines to increase blood counts like filgrastim, pegfilgrastim, sargramostim -probenecid -pyridoxine used with altretamine -rituximab -some antibiotics like amikacin, gentamicin, neomycin, polymyxin B, streptomycin, tobramycin -sulfinpyrazone -vaccines -zalcitabine Talk to your doctor or health care professional before taking any of these medicines: -acetaminophen -aspirin -ibuprofen -ketoprofen -naproxen This list may not describe all possible interactions. Give your health care provider a list of all the medicines, herbs, non-prescription drugs, or dietary supplements you use. Also tell them if you smoke, drink alcohol, or use illegal drugs. Some items may interact with your medicine. What should I watch for while using this medicine? Your condition will be monitored carefully while you are receiving this medicine. You will need important blood work done while you are taking this medicine. This drug may make you feel generally unwell. This is not uncommon, as chemotherapy can affect healthy cells as well as cancer cells. Report any side effects. Continue your course of treatment even though you feel ill unless your doctor tells you to stop. In some cases, you may be given additional medicines to help with side effects. Follow  all directions for their use. Call your doctor or health care professional for advice if  you get a fever, chills or sore throat, or other symptoms of a cold or flu. Do not treat yourself. This drug decreases your body's ability to fight infections. Try to avoid being around people who are sick. This medicine may increase your risk to bruise or bleed. Call your doctor or health care professional if you notice any unusual bleeding. Be careful brushing and flossing your teeth or using a toothpick because you may get an infection or bleed more easily. If you have any dental work done, tell your dentist you are receiving this medicine. Avoid taking products that contain aspirin, acetaminophen, ibuprofen, naproxen, or ketoprofen unless instructed by your doctor. These medicines may hide a fever. Do not become pregnant while taking this medicine. Women should inform their doctor if they wish to become pregnant or think they might be pregnant. There is a potential for serious side effects to an unborn child. Talk to your health care professional or pharmacist for more information. Do not breast-feed an infant while taking this medicine. Drink fluids as directed while you are taking this medicine. This will help protect your kidneys. Call your doctor or health care professional if you get diarrhea. Do not treat yourself. What side effects may I notice from receiving this medicine? Side effects that you should report to your doctor or health care professional as soon as possible: -allergic reactions like skin rash, itching or hives, swelling of the face, lips, or tongue -signs of infection - fever or chills, cough, sore throat, pain or difficulty passing urine -signs of decreased platelets or bleeding - bruising, pinpoint red spots on the skin, black, tarry stools, nosebleeds -signs of decreased red blood cells - unusually weak or tired, fainting spells, lightheadedness -breathing problems -changes in hearing -gout pain -low blood counts - This drug may decrease the number of white blood cells,  red blood cells and platelets. You may be at increased risk for infections and bleeding. -nausea and vomiting -pain, swelling, redness or irritation at the injection site -pain, tingling, numbness in the hands or feet -problems with balance, movement -trouble passing urine or change in the amount of urine Side effects that usually do not require medical attention (report to your doctor or health care professional if they continue or are bothersome): -changes in vision -loss of appetite -metallic taste in the mouth or changes in taste This list may not describe all possible side effects. Call your doctor for medical advice about side effects. You may report side effects to FDA at 1-800-FDA-1088. Where should I keep my medicine? This drug is given in a hospital or clinic and will not be stored at home. NOTE: This sheet is a summary. It may not cover all possible information. If you have questions about this medicine, talk to your doctor, pharmacist, or health care provider.  2014, Elsevier/Gold Standard. (2007-11-18 14:40:54)  Gemcitabine injection - Gemzar What is this medicine? GEMCITABINE (jem SIT a been) is a chemotherapy drug. This medicine is used to treat many types of cancer like breast cancer, lung cancer, pancreatic cancer, and ovarian cancer. This medicine may be used for other purposes; ask your health care provider or pharmacist if you have questions. COMMON BRAND NAME(S): Gemzar What should I tell my health care provider before I take this medicine? They need to know if you have any of these conditions: -blood disorders -infection -kidney disease -liver disease -recent or ongoing  radiation therapy -an unusual or allergic reaction to gemcitabine, other chemotherapy, other medicines, foods, dyes, or preservatives -pregnant or trying to get pregnant -breast-feeding How should I use this medicine? This drug is given as an infusion into a vein. It is administered in a hospital  or clinic by a specially trained health care professional. Talk to your pediatrician regarding the use of this medicine in children. Special care may be needed. Overdosage: If you think you have taken too much of this medicine contact a poison control center or emergency room at once. NOTE: This medicine is only for you. Do not share this medicine with others. What if I miss a dose? It is important not to miss your dose. Call your doctor or health care professional if you are unable to keep an appointment. What may interact with this medicine? -medicines to increase blood counts like filgrastim, pegfilgrastim, sargramostim -some other chemotherapy drugs like cisplatin -vaccines Talk to your doctor or health care professional before taking any of these medicines: -acetaminophen -aspirin -ibuprofen -ketoprofen -naproxen This list may not describe all possible interactions. Give your health care provider a list of all the medicines, herbs, non-prescription drugs, or dietary supplements you use. Also tell them if you smoke, drink alcohol, or use illegal drugs. Some items may interact with your medicine. What should I watch for while using this medicine? Visit your doctor for checks on your progress. This drug may make you feel generally unwell. This is not uncommon, as chemotherapy can affect healthy cells as well as cancer cells. Report any side effects. Continue your course of treatment even though you feel ill unless your doctor tells you to stop. In some cases, you may be given additional medicines to help with side effects. Follow all directions for their use. Call your doctor or health care professional for advice if you get a fever, chills or sore throat, or other symptoms of a cold or flu. Do not treat yourself. This drug decreases your body's ability to fight infections. Try to avoid being around people who are sick. This medicine may increase your risk to bruise or bleed. Call your doctor  or health care professional if you notice any unusual bleeding. Be careful brushing and flossing your teeth or using a toothpick because you may get an infection or bleed more easily. If you have any dental work done, tell your dentist you are receiving this medicine. Avoid taking products that contain aspirin, acetaminophen, ibuprofen, naproxen, or ketoprofen unless instructed by your doctor. These medicines may hide a fever. Women should inform their doctor if they wish to become pregnant or think they might be pregnant. There is a potential for serious side effects to an unborn child. Talk to your health care professional or pharmacist for more information. Do not breast-feed an infant while taking this medicine. What side effects may I notice from receiving this medicine? Side effects that you should report to your doctor or health care professional as soon as possible: -allergic reactions like skin rash, itching or hives, swelling of the face, lips, or tongue -low blood counts - this medicine may decrease the number of white blood cells, red blood cells and platelets. You may be at increased risk for infections and bleeding. -signs of infection - fever or chills, cough, sore throat, pain or difficulty passing urine -signs of decreased platelets or bleeding - bruising, pinpoint red spots on the skin, black, tarry stools, blood in the urine -signs of decreased red blood cells - unusually  weak or tired, fainting spells, lightheadedness -breathing problems -chest pain -mouth sores -nausea and vomiting -pain, swelling, redness at site where injected -pain, tingling, numbness in the hands or feet -stomach pain -swelling of ankles, feet, hands -unusual bleeding Side effects that usually do not require medical attention (report to your doctor or health care professional if they continue or are bothersome): -constipation -diarrhea -hair loss -loss of appetite -stomach upset This list may not  describe all possible side effects. Call your doctor for medical advice about side effects. You may report side effects to FDA at 1-800-FDA-1088. Where should I keep my medicine? This drug is given in a hospital or clinic and will not be stored at home. NOTE: This sheet is a summary. It may not cover all possible information. If you have questions about this medicine, talk to your doctor, pharmacist, or health care provider.  2014, Elsevier/Gold Standard. (2007-12-23 18:45:54)

## 2013-11-24 ENCOUNTER — Telehealth: Payer: Self-pay | Admitting: Internal Medicine

## 2013-11-24 NOTE — Telephone Encounter (Signed)
per staff message from [facility] pt can see AJ 1-2 days before chemo. lmonvm for pt re appts for 4/8 and 4/9. schedule mailed.

## 2013-11-25 ENCOUNTER — Other Ambulatory Visit: Payer: 59

## 2013-12-02 ENCOUNTER — Telehealth: Payer: Self-pay | Admitting: Internal Medicine

## 2013-12-02 ENCOUNTER — Ambulatory Visit: Payer: 59

## 2013-12-02 ENCOUNTER — Other Ambulatory Visit (HOSPITAL_BASED_OUTPATIENT_CLINIC_OR_DEPARTMENT_OTHER): Payer: 59

## 2013-12-02 ENCOUNTER — Encounter: Payer: Self-pay | Admitting: Physician Assistant

## 2013-12-02 ENCOUNTER — Other Ambulatory Visit: Payer: Self-pay

## 2013-12-02 ENCOUNTER — Ambulatory Visit (HOSPITAL_BASED_OUTPATIENT_CLINIC_OR_DEPARTMENT_OTHER): Payer: 59 | Admitting: Physician Assistant

## 2013-12-02 ENCOUNTER — Other Ambulatory Visit: Payer: 59

## 2013-12-02 VITALS — BP 130/80 | HR 78 | Temp 97.9°F | Resp 18 | Ht 62.0 in | Wt 105.1 lb

## 2013-12-02 DIAGNOSIS — R519 Headache, unspecified: Secondary | ICD-10-CM

## 2013-12-02 DIAGNOSIS — C7970 Secondary malignant neoplasm of unspecified adrenal gland: Secondary | ICD-10-CM

## 2013-12-02 DIAGNOSIS — H9190 Unspecified hearing loss, unspecified ear: Secondary | ICD-10-CM

## 2013-12-02 DIAGNOSIS — C787 Secondary malignant neoplasm of liver and intrahepatic bile duct: Secondary | ICD-10-CM

## 2013-12-02 DIAGNOSIS — C3430 Malignant neoplasm of lower lobe, unspecified bronchus or lung: Secondary | ICD-10-CM

## 2013-12-02 DIAGNOSIS — R51 Headache: Secondary | ICD-10-CM

## 2013-12-02 DIAGNOSIS — C3490 Malignant neoplasm of unspecified part of unspecified bronchus or lung: Secondary | ICD-10-CM

## 2013-12-02 DIAGNOSIS — H539 Unspecified visual disturbance: Secondary | ICD-10-CM

## 2013-12-02 LAB — COMPREHENSIVE METABOLIC PANEL (CC13)
ALT: 18 U/L (ref 0–55)
ANION GAP: 8 meq/L (ref 3–11)
AST: 24 U/L (ref 5–34)
Albumin: 3.1 g/dL — ABNORMAL LOW (ref 3.5–5.0)
Alkaline Phosphatase: 165 U/L — ABNORMAL HIGH (ref 40–150)
BILIRUBIN TOTAL: 0.23 mg/dL (ref 0.20–1.20)
BUN: 9.4 mg/dL (ref 7.0–26.0)
CALCIUM: 9.8 mg/dL (ref 8.4–10.4)
CHLORIDE: 103 meq/L (ref 98–109)
CO2: 26 meq/L (ref 22–29)
Creatinine: 0.6 mg/dL (ref 0.6–1.1)
GLUCOSE: 87 mg/dL (ref 70–140)
Potassium: 4.2 mEq/L (ref 3.5–5.1)
SODIUM: 137 meq/L (ref 136–145)
TOTAL PROTEIN: 6.7 g/dL (ref 6.4–8.3)

## 2013-12-02 LAB — CBC WITH DIFFERENTIAL/PLATELET
BASO%: 0.5 % (ref 0.0–2.0)
Basophils Absolute: 0 10*3/uL (ref 0.0–0.1)
EOS ABS: 0 10*3/uL (ref 0.0–0.5)
EOS%: 0.6 % (ref 0.0–7.0)
HCT: 27.9 % — ABNORMAL LOW (ref 34.8–46.6)
HGB: 9.4 g/dL — ABNORMAL LOW (ref 11.6–15.9)
LYMPH%: 43 % (ref 14.0–49.7)
MCH: 31.4 pg (ref 25.1–34.0)
MCHC: 33.7 g/dL (ref 31.5–36.0)
MCV: 93.4 fL (ref 79.5–101.0)
MONO#: 1 10*3/uL — AB (ref 0.1–0.9)
MONO%: 21 % — ABNORMAL HIGH (ref 0.0–14.0)
NEUT%: 34.9 % — ABNORMAL LOW (ref 38.4–76.8)
NEUTROS ABS: 1.7 10*3/uL (ref 1.5–6.5)
PLATELETS: 559 10*3/uL — AB (ref 145–400)
RBC: 2.99 10*6/uL — ABNORMAL LOW (ref 3.70–5.45)
RDW: 17.7 % — ABNORMAL HIGH (ref 11.2–14.5)
WBC: 4.8 10*3/uL (ref 3.9–10.3)
lymph#: 2 10*3/uL (ref 0.9–3.3)

## 2013-12-02 LAB — MAGNESIUM (CC13): MAGNESIUM: 1.6 mg/dL (ref 1.5–2.5)

## 2013-12-02 MED ORDER — PROCHLORPERAZINE MALEATE 10 MG PO TABS: 10.0000 mg | ORAL_TABLET | Freq: Four times a day (QID) | ORAL | Status: DC | PRN

## 2013-12-02 NOTE — Telephone Encounter (Signed)
gave pt appt for lab,md and chemo for April and May 2015

## 2013-12-02 NOTE — Progress Notes (Addendum)
[facility] [phone]   Fax:([phone]) [address] [place] [address]  DIAGNOSIS: Stage IV (T1b, N2, M1b) non-small cell lung cancer, adenocarcinoma, presented with a right lower lobe lung mass as well as mediastinal, hilar, bilateral supraclavicular lymphadenopathy in addition to extensive liver metastases and retroperitoneal lymphadenopathy diagnosed in December of 2014.  MOLECULAR BIOMARKERS: [facility] ONE biomarker testing revealed TP53 E204*, KEAP1 R667f*25+, NFKBIA amplification, NKX2-1 amplification, SMARCA4 E463*. The patient was negative for EGFR and ALK gene translocation   PRIOR THERAPY: Systemic chemotherapy with carboplatin for AUC of 5 and Alimta 500 mg/M2 every 3 weeks, status post 3 cycles. First dose 09/02/2013.   CURRENT THERAPY: Systemic chemotherapy with cisplatin 100 mg/M2 on days 1 and gemcitabine 800 mg/M2 on days 1 and 8 every 3 weeks, first cycle on 11/12/2013. Status post 1 cycle   CHEMOTHERAPY INTENT: palliative  CURRENT # OF CHEMOTHERAPY CYCLES: 1  CURRENT ANTIEMETICS: Zofran, dexamethasone and Compazine.  CURRENT SMOKING STATUS: former smoker  ORAL CHEMOTHERAPY AND CONSENT: None  CURRENT BISPHOSPHONATES USE: None  PAIN MANAGEMENT: 0/10  NARCOTICS INDUCED CONSTIPATION: None  LIVING WILL AND CODE STATUS: Full Code  INTERVAL HISTORY: JTZIPPORAH NAGORSKI89y.[id number]. female returns to the clinic today for follow up visit accompanied by her husband, MRonalee Belts She reports a lot of fatigue as well as some nausea without vomiting. She complains of a frontal headache. She hasn't vision changes although she has a known cataract in her right eye. She noted decreased hearing about 4 or 5 days after day 1 of her chemotherapy. She describes it as a white no weighs Titus sound almost like she's anatomical. Her husband feels as are her voice has changed also. He states  that it crackles more and seems more hoarse. She's lost a little weight. She complains of not sleeping well perfectly after day 1 of the first cycle of chemotherapy. She also had an issue with constipation however this resolved with MiraLAX. In taking oxycodone for the headache as she was unaware that she did take Tylenol. She requests a refill for the oxycodone.    MEDICAL HISTORY: Past Medical History  Diagnosis Date  . HTN (hypertension)     ALLERGIES:  is allergic to hctz and plendil.  MEDICATIONS:  Current Outpatient Prescriptions  Medication Sig Dispense Refill  . amLODipine (NORVASC) 10 MG tablet Take 10 mg by mouth daily.      .Marland Kitchenaspirin EC 81 MG tablet Take 81 mg by mouth daily.      . calcium carbonate (TUMS - DOSED IN MG ELEMENTAL CALCIUM) 500 MG chewable tablet Chew 1 tablet by mouth daily as needed for indigestion or heartburn.      . folic acid (FOLVITE) 1 MG tablet Take 1 tablet (1 mg total) by mouth daily.  30 tablet  4  . lisinopril (PRINIVIL,ZESTRIL) 20 MG tablet Take 20 mg by mouth daily.      . nebivolol (BYSTOLIC) 10 MG tablet Take 10 mg by mouth daily.      .Marland KitchenoxyCODONE (ROXICODONE) 5 MG immediate release tablet Take 1 tablet (5 mg total) by mouth every 6 (six) hours as needed for severe pain.  60 tablet  0  . prochlorperazine (COMPAZINE) 10 MG tablet Take 1 tablet (10 mg total) by mouth every 6 (six) hours as needed for nausea or vomiting.  60 tablet  1   No current facility-administered  medications for this visit.    REVIEW OF SYSTEMS:  Constitutional: positive for fatigue and problems sleeping Eyes: positive for cataracts and visual disturbance Ears, nose, mouth, throat, and face: positive for hearing loss, hoarseness and voice change Respiratory: negative Cardiovascular: negative Gastrointestinal: positive for constipation and nausea Genitourinary:negative Integument/breast: negative Hematologic/lymphatic: negative Musculoskeletal:negative Neurological:  negative Behavioral/Psych: negative Endocrine: negative Allergic/Immunologic: negative   PHYSICAL EXAMINATION: General appearance: alert, cooperative and no distress Head: Normocephalic, without obvious abnormality, atraumatic Neck: no adenopathy, no JVD, supple, symmetrical, trachea midline and thyroid not enlarged, symmetric, no tenderness/mass/nodules Lymph nodes: Cervical, supraclavicular, and axillary nodes normal. Resp: clear to auscultation bilaterally Back: symmetric, no curvature. ROM normal. No CVA tenderness. Cardio: regular rate and rhythm, S1, S2 normal, no murmur, click, rub or gallop GI: soft, non-tender; bowel sounds normal; no masses,  no organomegaly Extremities: extremities normal, atraumatic, no cyanosis or edema Neurologic: Alert and oriented X 3, normal strength and tone. Normal symmetric reflexes. Normal coordination and gait  ECOG PERFORMANCE STATUS: 1 - Symptomatic but completely ambulatory  Blood pressure 130/80, pulse 78, temperature 97.9 F (36.6 C), temperature source Oral, resp. rate 18, height 5' 2"  (1.575 m), weight 105 lb 1.6 oz (47.673 kg).  LABORATORY DATA: Lab Results  Component Value Date   WBC 4.8 12/02/2013   HGB 9.4* 12/02/2013   HCT 27.9* 12/02/2013   MCV 93.4 12/02/2013   PLT 559* 12/02/2013      Chemistry      Component Value Date/Time   NA 137 12/02/2013 1420   K 4.2 12/02/2013 1420   CO2 26 12/02/2013 1420   BUN 9.4 12/02/2013 1420   CREATININE 0.6 12/02/2013 1420   CREATININE 0.80 08/21/2013 0800      Component Value Date/Time   CALCIUM 9.8 12/02/2013 1420       RADIOGRAPHIC STUDIES:  Ct Chest W Contrast  11/09/2013   CLINICAL DATA:  Metastatic lung cancer. Restaging. Chemotherapy ongoing.  EXAM: CT CHEST, ABDOMEN, AND PELVIS WITH CONTRAST  TECHNIQUE: Multidetector CT imaging of the chest, abdomen and pelvis was performed following the standard protocol during bolus administration of intravenous contrast.  CONTRAST:  175m OMNIPAQUE IOHEXOL  300 MG/ML  SOLN  COMPARISON:  NM PET IMAGE INITIAL (PI) SKULL BASE TO THIGH dated 08/26/2013; [place] dated 08/12/2013; CT ABD/PELVIS W CM dated 08/07/2013; CT CHEST W/O CM SCREENING dated 08/05/2013  FINDINGS:   CT CHEST FINDINGS  Left supraclavicular adenopathy is similar to prior PET-CT with nodes measuring 8 and 9 mm short axis on image 5. There are several residual enlarged mediastinal lymph nodes, including a 10 mm AP window node on image 19, a 12 mm subcarinal node on image 23 and an 11 mm left hilar node on image 24.  There are small bilateral pleural effusions. There is no significant pericardial effusion. Atherosclerosis of the aorta, great vessels and coronary arteries is noted.  The spiculated right lower lobe mass is similar to the prior examinations, measuring approximately 2.8 x 2.2 cm on image 28. Adjacent airspace disease more medially in the right lower lobe is stable. Likewise, the branching nodularity along the right tracheobronchial tree is stable.    CT ABDOMEN AND PELVIS FINDINGS  There is extensive hepatic metastatic disease which appears progressive. Many of the lesions are now confluent. Previously referenced lesions include a 7.7 x 5.4 cm lesion in the right lobe on image 39 (previously 5.6 x 4.6 cm) and a 6.6 x 4.6 cm lesion in the lateral segment of the left  hepatic lobe on image 56 (previously 5.4 x 3.9 cm). Hepatomegaly has mildly progressed as well. There is extrinsic mass effect on the hepatic veins and IVC, although no large vessel occlusion is identified.  There is a new small 1.6 x 1.1 cm left adrenal nodule which could reflect a small metastasis. The right adrenal gland appears normal. The spleen, gallbladder, pancreas and kidneys appear unremarkable. There is no hydronephrosis.  Confluent retroperitoneal lymphadenopathy is similar to the prior examination. There is a a 1.3 cm left periaortic node on image 64. No ascites or peritoneal nodularity is identified.  There is  diffuse aortoiliac atherosclerosis. There is no evidence of focal mucosal lesion or bowel obstruction. Uterine lobularity is stable. There is no evidence of enlarging adnexal mass or bladder lesion.  There are no worrisome osseous findings. The lucent lesion involving the upper right sacrum is unchanged, not hypermetabolic on PET.    IMPRESSION: 1. Progressive multifocal hepatic metastatic disease. 2. Possible new small left adrenal metastasis. 3. Multifocal supraclavicular, mediastinal, hilar and retroperitoneal adenopathy is grossly stable from the referenced priors. Likewise, the primary right lower lobe mass and possible lymphangitic spread within the right lung are stable.   Electronically Signed   By: Camie Patience M.D.   On: 11/09/2013 16:01   ASSESSMENT AND PLAN: This is a very pleasant 63 years old white female recently diagnosed with metastatic non-small cell lung cancer, adenocarcinoma presented with right lower lobe lung mass in addition to mediastinal and supraclavicular lymphadenopathy as well as diffuse metastatic liver lesions and retroperitoneal lymphadenopathy. She underwent 3 cycles of systemic chemotherapy with carboplatin and Alimta but unfortunately has evidence for disease progression especially in the liver and new small left adrenal metastasis. She is currently receiving  systemic chemotherapy in the form of cisplatin 100 mg/m2 on day 1 and gemcitabine 800 mg/M2 on days 1 and 8 every 3 weeks. Status post 1 cycle. Patient was discussed with and also seen by Dr. Julien Nordmann. The decreased hearing is concerning and may be related to the cisplatin. The cisplatin will be dose reduced starting with cycle #2 going forward. The combination of the decreased hearing, headache and vision changes despite her known cataracts is also concerning for possible brain metastasis. We'll schedule the patient for an MRI of the brain within the week to evaluate this further.  She return for a followup visit in  3 weeks with the start of cycle #3  She was advised to call immediately if she has any concerning symptoms in the interval. The patient voices understanding of current disease status and treatment options and is in agreement with the current care plan.  All questions were answered. The patient knows to call the clinic with any problems, questions or concerns. We can certainly see the patient much sooner if necessary.  Carlton Adam, PA-C  ADDENDUM: Hematology/Oncology:  I had a face to face encounter with the patient. I recommended her care plan. This is a very pleasant 63 years old white female with stage IV non-small cell carcinoma of questionable lung primary currently undergoing systemic chemotherapy with cisplatin and gemcitabine status post 1 cycle. The patient has been complaining of ringing in her ears started after the first cycle of this treatment. It is slightly better today. This is most likely secondary to cisplatin. I discussed with the patient several options for management of her condition including switching cisplatin to carboplatin or reducing the dose of cisplatin to 80 mg/M2. The patient would like to proceed with  one more cycle of cisplatin to see if she would have the same adverse effect. If she continues to have significant tingling in her ear or peripheral neuropathy, I would consider switching her treatment to carboplatin and gemcitabine. The patient also has been complaining of headache as well as blurry vision. We will order MRI of the brain to rule out brain metastasis. She would come back for follow up visit in 3 weeks for evaluation before starting cycle #3. She was advised to call immediately if she has any concerning symptoms in the interval.  Disclaimer: This note was dictated with voice recognition software. Similar sounding words can inadvertently be transcribed and may not be corrected upon review. Curt Bears, MD 12/05/2013

## 2013-12-03 ENCOUNTER — Other Ambulatory Visit: Payer: 59

## 2013-12-03 ENCOUNTER — Encounter: Payer: Self-pay | Admitting: Internal Medicine

## 2013-12-03 ENCOUNTER — Ambulatory Visit (HOSPITAL_BASED_OUTPATIENT_CLINIC_OR_DEPARTMENT_OTHER): Payer: 59

## 2013-12-03 VITALS — BP 126/66 | HR 75 | Temp 97.7°F | Resp 18 | Ht 62.0 in

## 2013-12-03 DIAGNOSIS — C3490 Malignant neoplasm of unspecified part of unspecified bronchus or lung: Secondary | ICD-10-CM

## 2013-12-03 DIAGNOSIS — C3430 Malignant neoplasm of lower lobe, unspecified bronchus or lung: Secondary | ICD-10-CM

## 2013-12-03 DIAGNOSIS — C787 Secondary malignant neoplasm of liver and intrahepatic bile duct: Secondary | ICD-10-CM

## 2013-12-03 DIAGNOSIS — Z5111 Encounter for antineoplastic chemotherapy: Secondary | ICD-10-CM

## 2013-12-03 MED ORDER — SODIUM CHLORIDE 0.9 % IV SOLN
100.0000 mg/m2 | Freq: Once | INTRAVENOUS | Status: AC
  Administered 2013-12-03: 146 mg via INTRAVENOUS
  Filled 2013-12-03: qty 146

## 2013-12-03 MED ORDER — SODIUM CHLORIDE 0.9 % IV SOLN
Freq: Once | INTRAVENOUS | Status: AC
  Administered 2013-12-03: 09:00:00 via INTRAVENOUS

## 2013-12-03 MED ORDER — SODIUM CHLORIDE 0.9 % IV SOLN
150.0000 mg | Freq: Once | INTRAVENOUS | Status: AC
  Administered 2013-12-03: 150 mg via INTRAVENOUS
  Filled 2013-12-03: qty 5

## 2013-12-03 MED ORDER — DEXAMETHASONE SODIUM PHOSPHATE 20 MG/5ML IJ SOLN
INTRAMUSCULAR | Status: AC
  Filled 2013-12-03: qty 5

## 2013-12-03 MED ORDER — SODIUM CHLORIDE 0.9 % IV SOLN
800.0000 mg/m2 | Freq: Once | INTRAVENOUS | Status: AC
  Administered 2013-12-03: 1178 mg via INTRAVENOUS
  Filled 2013-12-03: qty 30.98

## 2013-12-03 MED ORDER — PALONOSETRON HCL INJECTION 0.25 MG/5ML
INTRAVENOUS | Status: AC
  Filled 2013-12-03: qty 5

## 2013-12-03 MED ORDER — POTASSIUM CHLORIDE 2 MEQ/ML IV SOLN
Freq: Once | INTRAVENOUS | Status: AC
  Administered 2013-12-03: 10:00:00 via INTRAVENOUS
  Filled 2013-12-03: qty 10

## 2013-12-03 MED ORDER — PALONOSETRON HCL INJECTION 0.25 MG/5ML
0.2500 mg | Freq: Once | INTRAVENOUS | Status: AC
  Administered 2013-12-03: 0.25 mg via INTRAVENOUS

## 2013-12-03 MED ORDER — DEXAMETHASONE SODIUM PHOSPHATE 20 MG/5ML IJ SOLN
12.0000 mg | Freq: Once | INTRAMUSCULAR | Status: AC
  Administered 2013-12-03: 12 mg via INTRAVENOUS

## 2013-12-03 NOTE — Progress Notes (Signed)
11/08/13 notes--- NO PA REQUIRED REF #[id number]

## 2013-12-03 NOTE — Patient Instructions (Signed)
[facility] Discharge Instructions for Patients Receiving Chemotherapy  Today you received the following chemotherapy agents Cisplatin and Gemzar.  To help prevent nausea and vomiting after your treatment, we encourage you to take your nausea medication if needed.   If you develop nausea and vomiting that is not controlled by your nausea medication, call the clinic.   BELOW ARE SYMPTOMS THAT SHOULD BE REPORTED IMMEDIATELY:  *FEVER GREATER THAN 100.5 F  *CHILLS WITH OR WITHOUT FEVER  NAUSEA AND VOMITING THAT IS NOT CONTROLLED WITH YOUR NAUSEA MEDICATION  *UNUSUAL SHORTNESS OF BREATH  *UNUSUAL BRUISING OR BLEEDING  TENDERNESS IN MOUTH AND THROAT WITH OR WITHOUT PRESENCE OF ULCERS  *URINARY PROBLEMS  *BOWEL PROBLEMS  UNUSUAL RASH Items with * indicate a potential emergency and should be followed up as soon as possible.  Feel free to call the clinic you have any questions or concerns. The clinic phone number is [phone].  Have a great day!! It was my pleasure taking care of you today!!

## 2013-12-04 NOTE — Patient Instructions (Signed)
You are being scheduled for an MRI of your head to further evaluate her complaints of headache and decreased hearing and vision changes Continue with your labs and chemotherapy as scheduled Follow up in 3 weeks

## 2013-12-08 ENCOUNTER — Ambulatory Visit (HOSPITAL_COMMUNITY)
Admission: RE | Admit: 2013-12-08 | Discharge: 2013-12-08 | Disposition: A | Discharge status: Home / Self Care | Payer: 59 | Source: Ambulatory Visit | Attending: Physician Assistant | Admitting: Physician Assistant

## 2013-12-08 DIAGNOSIS — I6789 Other cerebrovascular disease: Secondary | ICD-10-CM | POA: Insufficient documentation

## 2013-12-08 DIAGNOSIS — R51 Headache: Secondary | ICD-10-CM | POA: Insufficient documentation

## 2013-12-08 DIAGNOSIS — H9190 Unspecified hearing loss, unspecified ear: Secondary | ICD-10-CM

## 2013-12-08 DIAGNOSIS — C3490 Malignant neoplasm of unspecified part of unspecified bronchus or lung: Secondary | ICD-10-CM | POA: Insufficient documentation

## 2013-12-08 DIAGNOSIS — H539 Unspecified visual disturbance: Secondary | ICD-10-CM

## 2013-12-08 DIAGNOSIS — R519 Headache, unspecified: Secondary | ICD-10-CM

## 2013-12-08 MED ORDER — GADOBENATE DIMEGLUMINE 529 MG/ML IV SOLN
9.0000 mL | Freq: Once | INTRAVENOUS | Status: AC | PRN
  Administered 2013-12-08: 9 mL via INTRAVENOUS

## 2013-12-09 ENCOUNTER — Other Ambulatory Visit: Payer: 59

## 2013-12-10 ENCOUNTER — Other Ambulatory Visit: Payer: Self-pay | Admitting: Physician Assistant

## 2013-12-10 ENCOUNTER — Other Ambulatory Visit (HOSPITAL_BASED_OUTPATIENT_CLINIC_OR_DEPARTMENT_OTHER): Payer: 59

## 2013-12-10 ENCOUNTER — Ambulatory Visit (HOSPITAL_BASED_OUTPATIENT_CLINIC_OR_DEPARTMENT_OTHER): Payer: 59

## 2013-12-10 VITALS — BP 129/65 | HR 73 | Temp 96.8°F | Resp 18

## 2013-12-10 DIAGNOSIS — C7970 Secondary malignant neoplasm of unspecified adrenal gland: Secondary | ICD-10-CM

## 2013-12-10 DIAGNOSIS — C3490 Malignant neoplasm of unspecified part of unspecified bronchus or lung: Secondary | ICD-10-CM

## 2013-12-10 DIAGNOSIS — C787 Secondary malignant neoplasm of liver and intrahepatic bile duct: Secondary | ICD-10-CM

## 2013-12-10 DIAGNOSIS — C3430 Malignant neoplasm of lower lobe, unspecified bronchus or lung: Secondary | ICD-10-CM

## 2013-12-10 DIAGNOSIS — R790 Abnormal level of blood mineral: Secondary | ICD-10-CM

## 2013-12-10 DIAGNOSIS — Z5111 Encounter for antineoplastic chemotherapy: Secondary | ICD-10-CM

## 2013-12-10 LAB — CBC WITH DIFFERENTIAL/PLATELET
BASO%: 0.6 % (ref 0.0–2.0)
Basophils Absolute: 0 10*3/uL (ref 0.0–0.1)
EOS ABS: 0 10*3/uL (ref 0.0–0.5)
EOS%: 0.2 % (ref 0.0–7.0)
HEMATOCRIT: 27.1 % — AB (ref 34.8–46.6)
HGB: 9.2 g/dL — ABNORMAL LOW (ref 11.6–15.9)
LYMPH#: 2.6 10*3/uL (ref 0.9–3.3)
LYMPH%: 47.1 % (ref 14.0–49.7)
MCH: 30.9 pg (ref 25.1–34.0)
MCHC: 33.9 g/dL (ref 31.5–36.0)
MCV: 90.9 fL (ref 79.5–101.0)
MONO#: 0.5 10*3/uL (ref 0.1–0.9)
MONO%: 9.9 % (ref 0.0–14.0)
NEUT%: 42.2 % (ref 38.4–76.8)
NEUTROS ABS: 2.3 10*3/uL (ref 1.5–6.5)
Platelets: 487 10*3/uL — ABNORMAL HIGH (ref 145–400)
RBC: 2.98 10*6/uL — ABNORMAL LOW (ref 3.70–5.45)
RDW: 16.5 % — ABNORMAL HIGH (ref 11.2–14.5)
WBC: 5.4 10*3/uL (ref 3.9–10.3)
nRBC: 0 % (ref 0–0)

## 2013-12-10 LAB — COMPREHENSIVE METABOLIC PANEL (CC13)
ALBUMIN: 3.3 g/dL — AB (ref 3.5–5.0)
ALT: 37 U/L (ref 0–55)
AST: 29 U/L (ref 5–34)
Alkaline Phosphatase: 156 U/L — ABNORMAL HIGH (ref 40–150)
Anion Gap: 10 mEq/L (ref 3–11)
BUN: 11.3 mg/dL (ref 7.0–26.0)
CHLORIDE: 100 meq/L (ref 98–109)
CO2: 23 meq/L (ref 22–29)
CREATININE: 0.6 mg/dL (ref 0.6–1.1)
Calcium: 9.5 mg/dL (ref 8.4–10.4)
Glucose: 79 mg/dl (ref 70–140)
POTASSIUM: 3.9 meq/L (ref 3.5–5.1)
Sodium: 133 mEq/L — ABNORMAL LOW (ref 136–145)
Total Bilirubin: 0.22 mg/dL (ref 0.20–1.20)
Total Protein: 7 g/dL (ref 6.4–8.3)

## 2013-12-10 LAB — MAGNESIUM (CC13): Magnesium: 1.4 mg/dl — CL (ref 1.5–2.5)

## 2013-12-10 MED ORDER — SODIUM CHLORIDE 0.9 % IV SOLN
Freq: Once | INTRAVENOUS | Status: AC
  Administered 2013-12-10: 15:00:00 via INTRAVENOUS

## 2013-12-10 MED ORDER — SODIUM CHLORIDE 0.9 % IV SOLN
800.0000 mg/m2 | Freq: Once | INTRAVENOUS | Status: AC
  Administered 2013-12-10: 1178 mg via INTRAVENOUS
  Filled 2013-12-10: qty 30.98

## 2013-12-10 MED ORDER — MAGNESIUM OXIDE 400 (241.3 MG) MG PO TABS: 400.0000 mg | ORAL_TABLET | Freq: Two times a day (BID) | ORAL | Status: DC

## 2013-12-10 MED ORDER — OXYCODONE HCL 5 MG PO TABS: 5.0000 mg | ORAL_TABLET | Freq: Four times a day (QID) | ORAL | Status: DC | PRN

## 2013-12-10 MED ORDER — PROCHLORPERAZINE MALEATE 10 MG PO TABS
10.0000 mg | ORAL_TABLET | Freq: Once | ORAL | Status: AC
  Administered 2013-12-10: 10 mg via ORAL

## 2013-12-10 MED ORDER — PROCHLORPERAZINE MALEATE 10 MG PO TABS
ORAL_TABLET | ORAL | Status: AC
  Filled 2013-12-10: qty 1

## 2013-12-10 NOTE — Progress Notes (Signed)
mag 1.4 today. Awilda Metro, PA is aware. No further orders at this time.

## 2013-12-10 NOTE — Patient Instructions (Signed)
[facility] Discharge Instructions for Patients Receiving Chemotherapy  Today you received the following chemotherapy agent: Gemzar   To help prevent nausea and vomiting after your treatment, we encourage you to take your nausea medication as prescribed.    If you develop nausea and vomiting that is not controlled by your nausea medication, call the clinic.   BELOW ARE SYMPTOMS THAT SHOULD BE REPORTED IMMEDIATELY:  *FEVER GREATER THAN 100.5 F  *CHILLS WITH OR WITHOUT FEVER  NAUSEA AND VOMITING THAT IS NOT CONTROLLED WITH YOUR NAUSEA MEDICATION  *UNUSUAL SHORTNESS OF BREATH  *UNUSUAL BRUISING OR BLEEDING  TENDERNESS IN MOUTH AND THROAT WITH OR WITHOUT PRESENCE OF ULCERS  *URINARY PROBLEMS  *BOWEL PROBLEMS  UNUSUAL RASH Items with * indicate a potential emergency and should be followed up as soon as possible.  Feel free to call the clinic you have any questions or concerns. The clinic phone number is [phone].

## 2013-12-16 ENCOUNTER — Other Ambulatory Visit: Payer: 59

## 2013-12-23 ENCOUNTER — Ambulatory Visit: Payer: 59

## 2013-12-23 ENCOUNTER — Other Ambulatory Visit: Payer: 59

## 2013-12-24 ENCOUNTER — Ambulatory Visit (HOSPITAL_BASED_OUTPATIENT_CLINIC_OR_DEPARTMENT_OTHER): Payer: 59

## 2013-12-24 ENCOUNTER — Telehealth: Payer: Self-pay | Admitting: *Deleted

## 2013-12-24 ENCOUNTER — Encounter: Payer: Self-pay | Admitting: Internal Medicine

## 2013-12-24 ENCOUNTER — Other Ambulatory Visit (HOSPITAL_BASED_OUTPATIENT_CLINIC_OR_DEPARTMENT_OTHER): Payer: 59

## 2013-12-24 ENCOUNTER — Telehealth: Payer: Self-pay | Admitting: Internal Medicine

## 2013-12-24 ENCOUNTER — Ambulatory Visit (HOSPITAL_BASED_OUTPATIENT_CLINIC_OR_DEPARTMENT_OTHER): Payer: 59 | Admitting: Internal Medicine

## 2013-12-24 VITALS — BP 133/65 | HR 81 | Temp 97.5°F | Resp 18 | Ht 62.0 in | Wt 107.7 lb

## 2013-12-24 DIAGNOSIS — C779 Secondary and unspecified malignant neoplasm of lymph node, unspecified: Secondary | ICD-10-CM

## 2013-12-24 DIAGNOSIS — C787 Secondary malignant neoplasm of liver and intrahepatic bile duct: Secondary | ICD-10-CM

## 2013-12-24 DIAGNOSIS — C3430 Malignant neoplasm of lower lobe, unspecified bronchus or lung: Secondary | ICD-10-CM

## 2013-12-24 DIAGNOSIS — C7970 Secondary malignant neoplasm of unspecified adrenal gland: Secondary | ICD-10-CM

## 2013-12-24 DIAGNOSIS — C50919 Malignant neoplasm of unspecified site of unspecified female breast: Secondary | ICD-10-CM

## 2013-12-24 DIAGNOSIS — C3490 Malignant neoplasm of unspecified part of unspecified bronchus or lung: Secondary | ICD-10-CM

## 2013-12-24 DIAGNOSIS — Z5111 Encounter for antineoplastic chemotherapy: Secondary | ICD-10-CM

## 2013-12-24 LAB — COMPREHENSIVE METABOLIC PANEL (CC13)
ALBUMIN: 3.2 g/dL — AB (ref 3.5–5.0)
ALT: 10 U/L (ref 0–55)
AST: 18 U/L (ref 5–34)
Alkaline Phosphatase: 125 U/L (ref 40–150)
Anion Gap: 11 mEq/L (ref 3–11)
BILIRUBIN TOTAL: 0.22 mg/dL (ref 0.20–1.20)
BUN: 8.8 mg/dL (ref 7.0–26.0)
CO2: 24 mEq/L (ref 22–29)
Calcium: 9.8 mg/dL (ref 8.4–10.4)
Chloride: 104 mEq/L (ref 98–109)
Creatinine: 0.7 mg/dL (ref 0.6–1.1)
Glucose: 115 mg/dl (ref 70–140)
POTASSIUM: 3.8 meq/L (ref 3.5–5.1)
SODIUM: 139 meq/L (ref 136–145)
TOTAL PROTEIN: 6.6 g/dL (ref 6.4–8.3)

## 2013-12-24 LAB — CBC WITH DIFFERENTIAL/PLATELET
BASO%: 0.6 % (ref 0.0–2.0)
Basophils Absolute: 0 10*3/uL (ref 0.0–0.1)
EOS%: 0.8 % (ref 0.0–7.0)
Eosinophils Absolute: 0 10*3/uL (ref 0.0–0.5)
HCT: 27.1 % — ABNORMAL LOW (ref 34.8–46.6)
HGB: 9.3 g/dL — ABNORMAL LOW (ref 11.6–15.9)
LYMPH%: 36.4 % (ref 14.0–49.7)
MCH: 33.2 pg (ref 25.1–34.0)
MCHC: 34.2 g/dL (ref 31.5–36.0)
MCV: 97.3 fL (ref 79.5–101.0)
MONO#: 0.6 10*3/uL (ref 0.1–0.9)
MONO%: 15.2 % — AB (ref 0.0–14.0)
NEUT%: 47 % (ref 38.4–76.8)
NEUTROS ABS: 2 10*3/uL (ref 1.5–6.5)
PLATELETS: 299 10*3/uL (ref 145–400)
RBC: 2.78 10*6/uL — ABNORMAL LOW (ref 3.70–5.45)
RDW: 20.1 % — AB (ref 11.2–14.5)
WBC: 4.2 10*3/uL (ref 3.9–10.3)
lymph#: 1.5 10*3/uL (ref 0.9–3.3)

## 2013-12-24 LAB — TECHNOLOGIST REVIEW

## 2013-12-24 LAB — MAGNESIUM (CC13): Magnesium: 1.7 mg/dl (ref 1.5–2.5)

## 2013-12-24 MED ORDER — SODIUM CHLORIDE 0.9 % IV SOLN
150.0000 mg | Freq: Once | INTRAVENOUS | Status: AC
  Administered 2013-12-24: 150 mg via INTRAVENOUS
  Filled 2013-12-24: qty 5

## 2013-12-24 MED ORDER — SODIUM CHLORIDE 0.9 % IV SOLN
Freq: Once | INTRAVENOUS | Status: AC
  Administered 2013-12-24: 10:00:00 via INTRAVENOUS

## 2013-12-24 MED ORDER — DEXAMETHASONE SODIUM PHOSPHATE 20 MG/5ML IJ SOLN
INTRAMUSCULAR | Status: AC
  Filled 2013-12-24: qty 5

## 2013-12-24 MED ORDER — PALONOSETRON HCL INJECTION 0.25 MG/5ML
INTRAVENOUS | Status: AC
  Filled 2013-12-24: qty 5

## 2013-12-24 MED ORDER — SODIUM CHLORIDE 0.9 % IV SOLN
75.0000 mg/m2 | Freq: Once | INTRAVENOUS | Status: AC
  Administered 2013-12-24: 110 mg via INTRAVENOUS
  Filled 2013-12-24: qty 110

## 2013-12-24 MED ORDER — PALONOSETRON HCL INJECTION 0.25 MG/5ML
0.2500 mg | Freq: Once | INTRAVENOUS | Status: AC
  Administered 2013-12-24: 0.25 mg via INTRAVENOUS

## 2013-12-24 MED ORDER — SODIUM CHLORIDE 0.9 % IV SOLN
800.0000 mg/m2 | Freq: Once | INTRAVENOUS | Status: AC
  Administered 2013-12-24: 1178 mg via INTRAVENOUS
  Filled 2013-12-24: qty 30.98

## 2013-12-24 MED ORDER — POTASSIUM CHLORIDE 2 MEQ/ML IV SOLN
Freq: Once | INTRAVENOUS | Status: AC
  Administered 2013-12-24: 10:00:00 via INTRAVENOUS
  Filled 2013-12-24: qty 10

## 2013-12-24 MED ORDER — DEXAMETHASONE SODIUM PHOSPHATE 20 MG/5ML IJ SOLN
12.0000 mg | Freq: Once | INTRAMUSCULAR | Status: AC
  Administered 2013-12-24: 12 mg via INTRAVENOUS

## 2013-12-24 NOTE — Patient Instructions (Signed)
Smoking Cessation, Tips for Success If you are ready to quit smoking, congratulations! You have chosen to help yourself be healthier. Cigarettes bring nicotine, tar, carbon monoxide, and other irritants into your body. Your lungs, heart, and blood vessels will be able to work better without these poisons. There are many different ways to quit smoking. Nicotine gum, nicotine patches, a nicotine inhaler, or nicotine nasal spray can help with physical craving. Hypnosis, support groups, and medicines help break the habit of smoking. WHAT THINGS CAN I DO TO MAKE QUITTING EASIER?  Here are some tips to help you quit for good:  Pick a date when you will quit smoking completely. Tell all of your friends and family about your plan to quit on that date.  Do not try to slowly cut down on the number of cigarettes you are smoking. Pick a quit date and quit smoking completely starting on that day.  Throw away all cigarettes.   Clean and remove all ashtrays from your home, work, and car.   On a card, write down your reasons for quitting. Carry the card with you and read it when you get the urge to smoke.   Cleanse your body of nicotine. Drink enough water and fluids to keep your urine clear or pale yellow. Do this after quitting to flush the nicotine from your body.   Learn to predict your moods. Do not let a bad situation be your excuse to have a cigarette. Some situations in your life might tempt you into wanting a cigarette.   Never have "just one" cigarette. It leads to wanting another and another. Remind yourself of your decision to quit.   Change habits associated with smoking. If you smoked while driving or when feeling stressed, try other activities to replace smoking. Stand up when drinking your coffee. Brush your teeth after eating. Sit in a different chair when you read the paper. Avoid alcohol while trying to quit, and try to drink fewer caffeinated beverages. Alcohol and caffeine may urge  you to smoke.   Avoid foods and drinks that can trigger a desire to smoke, such as sugary or spicy foods and alcohol.   Ask people who smoke not to smoke around you.   Have something planned to do right after eating or having a cup of coffee. For example, plan to take a walk or exercise.   Try a relaxation exercise to calm you down and decrease your stress. Remember, you may be tense and nervous for the first 2 weeks after you quit, but this will pass.   Find new activities to keep your hands busy. Play with a pen, coin, or rubber band. Doodle or draw things on paper.   Brush your teeth right after eating. This will help cut down on the craving for the taste of tobacco after meals. You can also try mouthwash.   Use oral substitutes in place of cigarettes. Try using lemon drops, carrots, cinnamon sticks, or chewing gum. Keep them handy so they are available when you have the urge to smoke.   When you have the urge to smoke, try deep breathing.   Designate your home as a nonsmoking area.   If you are a heavy smoker, ask your health care provider about a prescription for nicotine chewing gum. It can ease your withdrawal from nicotine.   Reward yourself. Set aside the cigarette money you save and buy yourself something nice.   Look for support from others. Join a support group or   smoking cessation program. Ask someone at home or at work to help you with your plan to quit smoking.   Always ask yourself, "Do I need this cigarette or is this just a reflex?" Tell yourself, "Today, I choose not to smoke," or "I do not want to smoke." You are reminding yourself of your decision to quit.  Do not replace cigarette smoking with electronic cigarettes (commonly called e-cigarettes). The safety of e-cigarettes is unknown, and some may contain harmful chemicals.  If you relapse, do not give up! Plan ahead and think about what you will do the next time you get the urge to smoke.  HOW WILL  I FEEL WHEN I QUIT SMOKING? You may have symptoms of withdrawal because your body is used to nicotine (the addictive substance in cigarettes). You may crave cigarettes, be irritable, feel very hungry, cough often, get headaches, or have difficulty concentrating. The withdrawal symptoms are only temporary. They are strongest when you first quit but will go away within 10 14 days. When withdrawal symptoms occur, stay in control. Think about your reasons for quitting. Remind yourself that these are signs that your body is healing and getting used to being without cigarettes. Remember that withdrawal symptoms are easier to treat than the major diseases that smoking can cause.  Even after the withdrawal is over, expect periodic urges to smoke. However, these cravings are generally short lived and will go away whether you smoke or not. Do not smoke!  WHAT RESOURCES ARE AVAILABLE TO HELP ME QUIT SMOKING? Your health care provider can direct you to community resources or hospitals for support, which may include:  Group support.  Education.  Hypnosis.  Therapy. Document Released: 05/11/2004 Document Revised: 06/03/2013 Document Reviewed: 01/29/2013 [employer] Patient Information 2014 [employer].  

## 2013-12-24 NOTE — Patient Instructions (Signed)
Cisplatin injection What is this medicine? CISPLATIN (SIS pla tin) is a chemotherapy drug. It targets fast dividing cells, like cancer cells, and causes these cells to die. This medicine is used to treat many types of cancer like bladder, ovarian, and testicular cancers. This medicine may be used for other purposes; ask your health care provider or pharmacist if you have questions. COMMON BRAND NAME(S): Platinol -AQ, Platinol What should I tell my health care provider before I take this medicine? They need to know if you have any of these conditions: -blood disorders -hearing problems -kidney disease -recent or ongoing radiation therapy -an unusual or allergic reaction to cisplatin, carboplatin, other chemotherapy, other medicines, foods, dyes, or preservatives -pregnant or trying to get pregnant -breast-feeding How should I use this medicine? This drug is given as an infusion into a vein. It is administered in a hospital or clinic by a specially trained health care professional. Talk to your pediatrician regarding the use of this medicine in children. Special care may be needed. Overdosage: If you think you have taken too much of this medicine contact a poison control center or emergency room at once. NOTE: This medicine is only for you. Do not share this medicine with others. What if I miss a dose? It is important not to miss a dose. Call your doctor or health care professional if you are unable to keep an appointment. What may interact with this medicine? -dofetilide -foscarnet -medicines for seizures -medicines to increase blood counts like filgrastim, pegfilgrastim, sargramostim -probenecid -pyridoxine used with altretamine -rituximab -some antibiotics like amikacin, gentamicin, neomycin, polymyxin B, streptomycin, tobramycin -sulfinpyrazone -vaccines -zalcitabine Talk to your doctor or health care professional before taking any of these  medicines: -acetaminophen -aspirin -ibuprofen -ketoprofen -naproxen This list may not describe all possible interactions. Give your health care provider a list of all the medicines, herbs, non-prescription drugs, or dietary supplements you use. Also tell them if you smoke, drink alcohol, or use illegal drugs. Some items may interact with your medicine. What should I watch for while using this medicine? Your condition will be monitored carefully while you are receiving this medicine. You will need important blood work done while you are taking this medicine. This drug may make you feel generally unwell. This is not uncommon, as chemotherapy can affect healthy cells as well as cancer cells. Report any side effects. Continue your course of treatment even though you feel ill unless your doctor tells you to stop. In some cases, you may be given additional medicines to help with side effects. Follow all directions for their use. Call your doctor or health care professional for advice if you get a fever, chills or sore throat, or other symptoms of a cold or flu. Do not treat yourself. This drug decreases your body's ability to fight infections. Try to avoid being around people who are sick. This medicine may increase your risk to bruise or bleed. Call your doctor or health care professional if you notice any unusual bleeding. Be careful brushing and flossing your teeth or using a toothpick because you may get an infection or bleed more easily. If you have any dental work done, tell your dentist you are receiving this medicine. Avoid taking products that contain aspirin, acetaminophen, ibuprofen, naproxen, or ketoprofen unless instructed by your doctor. These medicines may hide a fever. Do not become pregnant while taking this medicine. Women should inform their doctor if they wish to become pregnant or think they might be pregnant. There is a  potential for serious side effects to an unborn child. Talk to  your health care professional or pharmacist for more information. Do not breast-feed an infant while taking this medicine. Drink fluids as directed while you are taking this medicine. This will help protect your kidneys. Call your doctor or health care professional if you get diarrhea. Do not treat yourself. What side effects may I notice from receiving this medicine? Side effects that you should report to your doctor or health care professional as soon as possible: -allergic reactions like skin rash, itching or hives, swelling of the face, lips, or tongue -signs of infection - fever or chills, cough, sore throat, pain or difficulty passing urine -signs of decreased platelets or bleeding - bruising, pinpoint red spots on the skin, black, tarry stools, nosebleeds -signs of decreased red blood cells - unusually weak or tired, fainting spells, lightheadedness -breathing problems -changes in hearing -gout pain -low blood counts - This drug may decrease the number of white blood cells, red blood cells and platelets. You may be at increased risk for infections and bleeding. -nausea and vomiting -pain, swelling, redness or irritation at the injection site -pain, tingling, numbness in the hands or feet -problems with balance, movement -trouble passing urine or change in the amount of urine Side effects that usually do not require medical attention (report to your doctor or health care professional if they continue or are bothersome): -changes in vision -loss of appetite -metallic taste in the mouth or changes in taste This list may not describe all possible side effects. Call your doctor for medical advice about side effects. You may report side effects to FDA at 1-800-FDA-1088. Where should I keep my medicine? This drug is given in a hospital or clinic and will not be stored at home. NOTE: This sheet is a summary. It may not cover all possible information. If you have questions about this medicine,  talk to your doctor, pharmacist, or health care provider.  2014, Elsevier/Gold Standard. (2007-11-18 14:40:54) Gemcitabine injection What is this medicine? GEMCITABINE (jem SIT a been) is a chemotherapy drug. This medicine is used to treat many types of cancer like breast cancer, lung cancer, pancreatic cancer, and ovarian cancer. This medicine may be used for other purposes; ask your health care provider or pharmacist if you have questions. COMMON BRAND NAME(S): Gemzar What should I tell my health care provider before I take this medicine? They need to know if you have any of these conditions: -blood disorders -infection -kidney disease -liver disease -recent or ongoing radiation therapy -an unusual or allergic reaction to gemcitabine, other chemotherapy, other medicines, foods, dyes, or preservatives -pregnant or trying to get pregnant -breast-feeding How should I use this medicine? This drug is given as an infusion into a vein. It is administered in a hospital or clinic by a specially trained health care professional. Talk to your pediatrician regarding the use of this medicine in children. Special care may be needed. Overdosage: If you think you have taken too much of this medicine contact a poison control center or emergency room at once. NOTE: This medicine is only for you. Do not share this medicine with others. What if I miss a dose? It is important not to miss your dose. Call your doctor or health care professional if you are unable to keep an appointment. What may interact with this medicine? -medicines to increase blood counts like filgrastim, pegfilgrastim, sargramostim -some other chemotherapy drugs like cisplatin -vaccines Talk to your doctor or health  care professional before taking any of these medicines: -acetaminophen -aspirin -ibuprofen -ketoprofen -naproxen This list may not describe all possible interactions. Give your health care provider a list of all the  medicines, herbs, non-prescription drugs, or dietary supplements you use. Also tell them if you smoke, drink alcohol, or use illegal drugs. Some items may interact with your medicine. What should I watch for while using this medicine? Visit your doctor for checks on your progress. This drug may make you feel generally unwell. This is not uncommon, as chemotherapy can affect healthy cells as well as cancer cells. Report any side effects. Continue your course of treatment even though you feel ill unless your doctor tells you to stop. In some cases, you may be given additional medicines to help with side effects. Follow all directions for their use. Call your doctor or health care professional for advice if you get a fever, chills or sore throat, or other symptoms of a cold or flu. Do not treat yourself. This drug decreases your body's ability to fight infections. Try to avoid being around people who are sick. This medicine may increase your risk to bruise or bleed. Call your doctor or health care professional if you notice any unusual bleeding. Be careful brushing and flossing your teeth or using a toothpick because you may get an infection or bleed more easily. If you have any dental work done, tell your dentist you are receiving this medicine. Avoid taking products that contain aspirin, acetaminophen, ibuprofen, naproxen, or ketoprofen unless instructed by your doctor. These medicines may hide a fever. Women should inform their doctor if they wish to become pregnant or think they might be pregnant. There is a potential for serious side effects to an unborn child. Talk to your health care professional or pharmacist for more information. Do not breast-feed an infant while taking this medicine. What side effects may I notice from receiving this medicine? Side effects that you should report to your doctor or health care professional as soon as possible: -allergic reactions like skin rash, itching or hives,  swelling of the face, lips, or tongue -low blood counts - this medicine may decrease the number of white blood cells, red blood cells and platelets. You may be at increased risk for infections and bleeding. -signs of infection - fever or chills, cough, sore throat, pain or difficulty passing urine -signs of decreased platelets or bleeding - bruising, pinpoint red spots on the skin, black, tarry stools, blood in the urine -signs of decreased red blood cells - unusually weak or tired, fainting spells, lightheadedness -breathing problems -chest pain -mouth sores -nausea and vomiting -pain, swelling, redness at site where injected -pain, tingling, numbness in the hands or feet -stomach pain -swelling of ankles, feet, hands -unusual bleeding Side effects that usually do not require medical attention (report to your doctor or health care professional if they continue or are bothersome): -constipation -diarrhea -hair loss -loss of appetite -stomach upset This list may not describe all possible side effects. Call your doctor for medical advice about side effects. You may report side effects to FDA at 1-800-FDA-1088. Where should I keep my medicine? This drug is given in a hospital or clinic and will not be stored at home. NOTE: This sheet is a summary. It may not cover all possible information. If you have questions about this medicine, talk to your doctor, pharmacist, or health care provider.  2014, Elsevier/Gold Standard. (2007-12-23 18:45:54)

## 2013-12-24 NOTE — Telephone Encounter (Signed)
Gave pt appt for lab ,MD and chemo for MAy , also gave pt oral contrast for  CT

## 2013-12-24 NOTE — Progress Notes (Signed)
[facility] [phone]   [phone]  OFFICE PROGRESS NOTE  Mathews Argyle, MD [address] [place] [address]  DIAGNOSIS: Stage IV (T1b, N2, M1b) non-small cell lung cancer, adenocarcinoma, presented with a right lower lobe lung mass as well as mediastinal, hilar, bilateral supraclavicular lymphadenopathy in addition to extensive liver metastases and retroperitoneal lymphadenopathy diagnosed in December of 2014.  MOLECULAR BIOMARKERS: [facility] ONE biomarker testing revealed TP53 E204*, KEAP1 R655f*25+, NFKBIA amplification, NKX2-1 amplification, SMARCA4 E463*. The patient was negative for EGFR and ALK gene translocation   PRIOR THERAPY: Systemic chemotherapy with carboplatin for AUC of 5 and Alimta 500 mg/M2 every 3 weeks, status post 3 cycles. First dose 09/02/2013.   CURRENT THERAPY: Systemic chemotherapy with cisplatin 100 mg/M2 on days 1 and gemcitabine 800 mg/M2 on days 1 and 8 every 3 weeks, first cycle on 11/12/2013. Status post 2 cycles.   CHEMOTHERAPY INTENT: palliative  CURRENT # OF CHEMOTHERAPY CYCLES: 3  CURRENT ANTIEMETICS: Zofran, dexamethasone and Compazine.  CURRENT SMOKING STATUS: former smoker  ORAL CHEMOTHERAPY AND CONSENT: None  CURRENT BISPHOSPHONATES USE: None  PAIN MANAGEMENT: 0/10  NARCOTICS INDUCED CONSTIPATION: None  LIVING WILL AND CODE STATUS: Full Code  INTERVAL HISTORY: Amber ADRIANCE89y.o. female returns to the clinic today for follow up visit accompanied by her husband. The patient is feeling fine today with no specific complaints except for fatigue.She tolerated the second cycle of her treatment much better than the first cycle of the treatment with cisplatin and gemcitabine. She has improvement in the ringings of her ears. The patient denied having any significant nausea or vomiting. She denied having any fever or chills. She has no significant weight loss or night sweats. She  denied having any significant chest pain, shortness of breath, cough or hemoptysis.   MEDICAL HISTORY: Past Medical History  Diagnosis Date  . HTN (hypertension)     ALLERGIES:  is allergic to hctz and plendil.  MEDICATIONS:  Current Outpatient Prescriptions  Medication Sig Dispense Refill  . amLODipine (NORVASC) 10 MG tablet Take 10 mg by mouth daily.      .Marland Kitchenaspirin EC 81 MG tablet Take 81 mg by mouth daily.      . calcium carbonate (TUMS - DOSED IN MG ELEMENTAL CALCIUM) 500 MG chewable tablet Chew 1 tablet by mouth daily as needed for indigestion or heartburn.      . folic acid (FOLVITE) 1 MG tablet Take 1 tablet (1 mg total) by mouth daily.  30 tablet  4  . lisinopril (PRINIVIL,ZESTRIL) 20 MG tablet Take 20 mg by mouth daily.      . magnesium oxide (MAG-OX) 400 (241.3 MG) MG tablet Take 1 tablet (400 mg total) by mouth 2 (two) times daily.  60 tablet  1  . nebivolol (BYSTOLIC) 10 MG tablet Take 10 mg by mouth daily.      .Marland KitchenoxyCODONE (ROXICODONE) 5 MG immediate release tablet Take 1 tablet (5 mg total) by mouth every 6 (six) hours as needed for severe pain.  60 tablet  0  . prochlorperazine (COMPAZINE) 10 MG tablet Take 1 tablet (10 mg total) by mouth every 6 (six) hours as needed for nausea or vomiting.  60 tablet  1   No current facility-administered medications for this visit.    REVIEW OF SYSTEMS:  Constitutional: negative Eyes: negative Ears, nose, mouth, throat, and face: negative Respiratory: negative Cardiovascular: negative Gastrointestinal: positive for abdominal pain Genitourinary:negative Integument/breast:  negative Hematologic/lymphatic: negative Musculoskeletal:negative Neurological: negative Behavioral/Psych: negative Endocrine: negative Allergic/Immunologic: negative   PHYSICAL EXAMINATION: General appearance: alert, cooperative and no distress Head: Normocephalic, without obvious abnormality, atraumatic Neck: no adenopathy, no JVD, supple, symmetrical,  trachea midline and thyroid not enlarged, symmetric, no tenderness/mass/nodules Lymph nodes: Cervical, supraclavicular, and axillary nodes normal. Resp: clear to auscultation bilaterally Back: symmetric, no curvature. ROM normal. No CVA tenderness. Cardio: regular rate and rhythm, S1, S2 normal, no murmur, click, rub or gallop GI: soft, non-tender; bowel sounds normal; no masses,  no organomegaly Extremities: extremities normal, atraumatic, no cyanosis or edema Neurologic: Alert and oriented X 3, normal strength and tone. Normal symmetric reflexes. Normal coordination and gait  ECOG PERFORMANCE STATUS: 1 - Symptomatic but completely ambulatory  Blood pressure 133/65, pulse 81, temperature 97.5 F (36.4 C), temperature source Oral, resp. rate 18, height 5' 2"  (1.575 m), weight 107 lb 11.2 oz (48.852 kg), SpO2 100.00%.  LABORATORY DATA: Lab Results  Component Value Date   WBC 4.2 12/24/2013   HGB 9.3* 12/24/2013   HCT 27.1* 12/24/2013   MCV 97.3 12/24/2013   PLT 299 12/24/2013      Chemistry      Component Value Date/Time   NA 133* 12/10/2013 1334   K 3.9 12/10/2013 1334   CO2 23 12/10/2013 1334   BUN 11.3 12/10/2013 1334   CREATININE 0.6 12/10/2013 1334   CREATININE 0.80 08/21/2013 0800      Component Value Date/Time   CALCIUM 9.5 12/10/2013 1334       RADIOGRAPHIC STUDIES:  ASSESSMENT AND PLAN: This is a very pleasant 63 years old white female recently diagnosed with metastatic non-small cell lung cancer, adenocarcinoma presented with right lower lobe lung mass in addition to mediastinal and supraclavicular lymphadenopathy as well as diffuse metastatic liver lesions and retroperitoneal lymphadenopathy. She underwent 3 cycles of systemic chemotherapy with carboplatin and Alimta but unfortunately has evidence for disease progression especially in the liver and new small left adrenal metastasis. She is currently undergoing second line chemotherapy with cisplatin and gemcitabine status  post 2 cycles and tolerating it fairly well. I recommended for her to proceed with cycle #3 today as scheduled, but I would reduce the dose of cisplatin to 75 mg/M2 because of the hearing issues. She would come back for followup visit in 3 weeks with repeat CT scan of the chest, abdomen and pelvis for restaging of her disease. She was advised to call immediately if she has any concerning symptoms in the interval. The patient voices understanding of current disease status and treatment options and is in agreement with the current care plan.  All questions were answered. The patient knows to call the clinic with any problems, questions or concerns. We can certainly see the patient much sooner if necessary.  Disclaimer: This note was dictated with voice recognition software. Similar sounding words can inadvertently be transcribed and may not be corrected upon review.

## 2013-12-24 NOTE — Telephone Encounter (Signed)
Per staff message and POF I have scheduled appts.  JMW  

## 2013-12-31 ENCOUNTER — Ambulatory Visit (HOSPITAL_BASED_OUTPATIENT_CLINIC_OR_DEPARTMENT_OTHER): Payer: 59

## 2013-12-31 ENCOUNTER — Other Ambulatory Visit (HOSPITAL_BASED_OUTPATIENT_CLINIC_OR_DEPARTMENT_OTHER): Payer: 59

## 2013-12-31 VITALS — BP 136/78 | HR 85 | Temp 97.8°F | Resp 18

## 2013-12-31 DIAGNOSIS — C7970 Secondary malignant neoplasm of unspecified adrenal gland: Secondary | ICD-10-CM

## 2013-12-31 DIAGNOSIS — Z5111 Encounter for antineoplastic chemotherapy: Secondary | ICD-10-CM

## 2013-12-31 DIAGNOSIS — C787 Secondary malignant neoplasm of liver and intrahepatic bile duct: Secondary | ICD-10-CM

## 2013-12-31 DIAGNOSIS — C3430 Malignant neoplasm of lower lobe, unspecified bronchus or lung: Secondary | ICD-10-CM

## 2013-12-31 DIAGNOSIS — C3490 Malignant neoplasm of unspecified part of unspecified bronchus or lung: Secondary | ICD-10-CM

## 2013-12-31 DIAGNOSIS — C779 Secondary and unspecified malignant neoplasm of lymph node, unspecified: Secondary | ICD-10-CM

## 2013-12-31 DIAGNOSIS — C50919 Malignant neoplasm of unspecified site of unspecified female breast: Secondary | ICD-10-CM

## 2013-12-31 LAB — CBC WITH DIFFERENTIAL/PLATELET
BASO%: 0.7 % (ref 0.0–2.0)
BASOS ABS: 0 10*3/uL (ref 0.0–0.1)
EOS%: 0.3 % (ref 0.0–7.0)
Eosinophils Absolute: 0 10*3/uL (ref 0.0–0.5)
HEMATOCRIT: 26.5 % — AB (ref 34.8–46.6)
HEMOGLOBIN: 9.2 g/dL — AB (ref 11.6–15.9)
LYMPH%: 37.5 % (ref 14.0–49.7)
MCH: 33.6 pg (ref 25.1–34.0)
MCHC: 34.6 g/dL (ref 31.5–36.0)
MCV: 97.3 fL (ref 79.5–101.0)
MONO#: 0.4 10*3/uL (ref 0.1–0.9)
MONO%: 7.5 % (ref 0.0–14.0)
NEUT%: 54 % (ref 38.4–76.8)
NEUTROS ABS: 3.2 10*3/uL (ref 1.5–6.5)
Platelets: 380 10*3/uL (ref 145–400)
RBC: 2.72 10*6/uL — ABNORMAL LOW (ref 3.70–5.45)
RDW: 19.2 % — ABNORMAL HIGH (ref 11.2–14.5)
WBC: 5.8 10*3/uL (ref 3.9–10.3)
lymph#: 2.2 10*3/uL (ref 0.9–3.3)

## 2013-12-31 LAB — COMPREHENSIVE METABOLIC PANEL (CC13)
ALK PHOS: 128 U/L (ref 40–150)
ALT: 19 U/L (ref 0–55)
AST: 20 U/L (ref 5–34)
Albumin: 3.4 g/dL — ABNORMAL LOW (ref 3.5–5.0)
Anion Gap: 11 mEq/L (ref 3–11)
BUN: 12.3 mg/dL (ref 7.0–26.0)
CALCIUM: 9.9 mg/dL (ref 8.4–10.4)
CO2: 26 mEq/L (ref 22–29)
Chloride: 102 mEq/L (ref 98–109)
Creatinine: 0.8 mg/dL (ref 0.6–1.1)
Glucose: 112 mg/dl (ref 70–140)
Potassium: 3.9 mEq/L (ref 3.5–5.1)
Sodium: 139 mEq/L (ref 136–145)
Total Bilirubin: <0.2 mg/dL (ref 0.20–1.20)
Total Protein: 6.8 g/dL (ref 6.4–8.3)

## 2013-12-31 LAB — TECHNOLOGIST REVIEW

## 2013-12-31 LAB — MAGNESIUM (CC13): MAGNESIUM: 1.8 mg/dL (ref 1.5–2.5)

## 2013-12-31 MED ORDER — SODIUM CHLORIDE 0.9 % IV SOLN
Freq: Once | INTRAVENOUS | Status: AC
  Administered 2013-12-31: 14:00:00 via INTRAVENOUS

## 2013-12-31 MED ORDER — PROCHLORPERAZINE MALEATE 10 MG PO TABS
10.0000 mg | ORAL_TABLET | Freq: Once | ORAL | Status: AC
  Administered 2013-12-31: 10 mg via ORAL

## 2013-12-31 MED ORDER — SODIUM CHLORIDE 0.9 % IV SOLN
800.0000 mg/m2 | Freq: Once | INTRAVENOUS | Status: AC
  Administered 2013-12-31: 1178 mg via INTRAVENOUS
  Filled 2013-12-31: qty 30.98

## 2013-12-31 MED ORDER — PROCHLORPERAZINE MALEATE 10 MG PO TABS
ORAL_TABLET | ORAL | Status: AC
Start: 2013-12-31 — End: 2013-12-31
  Filled 2013-12-31: qty 1

## 2013-12-31 NOTE — Patient Instructions (Signed)
[facility] Discharge Instructions for Patients Receiving Chemotherapy  Today you received the following chemotherapy agents: Gemzar  To help prevent nausea and vomiting after your treatment, we encourage you to take your nausea medication as prescribed.   If you develop nausea and vomiting that is not controlled by your nausea medication, call the clinic.   BELOW ARE SYMPTOMS THAT SHOULD BE REPORTED IMMEDIATELY:  *FEVER GREATER THAN 100.5 F  *CHILLS WITH OR WITHOUT FEVER  NAUSEA AND VOMITING THAT IS NOT CONTROLLED WITH YOUR NAUSEA MEDICATION  *UNUSUAL SHORTNESS OF BREATH  *UNUSUAL BRUISING OR BLEEDING  TENDERNESS IN MOUTH AND THROAT WITH OR WITHOUT PRESENCE OF ULCERS  *URINARY PROBLEMS  *BOWEL PROBLEMS  UNUSUAL RASH Items with * indicate a potential emergency and should be followed up as soon as possible.  Feel free to call the clinic you have any questions or concerns. The clinic phone number is [phone].

## 2014-01-12 ENCOUNTER — Ambulatory Visit (HOSPITAL_COMMUNITY)
Admission: RE | Admit: 2014-01-12 | Discharge: 2014-01-12 | Disposition: A | Discharge status: Home / Self Care | Payer: 59 | Source: Ambulatory Visit | Attending: Internal Medicine | Admitting: Internal Medicine

## 2014-01-12 ENCOUNTER — Encounter (HOSPITAL_COMMUNITY): Payer: Self-pay

## 2014-01-12 DIAGNOSIS — J438 Other emphysema: Secondary | ICD-10-CM | POA: Insufficient documentation

## 2014-01-12 DIAGNOSIS — M51379 Other intervertebral disc degeneration, lumbosacral region without mention of lumbar back pain or lower extremity pain: Secondary | ICD-10-CM | POA: Insufficient documentation

## 2014-01-12 DIAGNOSIS — D259 Leiomyoma of uterus, unspecified: Secondary | ICD-10-CM | POA: Insufficient documentation

## 2014-01-12 DIAGNOSIS — C3490 Malignant neoplasm of unspecified part of unspecified bronchus or lung: Secondary | ICD-10-CM | POA: Insufficient documentation

## 2014-01-12 DIAGNOSIS — J90 Pleural effusion, not elsewhere classified: Secondary | ICD-10-CM | POA: Insufficient documentation

## 2014-01-12 DIAGNOSIS — C787 Secondary malignant neoplasm of liver and intrahepatic bile duct: Secondary | ICD-10-CM | POA: Insufficient documentation

## 2014-01-12 DIAGNOSIS — C7970 Secondary malignant neoplasm of unspecified adrenal gland: Secondary | ICD-10-CM | POA: Insufficient documentation

## 2014-01-12 DIAGNOSIS — M5137 Other intervertebral disc degeneration, lumbosacral region: Secondary | ICD-10-CM | POA: Insufficient documentation

## 2014-01-12 HISTORY — DX: Malignant (primary) neoplasm, unspecified: C80.1

## 2014-01-12 MED ORDER — IOHEXOL 300 MG/ML  SOLN
100.0000 mL | Freq: Once | INTRAMUSCULAR | Status: AC | PRN
  Administered 2014-01-12: 100 mL via INTRAVENOUS

## 2014-01-13 ENCOUNTER — Encounter: Payer: Self-pay | Admitting: Physician Assistant

## 2014-01-13 ENCOUNTER — Ambulatory Visit (HOSPITAL_BASED_OUTPATIENT_CLINIC_OR_DEPARTMENT_OTHER): Payer: 59 | Admitting: Physician Assistant

## 2014-01-13 ENCOUNTER — Ambulatory Visit: Payer: 59 | Admitting: Physician Assistant

## 2014-01-13 ENCOUNTER — Ambulatory Visit (HOSPITAL_COMMUNITY)
Admission: RE | Admit: 2014-01-13 | Discharge: 2014-01-13 | Disposition: A | Discharge status: Home / Self Care | Payer: 59 | Source: Ambulatory Visit | Attending: Internal Medicine | Admitting: Internal Medicine

## 2014-01-13 ENCOUNTER — Other Ambulatory Visit (HOSPITAL_BASED_OUTPATIENT_CLINIC_OR_DEPARTMENT_OTHER): Payer: 59

## 2014-01-13 VITALS — BP 124/74 | HR 77 | Temp 97.3°F | Resp 18 | Ht 62.0 in | Wt 110.0 lb

## 2014-01-13 DIAGNOSIS — R600 Localized edema: Secondary | ICD-10-CM

## 2014-01-13 DIAGNOSIS — C787 Secondary malignant neoplasm of liver and intrahepatic bile duct: Secondary | ICD-10-CM

## 2014-01-13 DIAGNOSIS — C3490 Malignant neoplasm of unspecified part of unspecified bronchus or lung: Secondary | ICD-10-CM

## 2014-01-13 DIAGNOSIS — C3430 Malignant neoplasm of lower lobe, unspecified bronchus or lung: Secondary | ICD-10-CM

## 2014-01-13 DIAGNOSIS — C7970 Secondary malignant neoplasm of unspecified adrenal gland: Secondary | ICD-10-CM

## 2014-01-13 DIAGNOSIS — R609 Edema, unspecified: Secondary | ICD-10-CM | POA: Insufficient documentation

## 2014-01-13 DIAGNOSIS — I8000 Phlebitis and thrombophlebitis of superficial vessels of unspecified lower extremity: Secondary | ICD-10-CM

## 2014-01-13 DIAGNOSIS — R599 Enlarged lymph nodes, unspecified: Secondary | ICD-10-CM

## 2014-01-13 DIAGNOSIS — M7989 Other specified soft tissue disorders: Secondary | ICD-10-CM

## 2014-01-13 LAB — CBC WITH DIFFERENTIAL/PLATELET
BASO%: 0 % (ref 0.0–2.0)
Basophils Absolute: 0 10*3/uL (ref 0.0–0.1)
EOS%: 0.4 % (ref 0.0–7.0)
Eosinophils Absolute: 0 10*3/uL (ref 0.0–0.5)
HCT: 28.1 % — ABNORMAL LOW (ref 34.8–46.6)
HEMOGLOBIN: 9 g/dL — AB (ref 11.6–15.9)
LYMPH#: 1.9 10*3/uL (ref 0.9–3.3)
LYMPH%: 38.2 % (ref 14.0–49.7)
MCH: 32.1 pg (ref 25.1–34.0)
MCHC: 32 g/dL (ref 31.5–36.0)
MCV: 100.4 fL (ref 79.5–101.0)
MONO#: 0.8 10*3/uL (ref 0.1–0.9)
MONO%: 16.3 % — ABNORMAL HIGH (ref 0.0–14.0)
NEUT#: 2.2 10*3/uL (ref 1.5–6.5)
NEUT%: 45.1 % (ref 38.4–76.8)
Platelets: 181 10*3/uL (ref 145–400)
RBC: 2.8 10*6/uL — ABNORMAL LOW (ref 3.70–5.45)
RDW: 20.6 % — AB (ref 11.2–14.5)
WBC: 4.8 10*3/uL (ref 3.9–10.3)

## 2014-01-13 LAB — COMPREHENSIVE METABOLIC PANEL (CC13)
ALBUMIN: 3.4 g/dL — AB (ref 3.5–5.0)
ALT: 12 U/L (ref 0–55)
AST: 17 U/L (ref 5–34)
Alkaline Phosphatase: 101 U/L (ref 40–150)
Anion Gap: 12 mEq/L — ABNORMAL HIGH (ref 3–11)
BUN: 9.5 mg/dL (ref 7.0–26.0)
CALCIUM: 9.5 mg/dL (ref 8.4–10.4)
CO2: 26 mEq/L (ref 22–29)
Chloride: 103 mEq/L (ref 98–109)
Creatinine: 0.8 mg/dL (ref 0.6–1.1)
Glucose: 95 mg/dl (ref 70–140)
POTASSIUM: 4.5 meq/L (ref 3.5–5.1)
Sodium: 141 mEq/L (ref 136–145)
TOTAL PROTEIN: 6.5 g/dL (ref 6.4–8.3)
Total Bilirubin: 0.25 mg/dL (ref 0.20–1.20)

## 2014-01-13 LAB — MAGNESIUM (CC13): Magnesium: 2 mg/dL (ref 1.5–2.5)

## 2014-01-13 NOTE — Progress Notes (Addendum)
[facility] [phone]   Fax:(336) [place] [address] [place] [address]  DIAGNOSIS: Stage IV (T1b, N2, M1b) non-small cell lung cancer, adenocarcinoma, presented with a right lower lobe lung mass as well as mediastinal, hilar, bilateral supraclavicular lymphadenopathy in addition to extensive liver metastases and retroperitoneal lymphadenopathy diagnosed in December of 2014.  MOLECULAR BIOMARKERS: [facility] ONE biomarker testing revealed TP53 E204*, KEAP1 R683f*25+, NFKBIA amplification, NKX2-1 amplification, SMARCA4 E463*. The patient was negative for EGFR and ALK gene translocation   PRIOR THERAPY: Systemic chemotherapy with carboplatin for AUC of 5 and Alimta 500 mg/M2 every 3 weeks, status post 3 cycles. First dose 09/02/2013.   CURRENT THERAPY: Systemic chemotherapy with cisplatin 100 mg/M2 on days 1 and gemcitabine 800 mg/M2 on days 1 and 8 every 3 weeks, first cycle on 11/12/2013. Status post 3 cycles.   CHEMOTHERAPY INTENT: palliative  CURRENT # OF CHEMOTHERAPY CYCLES: 4  CURRENT ANTIEMETICS: Zofran, dexamethasone and Compazine.  CURRENT SMOKING STATUS: former smoker  ORAL CHEMOTHERAPY AND CONSENT: None  CURRENT BISPHOSPHONATES USE: None  PAIN MANAGEMENT: 0/10  NARCOTICS INDUCED CONSTIPATION: None  LIVING WILL AND CODE STATUS: Full Code  INTERVAL HISTORY: Amber SCHAFFER89y.o. female returns to the clinic today for follow up visit accompanied by her husband. The patient is feeling fine today with no specific complaints except for fatigue.overall she's tolerating her chemotherapy relatively well. She does note some bruising on her left upper extremity with some nodularity. She doesn't recall any specific trauma. She also reports that she is having increasing difficulty with peripheral access requests a Port-A-Cath. The patient denied having any significant nausea  or vomiting. She denied having any fever or chills. She has no significant weight loss or night sweats. She denied having any significant chest pain, shortness of breath, cough or hemoptysis. She recently had a restaging CT scan of the chest, abdomen and pelvis with contrast and presents to discuss the results as well as proceed with cycle #4 of her chemotherapy. She complains of new onset left lower extremity edema, denies any specific trauma.  MEDICAL HISTORY: Past Medical History  Diagnosis Date  . HTN (hypertension)   . Cancer     lung ca    ALLERGIES:  is allergic to hctz and plendil.  MEDICATIONS:  Current Outpatient Prescriptions  Medication Sig Dispense Refill  . amLODipine (NORVASC) 10 MG tablet Take 10 mg by mouth daily.      .Marland Kitchenaspirin EC 81 MG tablet Take 81 mg by mouth daily.      . calcium carbonate (TUMS - DOSED IN MG ELEMENTAL CALCIUM) 500 MG chewable tablet Chew 1 tablet by mouth daily as needed for indigestion or heartburn.      .Marland Kitchenlisinopril (PRINIVIL,ZESTRIL) 20 MG tablet Take 20 mg by mouth daily.      . magnesium oxide (MAG-OX) 400 (241.3 MG) MG tablet Take 1 tablet (400 mg total) by mouth 2 (two) times daily.  60 tablet  1  . nebivolol (BYSTOLIC) 10 MG tablet Take 10 mg by mouth daily.      .Marland KitchenoxyCODONE (ROXICODONE) 5 MG immediate release tablet Take 1 tablet (5 mg total) by mouth every 6 (six) hours as needed for severe pain.  60 tablet  0  . prochlorperazine (COMPAZINE) 10 MG tablet Take 1 tablet (10 mg total) by mouth every 6 (six) hours as needed for nausea or vomiting.  60 tablet  1  . folic acid (FOLVITE) 1 MG tablet Take 1 tablet (1 mg total) by mouth daily.  30 tablet  4   No current facility-administered medications for this visit.    REVIEW OF SYSTEMS:  Constitutional: negative Eyes: negative Ears, nose, mouth, throat, and face: negative Respiratory: negative Cardiovascular: positive for lower extremity edema and left leg Gastrointestinal: positive for  abdominal pain Genitourinary:negative Integument/breast: negative Hematologic/lymphatic: negative Musculoskeletal:negative Neurological: negative Behavioral/Psych: negative Endocrine: negative Allergic/Immunologic: negative   PHYSICAL EXAMINATION: General appearance: alert, cooperative and no distress Head: Normocephalic, without obvious abnormality, atraumatic Neck: no adenopathy, no JVD, supple, symmetrical, trachea midline and thyroid not enlarged, symmetric, no tenderness/mass/nodules Lymph nodes: Cervical, supraclavicular, and axillary nodes normal. Resp: clear to auscultation bilaterally Back: symmetric, no curvature. ROM normal. No CVA tenderness. Cardio: regular rate and rhythm, S1, S2 normal, no murmur, click, rub or gallop GI: soft, non-tender; bowel sounds normal; no masses,  no organomegaly Extremities: edema 1+ 2+ soft tissue edema left lower extremity Neurologic: Alert and oriented X 3, normal strength and tone. Normal symmetric reflexes. Normal coordination and gait Skin: 2 areas of ecchymosis on the left upper extremity with some superficial phlebitis properties  ECOG PERFORMANCE STATUS: 1 - Symptomatic but completely ambulatory  Blood pressure 124/74, pulse 77, temperature 97.3 F (36.3 C), temperature source Oral, resp. rate 18, height 5' 2"  (1.575 m), weight 110 lb (49.896 kg).  LABORATORY DATA: Lab Results  Component Value Date   WBC 4.8 01/13/2014   HGB 9.0* 01/13/2014   HCT 28.1* 01/13/2014   MCV 100.4 01/13/2014   PLT 181 01/13/2014      Chemistry      Component Value Date/Time   NA 141 01/13/2014 1312   K 4.5 01/13/2014 1312   CO2 26 01/13/2014 1312   BUN 9.5 01/13/2014 1312   CREATININE 0.8 01/13/2014 1312   CREATININE 0.80 08/21/2013 0800      Component Value Date/Time   CALCIUM 9.5 01/13/2014 1312       RADIOGRAPHIC STUDIES: Ct Chest W Contrast  01/12/2014   CLINICAL DATA:  Lung cancer.  EXAM: CT CHEST, ABDOMEN, AND PELVIS WITH CONTRAST   TECHNIQUE: Multidetector CT imaging of the chest, abdomen and pelvis was performed following the standard protocol during bolus administration of intravenous contrast.  CONTRAST:  147m OMNIPAQUE IOHEXOL 300 MG/ML  SOLN  COMPARISON:  11/09/2013  FINDINGS: CT CHEST FINDINGS  There are small bilateral pleural effusions which appear decreased in volume from previous exam. Mild centrilobular emphysema identified. There is a right lower lobe peribronchovascular nodule which measures 1.6 x 1.6 cm, image 33/series 5. Previously this measured 2.8 x 2.2 cm. No new or enlarging pulmonary nodules or masses.  The heart size appears normal. No pericardial effusion. Sub- carinal lymph node measures 9 mm, image 27/series 2. Previously 1.2 cm. Index left hilar lymph node measures 0.5 cm, image 28/series 2. Previously 1.1 cm. The pre-vascular node measures 0.3 cm, image 23/series 2. Previously 1 cm.  No axillary or supraclavicular adenopathy. Review of the visualized osseous structures is negative for aggressive lytic or sclerotic bone lesion.  CT ABDOMEN AND PELVIS FINDINGS  Multi focal liver metastasis are again identified. The index lesion within the dome measures 4.6 x 4.9 cm, image 46/ series 2. Previously 7.7 x 6.9 cm. Index lesion in the lateral segment of left hepatic lobe measures 3.6 x 2.5 cm, image 56/ series 2. Previously 6.5 x 4.6 cm. The gallbladder appears normal. No biliary dilatation. Normal  appearance of the pancreas. The spleen is unremarkable.  Previous left adrenal metastasis measures 1.3 x 0.5 cm, image 55/series 2. Previously 1.6 x 1.1 cm. Right kidney is normal. The left kidney is normal. The urinary bladder appears normal. Fibroid uterus is again noted. Exophytic fibroid arising from the right side of the uterus measures 5.7 x 5.9 cm, image 99/series 2. This is compared with 5.9 x 4.7 cm previously.  Calcified atherosclerotic disease involves the abdominal aorta. There is no aneurysm. Index periaortic lymph  node measures 8 mm, image 69/series 2. Previously 1.3 cm. More distally, there is a 7 mm nodule, image 74/series 2. Previously 1 cm. Left common iliac lymph node measures 6 mm, image 85/series 2. Previously 1.2 cm.  The stomach is normal. The small bowel loops have a normal course and caliber without obstruction. Normal appearance of the colon.  Review of the visualized bony structures shows no aggressive lytic or sclerotic bone lesions. Degenerative disc disease is noted within the lumbar spine.  IMPRESSION: 1. Interval decrease in size of right lung lesion. 2. Improvement in multi focal hepatic metastasis. 3. Retroperitoneal adenopathy is improved from previous exam.   Electronically Signed   By: Kerby Moors M.D.   On: 01/12/2014 12:46   Ct Abdomen Pelvis W Contrast  01/12/2014   CLINICAL DATA:  Lung cancer.  EXAM: CT CHEST, ABDOMEN, AND PELVIS WITH CONTRAST  TECHNIQUE: Multidetector CT imaging of the chest, abdomen and pelvis was performed following the standard protocol during bolus administration of intravenous contrast.  CONTRAST:  165m OMNIPAQUE IOHEXOL 300 MG/ML  SOLN  COMPARISON:  11/09/2013  FINDINGS: CT CHEST FINDINGS  There are small bilateral pleural effusions which appear decreased in volume from previous exam. Mild centrilobular emphysema identified. There is a right lower lobe peribronchovascular nodule which measures 1.6 x 1.6 cm, image 33/series 5. Previously this measured 2.8 x 2.2 cm. No new or enlarging pulmonary nodules or masses.  The heart size appears normal. No pericardial effusion. Sub- carinal lymph node measures 9 mm, image 27/series 2. Previously 1.2 cm. Index left hilar lymph node measures 0.5 cm, image 28/series 2. Previously 1.1 cm. The pre-vascular node measures 0.3 cm, image 23/series 2. Previously 1 cm.  No axillary or supraclavicular adenopathy. Review of the visualized osseous structures is negative for aggressive lytic or sclerotic bone lesion.  CT ABDOMEN AND PELVIS  FINDINGS  Multi focal liver metastasis are again identified. The index lesion within the dome measures 4.6 x 4.9 cm, image 46/ series 2. Previously 7.7 x 6.9 cm. Index lesion in the lateral segment of left hepatic lobe measures 3.6 x 2.5 cm, image 56/ series 2. Previously 6.5 x 4.6 cm. The gallbladder appears normal. No biliary dilatation. Normal appearance of the pancreas. The spleen is unremarkable.  Previous left adrenal metastasis measures 1.3 x 0.5 cm, image 55/series 2. Previously 1.6 x 1.1 cm. Right kidney is normal. The left kidney is normal. The urinary bladder appears normal. Fibroid uterus is again noted. Exophytic fibroid arising from the right side of the uterus measures 5.7 x 5.9 cm, image 99/series 2. This is compared with 5.9 x 4.7 cm previously.  Calcified atherosclerotic disease involves the abdominal aorta. There is no aneurysm. Index periaortic lymph node measures 8 mm, image 69/series 2. Previously 1.3 cm. More distally, there is a 7 mm nodule, image 74/series 2. Previously 1 cm. Left common iliac lymph node measures 6 mm, image 85/series 2. Previously 1.2 cm.  The stomach is normal. The small  bowel loops have a normal course and caliber without obstruction. Normal appearance of the colon.  Review of the visualized bony structures shows no aggressive lytic or sclerotic bone lesions. Degenerative disc disease is noted within the lumbar spine.  IMPRESSION: 1. Interval decrease in size of right lung lesion. 2. Improvement in multi focal hepatic metastasis. 3. Retroperitoneal adenopathy is improved from previous exam.   Electronically Signed   By: Kerby Moors M.D.   On: 01/12/2014 12:46   ASSESSMENT AND PLAN: This is a very pleasant 63 years old white female recently diagnosed with metastatic non-small cell lung cancer, adenocarcinoma presented with right lower lobe lung mass in addition to mediastinal and supraclavicular lymphadenopathy as well as diffuse metastatic liver lesions and  retroperitoneal lymphadenopathy. She underwent 3 cycles of systemic chemotherapy with carboplatin and Alimta but unfortunately has evidence for disease progression especially in the liver and new small left adrenal metastasis. She is currently undergoing second line chemotherapy with cisplatin and gemcitabine status post 3 cycles and tolerating it fairly well. Patient was discussed with him also seen by Dr. Julien Nordmann. Her recent restaging CT scan revealed improvement in her disease with interval decrease in the right lung lesion as well as improvement in the multi-hepatic lesions. She also has some decrease in her retroperitoneal adenopathy. These results were discussed with the patient and her husband. She will proceed with cycle #4 of her dose reduced cisplatin and gemcitabine as scheduled. She'll followup in 3 weeks prior to cycle #5. For the areas of ecchymosis and superficial phlebitis, the patient was advised to utilize warm compresses. Doppler study was performed to evaluate the left lower extremity edema and was negative for DVT.  For her poor peripheral venous access we will schedule the patient to have Port-A-Cath placed by interventional radiology.  She was advised to call immediately if she has any concerning symptoms in the interval. The patient voices understanding of current disease status and treatment options and is in agreement with the current care plan.  All questions were answered. The patient knows to call the clinic with any problems, questions or concerns. We can certainly see the patient much sooner if necessary.  Carlton Adam, PA-C  ADDENDUM: Hematology/Oncology Attending:  I had a face to face encounter with the patient. I recommended her care plan. This is a very pleasant 63 years old white female with metastatic adenocarcinoma questionable for lung primary but other possibility could be a cholangiocarcinoma. She status post 3 cycle of systemic chemotherapy with  carboplatin and Alimta but has evidence for disease progression. The patient is currently on treatment with systemic chemotherapy with cisplatin and gemcitabine status post 3 cycles. Her recent scan showed significant improvement in her disease. I discussed the scan results and showed the images to the patient and her husband. I recommended for her to proceed with 3 more cycles of the same regimen. The patient requested a Port-A-Cath placement and I will refer her to interventional radiology for placement of Port-A-Cath. She would proceed with cycle #4 tomorrow as scheduled and the patient would come back for follow up visit in 3 weeks with the next cycle of her treatment. She was advised to call immediately if she has any concerning symptoms in the interval.  Disclaimer: This note was dictated with voice recognition software. Similar sounding words can inadvertently be transcribed and may not be corrected upon review. Curt Bears, MD 01/17/2014

## 2014-01-13 NOTE — Progress Notes (Signed)
*  Preliminary Results* Left lower extremity venous duplex completed. Left lower extremity is negative for deep vein thrombosis. There is no evidence of left Baker's cyst.  01/13/2014 3:32 PM  Maudry Mayhew, RVT, RDCS, RDMS

## 2014-01-14 ENCOUNTER — Telehealth: Payer: Self-pay | Admitting: Internal Medicine

## 2014-01-14 ENCOUNTER — Ambulatory Visit (HOSPITAL_BASED_OUTPATIENT_CLINIC_OR_DEPARTMENT_OTHER): Payer: 59

## 2014-01-14 VITALS — BP 135/73 | HR 74 | Temp 98.0°F | Resp 18

## 2014-01-14 DIAGNOSIS — C3490 Malignant neoplasm of unspecified part of unspecified bronchus or lung: Secondary | ICD-10-CM

## 2014-01-14 DIAGNOSIS — C787 Secondary malignant neoplasm of liver and intrahepatic bile duct: Secondary | ICD-10-CM

## 2014-01-14 DIAGNOSIS — Z5111 Encounter for antineoplastic chemotherapy: Secondary | ICD-10-CM

## 2014-01-14 DIAGNOSIS — C3430 Malignant neoplasm of lower lobe, unspecified bronchus or lung: Secondary | ICD-10-CM

## 2014-01-14 MED ORDER — SODIUM CHLORIDE 0.9 % IV SOLN
150.0000 mg | Freq: Once | INTRAVENOUS | Status: AC
  Administered 2014-01-14: 150 mg via INTRAVENOUS
  Filled 2014-01-14: qty 5

## 2014-01-14 MED ORDER — PALONOSETRON HCL INJECTION 0.25 MG/5ML
0.2500 mg | Freq: Once | INTRAVENOUS | Status: AC
  Administered 2014-01-14: 0.25 mg via INTRAVENOUS

## 2014-01-14 MED ORDER — DEXAMETHASONE SODIUM PHOSPHATE 20 MG/5ML IJ SOLN
INTRAMUSCULAR | Status: AC
  Filled 2014-01-14: qty 5

## 2014-01-14 MED ORDER — PALONOSETRON HCL INJECTION 0.25 MG/5ML
INTRAVENOUS | Status: AC
  Filled 2014-01-14: qty 5

## 2014-01-14 MED ORDER — SODIUM CHLORIDE 0.9 % IV SOLN
800.0000 mg/m2 | Freq: Once | INTRAVENOUS | Status: AC
  Administered 2014-01-14: 1178 mg via INTRAVENOUS
  Filled 2014-01-14: qty 30.98

## 2014-01-14 MED ORDER — POTASSIUM CHLORIDE 2 MEQ/ML IV SOLN
Freq: Once | INTRAVENOUS | Status: AC
  Administered 2014-01-14: 09:00:00 via INTRAVENOUS
  Filled 2014-01-14: qty 10

## 2014-01-14 MED ORDER — SODIUM CHLORIDE 0.9 % IV SOLN
Freq: Once | INTRAVENOUS | Status: AC
  Administered 2014-01-14: 09:00:00 via INTRAVENOUS

## 2014-01-14 MED ORDER — DEXAMETHASONE SODIUM PHOSPHATE 20 MG/5ML IJ SOLN
12.0000 mg | Freq: Once | INTRAMUSCULAR | Status: AC
  Administered 2014-01-14: 12 mg via INTRAVENOUS

## 2014-01-14 MED ORDER — LIDOCAINE-PRILOCAINE 2.5-2.5 % EX CREA: 1.0000 "application " | TOPICAL_CREAM | CUTANEOUS | Status: DC | PRN

## 2014-01-14 MED ORDER — SODIUM CHLORIDE 0.9 % IV SOLN
75.0000 mg/m2 | Freq: Once | INTRAVENOUS | Status: AC
  Administered 2014-01-14: 110 mg via INTRAVENOUS
  Filled 2014-01-14: qty 110

## 2014-01-14 NOTE — Patient Instructions (Signed)
[facility] Discharge Instructions for Patients Receiving Chemotherapy  Today you received the following chemotherapy agents:  Cisplatin and Gemzar  To help prevent nausea and vomiting after your treatment, we encourage you to take your nausea medication as ordered per MD.   If you develop nausea and vomiting that is not controlled by your nausea medication, call the clinic.   BELOW ARE SYMPTOMS THAT SHOULD BE REPORTED IMMEDIATELY:  *FEVER GREATER THAN 100.5 F  *CHILLS WITH OR WITHOUT FEVER  NAUSEA AND VOMITING THAT IS NOT CONTROLLED WITH YOUR NAUSEA MEDICATION  *UNUSUAL SHORTNESS OF BREATH  *UNUSUAL BRUISING OR BLEEDING  TENDERNESS IN MOUTH AND THROAT WITH OR WITHOUT PRESENCE OF ULCERS  *URINARY PROBLEMS  *BOWEL PROBLEMS  UNUSUAL RASH Items with * indicate a potential emergency and should be followed up as soon as possible.  Feel free to call the clinic you have any questions or concerns. The clinic phone number is [phone].

## 2014-01-14 NOTE — Telephone Encounter (Signed)
gv adn printed appt sched and avs for pt for May and June °

## 2014-01-15 ENCOUNTER — Encounter (HOSPITAL_COMMUNITY): Payer: Self-pay | Admitting: Pharmacy Technician

## 2014-01-15 ENCOUNTER — Other Ambulatory Visit: Payer: Self-pay | Admitting: Radiology

## 2014-01-16 NOTE — Patient Instructions (Signed)
Your CT scan revealed improvement in your disease particularly with decrease in size of the lung lesion and the multiple liver lesions Continue labs and chemotherapy as scheduled Poor peripheral venous access, we're scheduling you to have a Port-A-Cath placed by interventional radiology Followup in 3 weeks

## 2014-01-19 ENCOUNTER — Other Ambulatory Visit: Payer: Self-pay | Admitting: Radiology

## 2014-01-20 ENCOUNTER — Ambulatory Visit (HOSPITAL_COMMUNITY)
Admission: RE | Admit: 2014-01-20 | Discharge: 2014-01-20 | Disposition: A | Discharge status: Home / Self Care | Payer: 59 | Source: Ambulatory Visit | Attending: Physician Assistant | Admitting: Physician Assistant

## 2014-01-20 ENCOUNTER — Other Ambulatory Visit: Payer: Self-pay | Admitting: Physician Assistant

## 2014-01-20 ENCOUNTER — Encounter (HOSPITAL_COMMUNITY): Payer: Self-pay

## 2014-01-20 DIAGNOSIS — C3490 Malignant neoplasm of unspecified part of unspecified bronchus or lung: Secondary | ICD-10-CM | POA: Insufficient documentation

## 2014-01-20 DIAGNOSIS — I10 Essential (primary) hypertension: Secondary | ICD-10-CM | POA: Insufficient documentation

## 2014-01-20 DIAGNOSIS — Z7982 Long term (current) use of aspirin: Secondary | ICD-10-CM | POA: Insufficient documentation

## 2014-01-20 DIAGNOSIS — Z79899 Other long term (current) drug therapy: Secondary | ICD-10-CM | POA: Insufficient documentation

## 2014-01-20 LAB — CBC WITH DIFFERENTIAL/PLATELET
Basophils Absolute: 0 10*3/uL (ref 0.0–0.1)
Basophils Relative: 0 % (ref 0–1)
Eosinophils Absolute: 0 10*3/uL (ref 0.0–0.7)
Eosinophils Relative: 0 % (ref 0–5)
HCT: 28 % — ABNORMAL LOW (ref 36.0–46.0)
Hemoglobin: 9.5 g/dL — ABNORMAL LOW (ref 12.0–15.0)
Lymphocytes Relative: 48 % — ABNORMAL HIGH (ref 12–46)
Lymphs Abs: 1.1 10*3/uL (ref 0.7–4.0)
MCH: 33.5 pg (ref 26.0–34.0)
MCHC: 33.9 g/dL (ref 30.0–36.0)
MCV: 98.6 fL (ref 78.0–100.0)
Monocytes Absolute: 0.1 10*3/uL (ref 0.1–1.0)
Monocytes Relative: 4 % (ref 3–12)
Neutro Abs: 1.1 10*3/uL — ABNORMAL LOW (ref 1.7–7.7)
Neutrophils Relative %: 48 % (ref 43–77)
Platelets: 297 10*3/uL (ref 150–400)
RBC: 2.84 MIL/uL — ABNORMAL LOW (ref 3.87–5.11)
RDW: 17.7 % — ABNORMAL HIGH (ref 11.5–15.5)
WBC: 2.4 10*3/uL — ABNORMAL LOW (ref 4.0–10.5)

## 2014-01-20 LAB — PROTIME-INR
INR: 0.86 (ref 0.00–1.49)
Prothrombin Time: 11.6 seconds (ref 11.6–15.2)

## 2014-01-20 MED ORDER — HEPARIN SOD (PORK) LOCK FLUSH 100 UNIT/ML IV SOLN
INTRAVENOUS | Status: DC
Start: 2014-01-20 — End: 2014-01-21
  Filled 2014-01-20: qty 5

## 2014-01-20 MED ORDER — MIDAZOLAM HCL 2 MG/2ML IJ SOLN
INTRAMUSCULAR | Status: AC | PRN
  Administered 2014-01-20: 2 mg via INTRAVENOUS

## 2014-01-20 MED ORDER — LIDOCAINE HCL 1 % IJ SOLN
INTRAMUSCULAR | Status: AC
  Filled 2014-01-20: qty 20

## 2014-01-20 MED ORDER — MIDAZOLAM HCL 2 MG/2ML IJ SOLN
INTRAMUSCULAR | Status: AC
  Filled 2014-01-20: qty 4

## 2014-01-20 MED ORDER — SODIUM CHLORIDE 0.9 % IV SOLN
INTRAVENOUS | Status: DC
  Administered 2014-01-20: 12:00:00 via INTRAVENOUS

## 2014-01-20 MED ORDER — CEFAZOLIN SODIUM-DEXTROSE 2-3 GM-% IV SOLR
2.0000 g | Freq: Once | INTRAVENOUS | Status: AC
  Administered 2014-01-20: 2 g via INTRAVENOUS
  Filled 2014-01-20: qty 50

## 2014-01-20 MED ORDER — HEPARIN SOD (PORK) LOCK FLUSH 100 UNIT/ML IV SOLN
500.0000 [IU] | Freq: Once | INTRAVENOUS | Status: AC
Start: 2014-01-20 — End: 2014-01-20
  Administered 2014-01-20: 500 [IU] via INTRAVENOUS

## 2014-01-20 MED ORDER — FENTANYL CITRATE 0.05 MG/ML IJ SOLN
INTRAMUSCULAR | Status: AC | PRN
  Administered 2014-01-20 (×2): 50 ug via INTRAVENOUS

## 2014-01-20 MED ORDER — LIDOCAINE-EPINEPHRINE (PF) 2 %-1:200000 IJ SOLN
INTRAMUSCULAR | Status: AC
  Filled 2014-01-20: qty 20

## 2014-01-20 MED ORDER — FENTANYL CITRATE 0.05 MG/ML IJ SOLN
INTRAMUSCULAR | Status: AC
  Filled 2014-01-20: qty 4

## 2014-01-20 NOTE — H&P (Signed)
Chief Complaint: "I am here for a port." Referring Physician: Awilda Metro PA-C HPI: Amber Hayden is an 63 y.o. female with metastatic non small cell lung cancer scheduled for chemotherapy tomorrow. IR received request for image guided port a catheter placement. She denies any chest pain, shortness of breath or palpitations. She denies any active signs of bleeding or excessive bruising. She denies any recent fever or chills. The patient denies any history of sleep apnea or chronic oxygen use. She has previously tolerated sedation without complications.    Past Medical History:  Past Medical History  Diagnosis Date  . HTN (hypertension)   . Cancer     lung ca    Past Surgical History: History reviewed. No pertinent past surgical history.  Family History: History reviewed. No pertinent family history.  Social History:  reports that she has been smoking.  She does not have any smokeless tobacco history on file. Her alcohol and drug histories are not on file.  Allergies:  Allergies  Allergen Reactions  . Hctz [Hydrochlorothiazide] Nausea Only  . Plendil [Felodipine] Other (See Comments)    REACTION: fatigue    Medications:   Medication List    ASK your doctor about these medications       amLODipine 10 MG tablet  Commonly known as:  NORVASC  Take 10 mg by mouth every morning.     aspirin EC 81 MG tablet  Take 81 mg by mouth every morning.     calcium carbonate 500 MG chewable tablet  Commonly known as:  TUMS - dosed in mg elemental calcium  Chew 1 tablet by mouth 3 (three) times daily as needed for indigestion or heartburn.     lidocaine-prilocaine cream  Commonly known as:  EMLA  Apply 1 application topically as needed. Apply to port 1 hour before chemo appointment     lisinopril 20 MG tablet  Commonly known as:  PRINIVIL,ZESTRIL  Take 20 mg by mouth every morning.     magnesium oxide 400 (241.3 MG) MG tablet  Commonly known as:  MAG-OX  Take 1 tablet (400 mg  total) by mouth 2 (two) times daily.     nebivolol 10 MG tablet  Commonly known as:  BYSTOLIC  Take 10 mg by mouth every morning.     oxyCODONE 5 MG immediate release tablet  Commonly known as:  ROXICODONE  Take 1 tablet (5 mg total) by mouth every 6 (six) hours as needed for severe pain.     prochlorperazine 10 MG tablet  Commonly known as:  COMPAZINE  Take 1 tablet (10 mg total) by mouth every 6 (six) hours as needed for nausea or vomiting.       Please HPI for pertinent positives, otherwise complete 10 system ROS negative.  Physical Exam: BP 115/69  Pulse 75  Temp(Src) 97.6 F (36.4 C) (Oral)  Resp 20  SpO2 97% There is no weight on file to calculate BMI.  General Appearance:  Alert, cooperative, no distress  Head:  Normocephalic, without obvious abnormality, atraumatic  Neck: Supple, symmetrical, trachea midline  Lungs:   Clear to auscultation bilaterally, no w/r/r, respirations unlabored without use of accessory muscles.  Chest Wall:  No tenderness or deformity  Heart:  Regular rate and rhythm, S1, S2 normal, no murmur, rub or gallop.  Abdomen:   Soft, non-tender, non distended.  Extremities: Extremities normal, atraumatic, no cyanosis or edema  Neurologic: Normal affect, no gross deficits.   Results for orders placed during the hospital  encounter of 01/20/14 (from the past 48 hour(s))  CBC WITH DIFFERENTIAL     Status: Abnormal   Collection Time    01/20/14 11:52 AM      Result Value Ref Range   WBC 2.4 (*) 4.0 - 10.5 K/uL   RBC 2.84 (*) 3.87 - 5.11 MIL/uL   Hemoglobin 9.5 (*) 12.0 - 15.0 g/dL   HCT 28.0 (*) 36.0 - 46.0 %   MCV 98.6  78.0 - 100.0 fL   MCH 33.5  26.0 - 34.0 pg   MCHC 33.9  30.0 - 36.0 g/dL   RDW 17.7 (*) 11.5 - 15.5 %   Platelets 297  150 - 400 K/uL   Neutrophils Relative % 48  43 - 77 %   Neutro Abs 1.1 (*) 1.7 - 7.7 K/uL   Lymphocytes Relative 48 (*) 12 - 46 %   Lymphs Abs 1.1  0.7 - 4.0 K/uL   Monocytes Relative 4  3 - 12 %   Monocytes  Absolute 0.1  0.1 - 1.0 K/uL   Eosinophils Relative 0  0 - 5 %   Eosinophils Absolute 0.0  0.0 - 0.7 K/uL   Basophils Relative 0  0 - 1 %   Basophils Absolute 0.0  0.0 - 0.1 K/uL  PROTIME-INR     Status: None   Collection Time    01/20/14 11:52 AM      Result Value Ref Range   Prothrombin Time 11.6  11.6 - 15.2 seconds   INR 0.86  0.00 - 1.49   No results found.  Assessment/Plan Metastatic non small cell lung cancer Request for image guided port a catheter placement Patient has been NPO, no blood thinners, afebrile, labs reviewed, ancef ordered. Risks and Benefits discussed with the patient. All of the patient's questions were answered, patient is agreeable to proceed. Consent signed and in chart.   Hedy Jacob PA-C 01/20/2014, 1:22 PM

## 2014-01-20 NOTE — Procedures (Signed)
Successful RT IJ POWER PORT TIP SVC/RA NO COMP STABLE READY FOR USE  

## 2014-01-20 NOTE — Discharge Instructions (Signed)

## 2014-01-21 ENCOUNTER — Ambulatory Visit (HOSPITAL_BASED_OUTPATIENT_CLINIC_OR_DEPARTMENT_OTHER): Payer: 59 | Admitting: *Deleted

## 2014-01-21 ENCOUNTER — Ambulatory Visit (HOSPITAL_BASED_OUTPATIENT_CLINIC_OR_DEPARTMENT_OTHER): Payer: 59

## 2014-01-21 VITALS — BP 134/61 | HR 78 | Temp 97.9°F | Resp 20

## 2014-01-21 DIAGNOSIS — Z5111 Encounter for antineoplastic chemotherapy: Secondary | ICD-10-CM

## 2014-01-21 DIAGNOSIS — C3490 Malignant neoplasm of unspecified part of unspecified bronchus or lung: Secondary | ICD-10-CM

## 2014-01-21 LAB — COMPREHENSIVE METABOLIC PANEL (CC13)
ALK PHOS: 103 U/L (ref 40–150)
ALT: 23 U/L (ref 0–55)
AST: 21 U/L (ref 5–34)
Albumin: 3.4 g/dL — ABNORMAL LOW (ref 3.5–5.0)
Anion Gap: 11 mEq/L (ref 3–11)
BILIRUBIN TOTAL: 0.23 mg/dL (ref 0.20–1.20)
BUN: 14.3 mg/dL (ref 7.0–26.0)
CO2: 26 meq/L (ref 22–29)
Calcium: 9.2 mg/dL (ref 8.4–10.4)
Chloride: 100 mEq/L (ref 98–109)
Creatinine: 0.8 mg/dL (ref 0.6–1.1)
Glucose: 129 mg/dl (ref 70–140)
POTASSIUM: 4.1 meq/L (ref 3.5–5.1)
SODIUM: 137 meq/L (ref 136–145)
TOTAL PROTEIN: 6.5 g/dL (ref 6.4–8.3)

## 2014-01-21 LAB — MAGNESIUM (CC13): Magnesium: 1.5 mg/dl (ref 1.5–2.5)

## 2014-01-21 MED ORDER — PROCHLORPERAZINE MALEATE 10 MG PO TABS
ORAL_TABLET | ORAL | Status: AC
  Filled 2014-01-21: qty 1

## 2014-01-21 MED ORDER — SODIUM CHLORIDE 0.9 % IJ SOLN
10.0000 mL | INTRAMUSCULAR | Status: DC | PRN
  Administered 2014-01-21: 10 mL
  Filled 2014-01-21: qty 10

## 2014-01-21 MED ORDER — HEPARIN SOD (PORK) LOCK FLUSH 100 UNIT/ML IV SOLN
500.0000 [IU] | Freq: Once | INTRAVENOUS | Status: AC | PRN
  Administered 2014-01-21: 500 [IU]
  Filled 2014-01-21: qty 5

## 2014-01-21 MED ORDER — SODIUM CHLORIDE 0.9 % IJ SOLN
10.0000 mL | INTRAMUSCULAR | Status: DC | PRN
  Administered 2014-01-21: 10 mL via INTRAVENOUS
  Filled 2014-01-21: qty 10

## 2014-01-21 MED ORDER — SODIUM CHLORIDE 0.9 % IV SOLN
800.0000 mg/m2 | Freq: Once | INTRAVENOUS | Status: AC
  Administered 2014-01-21: 1178 mg via INTRAVENOUS
  Filled 2014-01-21: qty 30.98

## 2014-01-21 MED ORDER — SODIUM CHLORIDE 0.9 % IV SOLN
Freq: Once | INTRAVENOUS | Status: AC
  Administered 2014-01-21: 13:00:00 via INTRAVENOUS

## 2014-01-21 MED ORDER — PROCHLORPERAZINE MALEATE 10 MG PO TABS
10.0000 mg | ORAL_TABLET | Freq: Once | ORAL | Status: AC
  Administered 2014-01-21: 10 mg via ORAL

## 2014-01-21 NOTE — Patient Instructions (Signed)

## 2014-01-21 NOTE — Progress Notes (Signed)
1330-OK to treat with CBC results from 01/20/14 per Dr. Julien Nordmann, WBC-2.4 and ANC-1.1.

## 2014-01-21 NOTE — Patient Instructions (Signed)
[facility] Discharge Instructions for Patients Receiving Chemotherapy  Today you received the following chemotherapy agents:  Gemzar  To help prevent nausea and vomiting after your treatment, we encourage you to take your nausea medication as ordered per MD.   If you develop nausea and vomiting that is not controlled by your nausea medication, call the clinic.   BELOW ARE SYMPTOMS THAT SHOULD BE REPORTED IMMEDIATELY:  *FEVER GREATER THAN 100.5 F  *CHILLS WITH OR WITHOUT FEVER  NAUSEA AND VOMITING THAT IS NOT CONTROLLED WITH YOUR NAUSEA MEDICATION  *UNUSUAL SHORTNESS OF BREATH  *UNUSUAL BRUISING OR BLEEDING  TENDERNESS IN MOUTH AND THROAT WITH OR WITHOUT PRESENCE OF ULCERS  *URINARY PROBLEMS  *BOWEL PROBLEMS  UNUSUAL RASH Items with * indicate a potential emergency and should be followed up as soon as possible.  Feel free to call the clinic you have any questions or concerns. The clinic phone number is [phone].

## 2014-02-04 ENCOUNTER — Telehealth: Payer: Self-pay | Admitting: *Deleted

## 2014-02-04 ENCOUNTER — Ambulatory Visit: Payer: 59

## 2014-02-04 ENCOUNTER — Encounter: Payer: Self-pay | Admitting: Physician Assistant

## 2014-02-04 ENCOUNTER — Telehealth: Payer: Self-pay | Admitting: Internal Medicine

## 2014-02-04 ENCOUNTER — Ambulatory Visit (HOSPITAL_BASED_OUTPATIENT_CLINIC_OR_DEPARTMENT_OTHER): Payer: 59

## 2014-02-04 ENCOUNTER — Other Ambulatory Visit (HOSPITAL_BASED_OUTPATIENT_CLINIC_OR_DEPARTMENT_OTHER): Payer: 59

## 2014-02-04 ENCOUNTER — Ambulatory Visit (HOSPITAL_BASED_OUTPATIENT_CLINIC_OR_DEPARTMENT_OTHER): Payer: 59 | Admitting: Physician Assistant

## 2014-02-04 VITALS — BP 135/64 | HR 74 | Temp 97.7°F | Resp 18 | Ht 62.0 in | Wt 108.5 lb

## 2014-02-04 DIAGNOSIS — C3430 Malignant neoplasm of lower lobe, unspecified bronchus or lung: Secondary | ICD-10-CM

## 2014-02-04 DIAGNOSIS — H9190 Unspecified hearing loss, unspecified ear: Secondary | ICD-10-CM

## 2014-02-04 DIAGNOSIS — C3490 Malignant neoplasm of unspecified part of unspecified bronchus or lung: Secondary | ICD-10-CM

## 2014-02-04 DIAGNOSIS — C787 Secondary malignant neoplasm of liver and intrahepatic bile duct: Secondary | ICD-10-CM

## 2014-02-04 DIAGNOSIS — C7970 Secondary malignant neoplasm of unspecified adrenal gland: Secondary | ICD-10-CM

## 2014-02-04 DIAGNOSIS — Z5111 Encounter for antineoplastic chemotherapy: Secondary | ICD-10-CM

## 2014-02-04 DIAGNOSIS — R599 Enlarged lymph nodes, unspecified: Secondary | ICD-10-CM

## 2014-02-04 LAB — CBC WITH DIFFERENTIAL/PLATELET
BASO%: 0 % (ref 0.0–2.0)
BASOS ABS: 0 10*3/uL (ref 0.0–0.1)
EOS%: 0.7 % (ref 0.0–7.0)
Eosinophils Absolute: 0 10*3/uL (ref 0.0–0.5)
HEMATOCRIT: 25.6 % — AB (ref 34.8–46.6)
HEMOGLOBIN: 8.5 g/dL — AB (ref 11.6–15.9)
LYMPH%: 37.6 % (ref 14.0–49.7)
MCH: 33.6 pg (ref 25.1–34.0)
MCHC: 33.2 g/dL (ref 31.5–36.0)
MCV: 101.2 fL — ABNORMAL HIGH (ref 79.5–101.0)
MONO#: 0.5 10*3/uL (ref 0.1–0.9)
MONO%: 17.1 % — AB (ref 0.0–14.0)
NEUT#: 1.3 10*3/uL — ABNORMAL LOW (ref 1.5–6.5)
NEUT%: 44.6 % (ref 38.4–76.8)
Platelets: 170 10*3/uL (ref 145–400)
RBC: 2.53 10*6/uL — ABNORMAL LOW (ref 3.70–5.45)
RDW: 18.8 % — ABNORMAL HIGH (ref 11.2–14.5)
WBC: 2.9 10*3/uL — ABNORMAL LOW (ref 3.9–10.3)
lymph#: 1.1 10*3/uL (ref 0.9–3.3)

## 2014-02-04 LAB — COMPREHENSIVE METABOLIC PANEL (CC13)
ALK PHOS: 95 U/L (ref 40–150)
ALT: 11 U/L (ref 0–55)
ANION GAP: 9 meq/L (ref 3–11)
AST: 19 U/L (ref 5–34)
Albumin: 3.4 g/dL — ABNORMAL LOW (ref 3.5–5.0)
BUN: 13.7 mg/dL (ref 7.0–26.0)
CO2: 25 meq/L (ref 22–29)
CREATININE: 0.7 mg/dL (ref 0.6–1.1)
Calcium: 9.4 mg/dL (ref 8.4–10.4)
Chloride: 104 mEq/L (ref 98–109)
Glucose: 94 mg/dl (ref 70–140)
Potassium: 4.3 mEq/L (ref 3.5–5.1)
SODIUM: 138 meq/L (ref 136–145)
Total Bilirubin: 0.26 mg/dL (ref 0.20–1.20)
Total Protein: 6.4 g/dL (ref 6.4–8.3)

## 2014-02-04 LAB — MAGNESIUM (CC13): Magnesium: 2 mg/dl (ref 1.5–2.5)

## 2014-02-04 MED ORDER — PALONOSETRON HCL INJECTION 0.25 MG/5ML
INTRAVENOUS | Status: AC
  Filled 2014-02-04: qty 5

## 2014-02-04 MED ORDER — HEPARIN SOD (PORK) LOCK FLUSH 100 UNIT/ML IV SOLN
500.0000 [IU] | Freq: Once | INTRAVENOUS | Status: AC | PRN
  Administered 2014-02-04: 500 [IU]
  Filled 2014-02-04: qty 5

## 2014-02-04 MED ORDER — SODIUM CHLORIDE 0.9 % IV SOLN
800.0000 mg/m2 | Freq: Once | INTRAVENOUS | Status: AC
  Administered 2014-02-04: 1178 mg via INTRAVENOUS
  Filled 2014-02-04: qty 30.98

## 2014-02-04 MED ORDER — SODIUM CHLORIDE 0.9 % IV SOLN
Freq: Once | INTRAVENOUS | Status: AC
  Administered 2014-02-04: 10:00:00 via INTRAVENOUS

## 2014-02-04 MED ORDER — SODIUM CHLORIDE 0.9 % IV SOLN
150.0000 mg | Freq: Once | INTRAVENOUS | Status: AC
  Administered 2014-02-04: 150 mg via INTRAVENOUS
  Filled 2014-02-04: qty 5

## 2014-02-04 MED ORDER — DEXAMETHASONE SODIUM PHOSPHATE 20 MG/5ML IJ SOLN
INTRAMUSCULAR | Status: AC
  Filled 2014-02-04: qty 5

## 2014-02-04 MED ORDER — DEXAMETHASONE SODIUM PHOSPHATE 20 MG/5ML IJ SOLN
12.0000 mg | Freq: Once | INTRAMUSCULAR | Status: AC
  Administered 2014-02-04: 12 mg via INTRAVENOUS

## 2014-02-04 MED ORDER — PALONOSETRON HCL INJECTION 0.25 MG/5ML
0.2500 mg | Freq: Once | INTRAVENOUS | Status: AC
  Administered 2014-02-04: 0.25 mg via INTRAVENOUS

## 2014-02-04 MED ORDER — SODIUM CHLORIDE 0.9 % IV SOLN
60.0000 mg/m2 | Freq: Once | INTRAVENOUS | Status: AC
  Administered 2014-02-04: 88 mg via INTRAVENOUS
  Filled 2014-02-04: qty 88

## 2014-02-04 MED ORDER — POTASSIUM CHLORIDE 2 MEQ/ML IV SOLN
Freq: Once | INTRAVENOUS | Status: AC
  Administered 2014-02-04: 10:00:00 via INTRAVENOUS
  Filled 2014-02-04: qty 10

## 2014-02-04 MED ORDER — SODIUM CHLORIDE 0.9 % IJ SOLN
10.0000 mL | INTRAMUSCULAR | Status: DC | PRN
  Administered 2014-02-04: 10 mL
  Filled 2014-02-04: qty 10

## 2014-02-04 NOTE — Patient Instructions (Signed)

## 2014-02-04 NOTE — Patient Instructions (Signed)
[facility] Discharge Instructions for Patients Receiving Chemotherapy  Today you received the following chemotherapy agents Gemzar and Cisplatin.   To help prevent nausea and vomiting after your treatment, we encourage you to take your nausea medication as directed.    If you develop nausea and vomiting that is not controlled by your nausea medication, call the clinic.   BELOW ARE SYMPTOMS THAT SHOULD BE REPORTED IMMEDIATELY:  *FEVER GREATER THAN 100.5 F  *CHILLS WITH OR WITHOUT FEVER  NAUSEA AND VOMITING THAT IS NOT CONTROLLED WITH YOUR NAUSEA MEDICATION  *UNUSUAL SHORTNESS OF BREATH  *UNUSUAL BRUISING OR BLEEDING  TENDERNESS IN MOUTH AND THROAT WITH OR WITHOUT PRESENCE OF ULCERS  *URINARY PROBLEMS  *BOWEL PROBLEMS  UNUSUAL RASH Items with * indicate a potential emergency and should be followed up as soon as possible.  Feel free to call the clinic you have any questions or concerns. The clinic phone number is [phone].

## 2014-02-04 NOTE — Progress Notes (Addendum)
[facility] [phone]   Fax:(336) [address] [place] [address]  DIAGNOSIS: Stage IV (T1b, N2, M1b) non-small cell lung cancer, adenocarcinoma, presented with a right lower lobe lung mass as well as mediastinal, hilar, bilateral supraclavicular lymphadenopathy in addition to extensive liver metastases and retroperitoneal lymphadenopathy diagnosed in December of 2014.  MOLECULAR BIOMARKERS: [facility] ONE biomarker testing revealed TP53 E204*, KEAP1 R623f*25+, NFKBIA amplification, NKX2-1 amplification, SMARCA4 E463*. The patient was negative for EGFR and ALK gene translocation   PRIOR THERAPY: Systemic chemotherapy with carboplatin for AUC of 5 and Alimta 500 mg/M2 every 3 weeks, status post 3 cycles. First dose 09/02/2013.   CURRENT THERAPY: Systemic chemotherapy with cisplatin 100 mg/M2 on days 1 and gemcitabine 800 mg/M2 on days 1 and 8 every 3 weeks, first cycle on 11/12/2013. Status post 4 cycles. Cisplatin was decreased to 75 mg per meter squared starting with cycle #4.   CHEMOTHERAPY INTENT: palliative  CURRENT # OF CHEMOTHERAPY CYCLES: 5  CURRENT ANTIEMETICS: Zofran, dexamethasone and Compazine.  CURRENT SMOKING STATUS: former smoker  ORAL CHEMOTHERAPY AND CONSENT: None  CURRENT BISPHOSPHONATES USE: None  PAIN MANAGEMENT: 0/10  NARCOTICS INDUCED CONSTIPATION: None  LIVING WILL AND CODE STATUS: Full Code  INTERVAL HISTORY: Amber BANTZ63y.o. female returns to the clinic today for follow up visit accompanied by her husband. The patient is feeling fine today. She complains of decreased hearing. She's had nausea without vomiting with the exception of one episode of vomiting after her Port-A-Cath placement. She still has some left lower extremity swelling but overall this is better. She continues to have some fatigue. She denied having any fever or chills.  She has no significant weight loss or night sweats. She denied having any significant chest pain, shortness of breath, cough or hemoptysis.   MEDICAL HISTORY: Past Medical History  Diagnosis Date  . HTN (hypertension)   . Cancer     lung ca    ALLERGIES:  is allergic to hctz and plendil.  MEDICATIONS:  Current Outpatient Prescriptions  Medication Sig Dispense Refill  . amLODipine (NORVASC) 10 MG tablet Take 10 mg by mouth every morning.       .Marland Kitchenaspirin EC 81 MG tablet Take 81 mg by mouth every morning.       . calcium carbonate (TUMS - DOSED IN MG ELEMENTAL CALCIUM) 500 MG chewable tablet Chew 1 tablet by mouth 3 (three) times daily as needed for indigestion or heartburn.       . lidocaine-prilocaine (EMLA) cream Apply 1 application topically as needed. Apply to port 1 hour before chemo appointment  30 g  0  . lisinopril (PRINIVIL,ZESTRIL) 20 MG tablet Take 20 mg by mouth every morning.       . magnesium oxide (MAG-OX) 400 (241.3 MG) MG tablet Take 1 tablet (400 mg total) by mouth 2 (two) times daily.  60 tablet  1  . nebivolol (BYSTOLIC) 10 MG tablet Take 10 mg by mouth every morning.       .Marland KitchenoxyCODONE (ROXICODONE) 5 MG immediate release tablet Take 1 tablet (5 mg total) by mouth every 6 (six) hours as needed for severe pain.  60 tablet  0  . prochlorperazine (COMPAZINE) 10 MG tablet Take 1 tablet (10 mg total) by mouth every 6 (six) hours as needed for nausea or vomiting.  60 tablet  1   No current facility-administered  medications for this visit.    REVIEW OF SYSTEMS:  Constitutional: positive for fatigue Eyes: negative Ears, nose, mouth, throat, and face: positive for decreased hearing Respiratory: negative Cardiovascular: positive for lower extremity edema and left leg, improved Gastrointestinal: positive for nausea and vomiting Genitourinary:negative Integument/breast: negative Hematologic/lymphatic: negative Musculoskeletal:negative Neurological:  negative Behavioral/Psych: negative Endocrine: negative Allergic/Immunologic: negative   PHYSICAL EXAMINATION: General appearance: alert, cooperative and no distress Head: Normocephalic, without obvious abnormality, atraumatic Neck: no adenopathy, no JVD, supple, symmetrical, trachea midline and thyroid not enlarged, symmetric, no tenderness/mass/nodules Lymph nodes: Cervical, supraclavicular, and axillary nodes normal. Resp: clear to auscultation bilaterally Back: symmetric, no curvature. ROM normal. No CVA tenderness. Cardio: regular rate and rhythm, S1, S2 normal, no murmur, click, rub or gallop GI: soft, non-tender; bowel sounds normal; no masses,  no organomegaly Extremities: edema 1+ 2+ soft tissue edema left lower extremity Neurologic: Alert and oriented X 3, normal strength and tone. Normal symmetric reflexes. Normal coordination and gait Right anterior chest Port-A-Cath in place, incision well-healed no evidence of infection  ECOG PERFORMANCE STATUS: 1 - Symptomatic but completely ambulatory  Blood pressure 135/64, pulse 74, temperature 97.7 F (36.5 C), temperature source Oral, resp. rate 18, height 5' 2"  (1.575 m), weight 108 lb 8 oz (49.215 kg), SpO2 98.00%.  LABORATORY DATA: Lab Results  Component Value Date   WBC 2.9* 02/04/2014   HGB 8.5* 02/04/2014   HCT 25.6* 02/04/2014   MCV 101.2* 02/04/2014   PLT 170 02/04/2014      Chemistry      Component Value Date/Time   NA 138 02/04/2014 0811   K 4.3 02/04/2014 0811   CO2 25 02/04/2014 0811   BUN 13.7 02/04/2014 0811   CREATININE 0.7 02/04/2014 0811   CREATININE 0.80 08/21/2013 0800      Component Value Date/Time   CALCIUM 9.4 02/04/2014 0811       RADIOGRAPHIC STUDIES: Ct Chest W Contrast  01/12/2014   CLINICAL DATA:  Lung cancer.  EXAM: CT CHEST, ABDOMEN, AND PELVIS WITH CONTRAST  TECHNIQUE: Multidetector CT imaging of the chest, abdomen and pelvis was performed following the standard protocol during bolus  administration of intravenous contrast.  CONTRAST:  160m OMNIPAQUE IOHEXOL 300 MG/ML  SOLN  COMPARISON:  11/09/2013  FINDINGS: CT CHEST FINDINGS  There are small bilateral pleural effusions which appear decreased in volume from previous exam. Mild centrilobular emphysema identified. There is a right lower lobe peribronchovascular nodule which measures 1.6 x 1.6 cm, image 33/series 5. Previously this measured 2.8 x 2.2 cm. No new or enlarging pulmonary nodules or masses.  The heart size appears normal. No pericardial effusion. Sub- carinal lymph node measures 9 mm, image 27/series 2. Previously 1.2 cm. Index left hilar lymph node measures 0.5 cm, image 28/series 2. Previously 1.1 cm. The pre-vascular node measures 0.3 cm, image 23/series 2. Previously 1 cm.  No axillary or supraclavicular adenopathy. Review of the visualized osseous structures is negative for aggressive lytic or sclerotic bone lesion.  CT ABDOMEN AND PELVIS FINDINGS  Multi focal liver metastasis are again identified. The index lesion within the dome measures 4.6 x 4.9 cm, image 46/ series 2. Previously 7.7 x 6.9 cm. Index lesion in the lateral segment of left hepatic lobe measures 3.6 x 2.5 cm, image 56/ series 2. Previously 6.5 x 4.6 cm. The gallbladder appears normal. No biliary dilatation. Normal appearance of the pancreas. The spleen is unremarkable.  Previous left adrenal metastasis measures 1.3 x 0.5 cm, image 55/series 2. Previously 1.6  x 1.1 cm. Right kidney is normal. The left kidney is normal. The urinary bladder appears normal. Fibroid uterus is again noted. Exophytic fibroid arising from the right side of the uterus measures 5.7 x 5.9 cm, image 99/series 2. This is compared with 5.9 x 4.7 cm previously.  Calcified atherosclerotic disease involves the abdominal aorta. There is no aneurysm. Index periaortic lymph node measures 8 mm, image 69/series 2. Previously 1.3 cm. More distally, there is a 7 mm nodule, image 74/series 2. Previously 1  cm. Left common iliac lymph node measures 6 mm, image 85/series 2. Previously 1.2 cm.  The stomach is normal. The small bowel loops have a normal course and caliber without obstruction. Normal appearance of the colon.  Review of the visualized bony structures shows no aggressive lytic or sclerotic bone lesions. Degenerative disc disease is noted within the lumbar spine.  IMPRESSION: 1. Interval decrease in size of right lung lesion. 2. Improvement in multi focal hepatic metastasis. 3. Retroperitoneal adenopathy is improved from previous exam.   Electronically Signed   By: Kerby Moors M.D.   On: 01/12/2014 12:46   Ct Abdomen Pelvis W Contrast  01/12/2014   CLINICAL DATA:  Lung cancer.  EXAM: CT CHEST, ABDOMEN, AND PELVIS WITH CONTRAST  TECHNIQUE: Multidetector CT imaging of the chest, abdomen and pelvis was performed following the standard protocol during bolus administration of intravenous contrast.  CONTRAST:  128m OMNIPAQUE IOHEXOL 300 MG/ML  SOLN  COMPARISON:  11/09/2013  FINDINGS: CT CHEST FINDINGS  There are small bilateral pleural effusions which appear decreased in volume from previous exam. Mild centrilobular emphysema identified. There is a right lower lobe peribronchovascular nodule which measures 1.6 x 1.6 cm, image 33/series 5. Previously this measured 2.8 x 2.2 cm. No new or enlarging pulmonary nodules or masses.  The heart size appears normal. No pericardial effusion. Sub- carinal lymph node measures 9 mm, image 27/series 2. Previously 1.2 cm. Index left hilar lymph node measures 0.5 cm, image 28/series 2. Previously 1.1 cm. The pre-vascular node measures 0.3 cm, image 23/series 2. Previously 1 cm.  No axillary or supraclavicular adenopathy. Review of the visualized osseous structures is negative for aggressive lytic or sclerotic bone lesion.  CT ABDOMEN AND PELVIS FINDINGS  Multi focal liver metastasis are again identified. The index lesion within the dome measures 4.6 x 4.9 cm, image 46/  series 2. Previously 7.7 x 6.9 cm. Index lesion in the lateral segment of left hepatic lobe measures 3.6 x 2.5 cm, image 56/ series 2. Previously 6.5 x 4.6 cm. The gallbladder appears normal. No biliary dilatation. Normal appearance of the pancreas. The spleen is unremarkable.  Previous left adrenal metastasis measures 1.3 x 0.5 cm, image 55/series 2. Previously 1.6 x 1.1 cm. Right kidney is normal. The left kidney is normal. The urinary bladder appears normal. Fibroid uterus is again noted. Exophytic fibroid arising from the right side of the uterus measures 5.7 x 5.9 cm, image 99/series 2. This is compared with 5.9 x 4.7 cm previously.  Calcified atherosclerotic disease involves the abdominal aorta. There is no aneurysm. Index periaortic lymph node measures 8 mm, image 69/series 2. Previously 1.3 cm. More distally, there is a 7 mm nodule, image 74/series 2. Previously 1 cm. Left common iliac lymph node measures 6 mm, image 85/series 2. Previously 1.2 cm.  The stomach is normal. The small bowel loops have a normal course and caliber without obstruction. Normal appearance of the colon.  Review of the visualized bony structures shows  no aggressive lytic or sclerotic bone lesions. Degenerative disc disease is noted within the lumbar spine.  IMPRESSION: 1. Interval decrease in size of right lung lesion. 2. Improvement in multi focal hepatic metastasis. 3. Retroperitoneal adenopathy is improved from previous exam.   Electronically Signed   By: Kerby Moors M.D.   On: 01/12/2014 12:46   ASSESSMENT AND PLAN: This is a very pleasant 63 years old white female recently diagnosed with metastatic non-small cell lung cancer, adenocarcinoma presented with right lower lobe lung mass in addition to mediastinal and supraclavicular lymphadenopathy as well as diffuse metastatic liver lesions and retroperitoneal lymphadenopathy. She underwent 3 cycles of systemic chemotherapy with carboplatin and Alimta but unfortunately has  evidence for disease progression especially in the liver and new small left adrenal metastasis. She is currently undergoing second line chemotherapy with cisplatin and gemcitabine status post 4 cycles and tolerating it fairly well. Patient was discussed with him also seen by Dr. Julien Nordmann. Her recent restaging CT scan revealed improvement in her disease with interval decrease in the right lung lesion as well as improvement in the multi-hepatic lesions. She also has some decrease in her retroperitoneal adenopathy. She will proceed with cycle #5 of her dose reduced cisplatin and gemcitabine as scheduled, however the cisplatin will be further reduce the 60 mg per meter squared. We'll refer her to a [facility] your no some throat for further evaluation of her perceived decreased hearing as this is likely related to the cisplatin. She'll followup in 3 weeks prior to cycle #6.   She is status post right anterior chest Port-A-Cath placement  She was advised to call immediately if she has any concerning symptoms in the interval. The patient voices understanding of current disease status and treatment options and is in agreement with the current care plan.  All questions were answered. The patient knows to call the clinic with any problems, questions or concerns. We can certainly see the patient much sooner if necessary.  Carlton Adam, PA-C   Disclaimer: This note was dictated with voice recognition software. Similar sounding words can inadvertently be transcribed and may not be corrected upon review.   Carlton Adam, PA-C 02/04/2014  ADDENDUM: Hematology/Oncology Attending: I had a face to face encounter with the patient today. I recommended her care plan. This is a very pleasant 63 years old white female with stage IV metastatic adenocarcinoma of questionable lung primary versus hepatobiliary. She is currently undergoing systemic chemotherapy with cisplatin and gemcitabine status post 4 cycles  and tolerating her treatment fairly well except for increasing hearing problem. I will refer the patient to ENT for evaluation. I would also reduce her dose of cisplatin to 60 mg/M2starting from cycle #5. She would continue her chemotherapy today as scheduled. She would come back for follow up visit in 3 weeks with the start of cycle #6. She was advised to call immediately if she has any concerning symptoms in the interval.  Disclaimer: This note was dictated with voice recognition software. Similar sounding words can inadvertently be transcribed and may not be corrected upon review. Eilleen Kempf., MD 02/08/2014

## 2014-02-04 NOTE — Telephone Encounter (Signed)
Taslked to pt's husband she is aware of appt for june and  july

## 2014-02-04 NOTE — Progress Notes (Signed)
ANC 1.3. Ok to treat per Awilda Metro PA

## 2014-02-04 NOTE — Telephone Encounter (Signed)
Per staff message and POF I have scheduled appts.  JMW  

## 2014-02-04 NOTE — Telephone Encounter (Signed)
Gave pt appt for lab and MD , emailed Sharyn Lull regarding chemo , pt will see Dr Simeon Craft ENT @ [facility] and throa

## 2014-02-06 NOTE — Patient Instructions (Signed)
We will refer you to an ear nose and throat specialist for further evaluation of her decreased hearing The cisplatin dose will be further decreased as discussed Followup in 3 weeks, prior to your next cycle of chemotherapy

## 2014-02-08 ENCOUNTER — Telehealth: Payer: Self-pay | Admitting: Internal Medicine

## 2014-02-08 NOTE — Telephone Encounter (Signed)
Faxed pt medical records to [facility] °

## 2014-02-11 ENCOUNTER — Ambulatory Visit: Payer: 59

## 2014-02-11 ENCOUNTER — Ambulatory Visit (HOSPITAL_BASED_OUTPATIENT_CLINIC_OR_DEPARTMENT_OTHER): Payer: 59

## 2014-02-11 ENCOUNTER — Other Ambulatory Visit (HOSPITAL_BASED_OUTPATIENT_CLINIC_OR_DEPARTMENT_OTHER): Payer: 59

## 2014-02-11 VITALS — BP 139/74 | HR 72 | Temp 97.9°F | Resp 18

## 2014-02-11 DIAGNOSIS — C3490 Malignant neoplasm of unspecified part of unspecified bronchus or lung: Secondary | ICD-10-CM

## 2014-02-11 DIAGNOSIS — C3491 Malignant neoplasm of unspecified part of right bronchus or lung: Secondary | ICD-10-CM

## 2014-02-11 DIAGNOSIS — C7970 Secondary malignant neoplasm of unspecified adrenal gland: Secondary | ICD-10-CM

## 2014-02-11 DIAGNOSIS — C3430 Malignant neoplasm of lower lobe, unspecified bronchus or lung: Secondary | ICD-10-CM

## 2014-02-11 DIAGNOSIS — C787 Secondary malignant neoplasm of liver and intrahepatic bile duct: Secondary | ICD-10-CM

## 2014-02-11 DIAGNOSIS — Z5111 Encounter for antineoplastic chemotherapy: Secondary | ICD-10-CM

## 2014-02-11 DIAGNOSIS — Z95828 Presence of other vascular implants and grafts: Secondary | ICD-10-CM

## 2014-02-11 LAB — COMPREHENSIVE METABOLIC PANEL (CC13)
ALBUMIN: 3.3 g/dL — AB (ref 3.5–5.0)
ALT: 19 U/L (ref 0–55)
ANION GAP: 7 meq/L (ref 3–11)
AST: 18 U/L (ref 5–34)
Alkaline Phosphatase: 110 U/L (ref 40–150)
BUN: 11.5 mg/dL (ref 7.0–26.0)
CO2: 28 meq/L (ref 22–29)
Calcium: 9.3 mg/dL (ref 8.4–10.4)
Chloride: 102 mEq/L (ref 98–109)
Creatinine: 0.7 mg/dL (ref 0.6–1.1)
Glucose: 128 mg/dl (ref 70–140)
POTASSIUM: 4.5 meq/L (ref 3.5–5.1)
SODIUM: 137 meq/L (ref 136–145)
TOTAL PROTEIN: 6.2 g/dL — AB (ref 6.4–8.3)
Total Bilirubin: <0.2 mg/dL (ref 0.20–1.20)

## 2014-02-11 LAB — CBC WITH DIFFERENTIAL/PLATELET
BASO%: 0.7 % (ref 0.0–2.0)
Basophils Absolute: 0 10*3/uL (ref 0.0–0.1)
EOS ABS: 0 10*3/uL (ref 0.0–0.5)
EOS%: 0.1 % (ref 0.0–7.0)
HCT: 24.3 % — ABNORMAL LOW (ref 34.8–46.6)
HGB: 8.2 g/dL — ABNORMAL LOW (ref 11.6–15.9)
LYMPH#: 1.4 10*3/uL (ref 0.9–3.3)
LYMPH%: 45.1 % (ref 14.0–49.7)
MCH: 34.1 pg — ABNORMAL HIGH (ref 25.1–34.0)
MCHC: 33.6 g/dL (ref 31.5–36.0)
MCV: 101.5 fL — ABNORMAL HIGH (ref 79.5–101.0)
MONO#: 0.3 10*3/uL (ref 0.1–0.9)
MONO%: 8.1 % (ref 0.0–14.0)
NEUT%: 46 % (ref 38.4–76.8)
NEUTROS ABS: 1.4 10*3/uL — AB (ref 1.5–6.5)
Platelets: 210 10*3/uL (ref 145–400)
RBC: 2.39 10*6/uL — AB (ref 3.70–5.45)
RDW: 18.3 % — AB (ref 11.2–14.5)
WBC: 3.2 10*3/uL — ABNORMAL LOW (ref 3.9–10.3)

## 2014-02-11 LAB — MAGNESIUM (CC13): Magnesium: 1.8 mg/dl (ref 1.5–2.5)

## 2014-02-11 MED ORDER — SODIUM CHLORIDE 0.9 % IJ SOLN
10.0000 mL | INTRAMUSCULAR | Status: DC | PRN
  Administered 2014-02-11: 10 mL via INTRAVENOUS
  Filled 2014-02-11: qty 10

## 2014-02-11 MED ORDER — SODIUM CHLORIDE 0.9 % IV SOLN
800.0000 mg/m2 | Freq: Once | INTRAVENOUS | Status: AC
  Administered 2014-02-11: 1178 mg via INTRAVENOUS
  Filled 2014-02-11: qty 30.98

## 2014-02-11 MED ORDER — SODIUM CHLORIDE 0.9 % IJ SOLN
10.0000 mL | INTRAMUSCULAR | Status: DC | PRN
  Administered 2014-02-11: 10 mL
  Filled 2014-02-11: qty 10

## 2014-02-11 MED ORDER — SODIUM CHLORIDE 0.9 % IV SOLN
Freq: Once | INTRAVENOUS | Status: AC
  Administered 2014-02-11: 12:00:00 via INTRAVENOUS

## 2014-02-11 MED ORDER — PROCHLORPERAZINE MALEATE 10 MG PO TABS
ORAL_TABLET | ORAL | Status: AC
  Filled 2014-02-11: qty 1

## 2014-02-11 MED ORDER — HEPARIN SOD (PORK) LOCK FLUSH 100 UNIT/ML IV SOLN
500.0000 [IU] | Freq: Once | INTRAVENOUS | Status: AC | PRN
  Administered 2014-02-11: 500 [IU]
  Filled 2014-02-11: qty 5

## 2014-02-11 MED ORDER — PROCHLORPERAZINE MALEATE 10 MG PO TABS
10.0000 mg | ORAL_TABLET | Freq: Once | ORAL | Status: AC
  Administered 2014-02-11: 10 mg via ORAL

## 2014-02-11 NOTE — Patient Instructions (Signed)
[facility] Discharge Instructions for Patients Receiving Chemotherapy  Today you received the following chemotherapy agents: Gemzar  To help prevent nausea and vomiting after your treatment, we encourage you to take your nausea medication as prescribed.    If you develop nausea and vomiting that is not controlled by your nausea medication, call the clinic.   BELOW ARE SYMPTOMS THAT SHOULD BE REPORTED IMMEDIATELY:  *FEVER GREATER THAN 100.5 F  *CHILLS WITH OR WITHOUT FEVER  NAUSEA AND VOMITING THAT IS NOT CONTROLLED WITH YOUR NAUSEA MEDICATION  *UNUSUAL SHORTNESS OF BREATH  *UNUSUAL BRUISING OR BLEEDING  TENDERNESS IN MOUTH AND THROAT WITH OR WITHOUT PRESENCE OF ULCERS  *URINARY PROBLEMS  *BOWEL PROBLEMS  UNUSUAL RASH Items with * indicate a potential emergency and should be followed up as soon as possible.  Feel free to call the clinic you have any questions or concerns. The clinic phone number is [phone].

## 2014-02-11 NOTE — Patient Instructions (Signed)

## 2014-02-11 NOTE — Progress Notes (Signed)
Okay to treat today per Dr. Julien Nordmann with ANC 1.4 and Hgb 8.2. Cindi Carbon, RN

## 2014-02-24 ENCOUNTER — Other Ambulatory Visit: Payer: Self-pay | Admitting: Internal Medicine

## 2014-02-25 ENCOUNTER — Ambulatory Visit (HOSPITAL_BASED_OUTPATIENT_CLINIC_OR_DEPARTMENT_OTHER): Payer: 59 | Admitting: Internal Medicine

## 2014-02-25 ENCOUNTER — Ambulatory Visit: Payer: 59

## 2014-02-25 ENCOUNTER — Telehealth: Payer: Self-pay | Admitting: Internal Medicine

## 2014-02-25 ENCOUNTER — Encounter: Payer: Self-pay | Admitting: Internal Medicine

## 2014-02-25 ENCOUNTER — Ambulatory Visit (HOSPITAL_BASED_OUTPATIENT_CLINIC_OR_DEPARTMENT_OTHER): Payer: 59

## 2014-02-25 ENCOUNTER — Other Ambulatory Visit: Payer: 59

## 2014-02-25 VITALS — BP 133/61 | HR 74 | Temp 97.5°F | Resp 18 | Ht 62.0 in | Wt 109.6 lb

## 2014-02-25 DIAGNOSIS — C787 Secondary malignant neoplasm of liver and intrahepatic bile duct: Secondary | ICD-10-CM

## 2014-02-25 DIAGNOSIS — C3490 Malignant neoplasm of unspecified part of unspecified bronchus or lung: Secondary | ICD-10-CM

## 2014-02-25 DIAGNOSIS — C7970 Secondary malignant neoplasm of unspecified adrenal gland: Secondary | ICD-10-CM

## 2014-02-25 DIAGNOSIS — Z95828 Presence of other vascular implants and grafts: Secondary | ICD-10-CM

## 2014-02-25 DIAGNOSIS — C3430 Malignant neoplasm of lower lobe, unspecified bronchus or lung: Secondary | ICD-10-CM

## 2014-02-25 DIAGNOSIS — C3491 Malignant neoplasm of unspecified part of right bronchus or lung: Secondary | ICD-10-CM

## 2014-02-25 DIAGNOSIS — Z5111 Encounter for antineoplastic chemotherapy: Secondary | ICD-10-CM

## 2014-02-25 DIAGNOSIS — C50919 Malignant neoplasm of unspecified site of unspecified female breast: Secondary | ICD-10-CM

## 2014-02-25 DIAGNOSIS — C779 Secondary and unspecified malignant neoplasm of lymph node, unspecified: Secondary | ICD-10-CM

## 2014-02-25 LAB — COMPREHENSIVE METABOLIC PANEL (CC13)
ALBUMIN: 3.6 g/dL (ref 3.5–5.0)
ALT: 13 U/L (ref 0–55)
ANION GAP: 10 meq/L (ref 3–11)
AST: 18 U/L (ref 5–34)
Alkaline Phosphatase: 95 U/L (ref 40–150)
BUN: 9.1 mg/dL (ref 7.0–26.0)
CALCIUM: 9.6 mg/dL (ref 8.4–10.4)
CO2: 25 meq/L (ref 22–29)
Chloride: 102 mEq/L (ref 98–109)
Creatinine: 0.8 mg/dL (ref 0.6–1.1)
GLUCOSE: 95 mg/dL (ref 70–140)
POTASSIUM: 4.3 meq/L (ref 3.5–5.1)
SODIUM: 136 meq/L (ref 136–145)
TOTAL PROTEIN: 6.6 g/dL (ref 6.4–8.3)
Total Bilirubin: 0.25 mg/dL (ref 0.20–1.20)

## 2014-02-25 LAB — CBC WITH DIFFERENTIAL/PLATELET
BASO%: 0.6 % (ref 0.0–2.0)
BASOS ABS: 0 10*3/uL (ref 0.0–0.1)
EOS ABS: 0 10*3/uL (ref 0.0–0.5)
EOS%: 0.3 % (ref 0.0–7.0)
HCT: 26.3 % — ABNORMAL LOW (ref 34.8–46.6)
HGB: 8.8 g/dL — ABNORMAL LOW (ref 11.6–15.9)
LYMPH%: 30.7 % (ref 14.0–49.7)
MCH: 34.9 pg — ABNORMAL HIGH (ref 25.1–34.0)
MCHC: 33.5 g/dL (ref 31.5–36.0)
MCV: 104.2 fL — ABNORMAL HIGH (ref 79.5–101.0)
MONO#: 0.7 10*3/uL (ref 0.1–0.9)
MONO%: 16.3 % — ABNORMAL HIGH (ref 0.0–14.0)
NEUT%: 52.1 % (ref 38.4–76.8)
NEUTROS ABS: 2.2 10*3/uL (ref 1.5–6.5)
PLATELETS: 215 10*3/uL (ref 145–400)
RBC: 2.53 10*6/uL — ABNORMAL LOW (ref 3.70–5.45)
RDW: 19.3 % — AB (ref 11.2–14.5)
WBC: 4.1 10*3/uL (ref 3.9–10.3)
lymph#: 1.3 10*3/uL (ref 0.9–3.3)

## 2014-02-25 LAB — MAGNESIUM (CC13): Magnesium: 1.8 mg/dl (ref 1.5–2.5)

## 2014-02-25 MED ORDER — HEPARIN SOD (PORK) LOCK FLUSH 100 UNIT/ML IV SOLN
500.0000 [IU] | Freq: Once | INTRAVENOUS | Status: AC | PRN
  Administered 2014-02-25: 500 [IU]
  Filled 2014-02-25: qty 5

## 2014-02-25 MED ORDER — SODIUM CHLORIDE 0.9 % IJ SOLN
10.0000 mL | INTRAMUSCULAR | Status: DC | PRN
  Administered 2014-02-25: 10 mL via INTRAVENOUS
  Filled 2014-02-25: qty 10

## 2014-02-25 MED ORDER — PROCHLORPERAZINE MALEATE 10 MG PO TABS: 10.0000 mg | ORAL_TABLET | Freq: Four times a day (QID) | ORAL | Status: DC | PRN

## 2014-02-25 MED ORDER — SODIUM CHLORIDE 0.9 % IV SOLN
Freq: Once | INTRAVENOUS | Status: AC
  Administered 2014-02-25: 09:00:00 via INTRAVENOUS

## 2014-02-25 MED ORDER — SODIUM CHLORIDE 0.9 % IJ SOLN
10.0000 mL | INTRAMUSCULAR | Status: DC | PRN
  Administered 2014-02-25: 10 mL
  Filled 2014-02-25: qty 10

## 2014-02-25 MED ORDER — PALONOSETRON HCL INJECTION 0.25 MG/5ML
INTRAVENOUS | Status: AC
Start: 2014-02-25 — End: 2014-02-25
  Filled 2014-02-25: qty 5

## 2014-02-25 MED ORDER — SODIUM CHLORIDE 0.9 % IV SOLN
60.0000 mg/m2 | Freq: Once | INTRAVENOUS | Status: AC
  Administered 2014-02-25: 88 mg via INTRAVENOUS
  Filled 2014-02-25: qty 88

## 2014-02-25 MED ORDER — SODIUM CHLORIDE 0.9 % IV SOLN
150.0000 mg | Freq: Once | INTRAVENOUS | Status: AC
  Administered 2014-02-25: 150 mg via INTRAVENOUS
  Filled 2014-02-25: qty 5

## 2014-02-25 MED ORDER — DEXAMETHASONE SODIUM PHOSPHATE 20 MG/5ML IJ SOLN
INTRAMUSCULAR | Status: AC
  Filled 2014-02-25: qty 5

## 2014-02-25 MED ORDER — DEXAMETHASONE SODIUM PHOSPHATE 20 MG/5ML IJ SOLN
12.0000 mg | Freq: Once | INTRAMUSCULAR | Status: AC
  Administered 2014-02-25: 12 mg via INTRAVENOUS

## 2014-02-25 MED ORDER — POTASSIUM CHLORIDE 2 MEQ/ML IV SOLN
Freq: Once | INTRAVENOUS | Status: AC
  Administered 2014-02-25: 09:00:00 via INTRAVENOUS
  Filled 2014-02-25: qty 10

## 2014-02-25 MED ORDER — PALONOSETRON HCL INJECTION 0.25 MG/5ML
0.2500 mg | Freq: Once | INTRAVENOUS | Status: AC
  Administered 2014-02-25: 0.25 mg via INTRAVENOUS

## 2014-02-25 MED ORDER — SODIUM CHLORIDE 0.9 % IV SOLN
800.0000 mg/m2 | Freq: Once | INTRAVENOUS | Status: AC
  Administered 2014-02-25: 1178 mg via INTRAVENOUS
  Filled 2014-02-25: qty 30.98

## 2014-02-25 NOTE — Patient Instructions (Signed)
[facility] Discharge Instructions for Patients Receiving Chemotherapy  Today you received the following chemotherapy agents: Gemzar, Cisplatin  To help prevent nausea and vomiting after your treatment, we encourage you to take your nausea medication as prescribed by your physician.    If you develop nausea and vomiting that is not controlled by your nausea medication, call the clinic.   BELOW ARE SYMPTOMS THAT SHOULD BE REPORTED IMMEDIATELY:  *FEVER GREATER THAN 100.5 F  *CHILLS WITH OR WITHOUT FEVER  NAUSEA AND VOMITING THAT IS NOT CONTROLLED WITH YOUR NAUSEA MEDICATION  *UNUSUAL SHORTNESS OF BREATH  *UNUSUAL BRUISING OR BLEEDING  TENDERNESS IN MOUTH AND THROAT WITH OR WITHOUT PRESENCE OF ULCERS  *URINARY PROBLEMS  *BOWEL PROBLEMS  UNUSUAL RASH Items with * indicate a potential emergency and should be followed up as soon as possible.  Feel free to call the clinic you have any questions or concerns. The clinic phone number is [phone].

## 2014-02-25 NOTE — Telephone Encounter (Signed)
gv pt appt schedule for july/aug. [facility] will call w/ct - pt aware. lab on chemo days only.

## 2014-02-25 NOTE — Progress Notes (Signed)
[facility] Telephone:(336) [phone]   Fax:(336) [phone]  OFFICE PROGRESS NOTE  Mathews Argyle, MD [address] [place] [address]  DIAGNOSIS: Stage IV (T1b, N2, M1b) non-small cell lung cancer, adenocarcinoma, presented with a right lower lobe lung mass as well as mediastinal, hilar, bilateral supraclavicular lymphadenopathy in addition to extensive liver metastases and retroperitoneal lymphadenopathy diagnosed in December of 2014.  MOLECULAR BIOMARKERS: [facility] ONE biomarker testing revealed TP53 E204*, KEAP1 R618f*25+, NFKBIA amplification, NKX2-1 amplification, SMARCA4 E463*. The patient was negative for EGFR and ALK gene translocation   PRIOR THERAPY: Systemic chemotherapy with carboplatin for AUC of 5 and Alimta 500 mg/M2 every 3 weeks, status post 3 cycles. First dose 09/02/2013.   CURRENT THERAPY: Systemic chemotherapy with cisplatin 100 mg/M2 on days 1 and gemcitabine 800 mg/M2 on days 1 and 8 every 3 weeks, first cycle on 11/12/2013. Status post 5 cycles.   CHEMOTHERAPY INTENT: palliative  CURRENT # OF CHEMOTHERAPY CYCLES: 6  CURRENT ANTIEMETICS: Zofran, dexamethasone and Compazine.  CURRENT SMOKING STATUS: former smoker  ORAL CHEMOTHERAPY AND CONSENT: None  CURRENT BISPHOSPHONATES USE: None  PAIN MANAGEMENT: 0/10  NARCOTICS INDUCED CONSTIPATION: None  LIVING WILL AND CODE STATUS: Full Code  INTERVAL HISTORY: Amber DIMITRI63y.o. female returns to the clinic today for follow up visit accompanied by her husband. The patient is feeling fine today with no specific complaints except for fatigue that lasted for 3-4 days after the last chemotherapy. She was also seen by ENT for hearing deficit and she was found to have abnormality with a high pitched tone. She is currently on observation and has a followup appointment in August. The patient denied having any significant nausea or vomiting. She denied having any fever or chills.  She has no significant weight loss or night sweats. She denied having any significant chest pain, shortness of breath, cough or hemoptysis.   MEDICAL HISTORY: Past Medical History  Diagnosis Date  . HTN (hypertension)   . Cancer     lung ca    ALLERGIES:  is allergic to hctz and plendil.  MEDICATIONS:  Current Outpatient Prescriptions  Medication Sig Dispense Refill  . amLODipine (NORVASC) 10 MG tablet Take 10 mg by mouth every morning.       .Marland Kitchenaspirin EC 81 MG tablet Take 81 mg by mouth every morning.       . calcium carbonate (TUMS - DOSED IN MG ELEMENTAL CALCIUM) 500 MG chewable tablet Chew 1 tablet by mouth 3 (three) times daily as needed for indigestion or heartburn.       . lidocaine-prilocaine (EMLA) cream Apply 1 application topically as needed. Apply to port 1 hour before chemo appointment  30 g  0  . lisinopril (PRINIVIL,ZESTRIL) 20 MG tablet Take 20 mg by mouth every morning.       . magnesium oxide (MAG-OX) 400 (241.3 MG) MG tablet Take 1 tablet (400 mg total) by mouth 2 (two) times daily.  60 tablet  1  . nebivolol (BYSTOLIC) 10 MG tablet Take 10 mg by mouth every morning.       .Marland KitchenoxyCODONE (ROXICODONE) 5 MG immediate release tablet Take 1 tablet (5 mg total) by mouth every 6 (six) hours as needed for severe pain.  60 tablet  0  . prochlorperazine (COMPAZINE) 10 MG tablet Take 1 tablet (10 mg total) by mouth every 6 (six) hours as needed for nausea or vomiting.  60 tablet  1  No current facility-administered medications for this visit.    REVIEW OF SYSTEMS:  Constitutional: negative Eyes: negative Ears, nose, mouth, throat, and face: negative Respiratory: negative Cardiovascular: negative Gastrointestinal: positive for abdominal pain Genitourinary:negative Integument/breast: negative Hematologic/lymphatic: negative Musculoskeletal:negative Neurological: negative Behavioral/Psych: negative Endocrine: negative Allergic/Immunologic: negative   PHYSICAL  EXAMINATION: General appearance: alert, cooperative and no distress Head: Normocephalic, without obvious abnormality, atraumatic Neck: no adenopathy, no JVD, supple, symmetrical, trachea midline and thyroid not enlarged, symmetric, no tenderness/mass/nodules Lymph nodes: Cervical, supraclavicular, and axillary nodes normal. Resp: clear to auscultation bilaterally Back: symmetric, no curvature. ROM normal. No CVA tenderness. Cardio: regular rate and rhythm, S1, S2 normal, no murmur, click, rub or gallop GI: soft, non-tender; bowel sounds normal; no masses,  no organomegaly Extremities: extremities normal, atraumatic, no cyanosis or edema Neurologic: Alert and oriented X 3, normal strength and tone. Normal symmetric reflexes. Normal coordination and gait  ECOG PERFORMANCE STATUS: 1 - Symptomatic but completely ambulatory  Blood pressure 133/61, pulse 74, temperature 97.5 F (36.4 C), temperature source Oral, resp. rate 18, height 5' 2"  (1.575 m), weight 109 lb 9.6 oz (49.714 kg).  LABORATORY DATA: Lab Results  Component Value Date   WBC 4.1 02/25/2014   HGB 8.8* 02/25/2014   HCT 26.3* 02/25/2014   MCV 104.2* 02/25/2014   PLT 215 02/25/2014      Chemistry      Component Value Date/Time   NA 137 02/11/2014 1109   K 4.5 02/11/2014 1109   CO2 28 02/11/2014 1109   BUN 11.5 02/11/2014 1109   CREATININE 0.7 02/11/2014 1109   CREATININE 0.80 08/21/2013 0800      Component Value Date/Time   CALCIUM 9.3 02/11/2014 1109       RADIOGRAPHIC STUDIES:  ASSESSMENT AND PLAN: This is a very pleasant 63 years old white female recently diagnosed with metastatic non-small cell lung cancer, adenocarcinoma presented with right lower lobe lung mass in addition to mediastinal and supraclavicular lymphadenopathy as well as diffuse metastatic liver lesions and retroperitoneal lymphadenopathy. She underwent 3 cycles of systemic chemotherapy with carboplatin and Alimta but unfortunately has evidence for disease  progression especially in the liver and new small left adrenal metastasis. She is currently undergoing second line chemotherapy with cisplatin and gemcitabine status post 5 cycles and tolerating it fairly well patient after reducing the dose of cisplatin. I recommended for her to proceed with cycle #6 today as scheduled. She would come back for followup visit in 4 weeks with repeat CT scan of the chest, abdomen and pelvis for restaging of her disease. She was advised to call immediately if she has any concerning symptoms in the interval. The patient voices understanding of current disease status and treatment options and is in agreement with the current care plan.  All questions were answered. The patient knows to call the clinic with any problems, questions or concerns. We can certainly see the patient much sooner if necessary.  Disclaimer: This note was dictated with voice recognition software. Similar sounding words can inadvertently be transcribed and may not be corrected upon review.

## 2014-02-25 NOTE — Patient Instructions (Signed)

## 2014-03-04 ENCOUNTER — Other Ambulatory Visit (HOSPITAL_BASED_OUTPATIENT_CLINIC_OR_DEPARTMENT_OTHER): Payer: 59

## 2014-03-04 ENCOUNTER — Ambulatory Visit (HOSPITAL_BASED_OUTPATIENT_CLINIC_OR_DEPARTMENT_OTHER): Payer: 59

## 2014-03-04 ENCOUNTER — Ambulatory Visit: Payer: 59

## 2014-03-04 VITALS — BP 145/73 | HR 72 | Temp 97.5°F | Resp 20

## 2014-03-04 DIAGNOSIS — C3490 Malignant neoplasm of unspecified part of unspecified bronchus or lung: Secondary | ICD-10-CM

## 2014-03-04 DIAGNOSIS — C787 Secondary malignant neoplasm of liver and intrahepatic bile duct: Secondary | ICD-10-CM

## 2014-03-04 DIAGNOSIS — C7970 Secondary malignant neoplasm of unspecified adrenal gland: Secondary | ICD-10-CM

## 2014-03-04 DIAGNOSIS — C3430 Malignant neoplasm of lower lobe, unspecified bronchus or lung: Secondary | ICD-10-CM

## 2014-03-04 DIAGNOSIS — C3491 Malignant neoplasm of unspecified part of right bronchus or lung: Secondary | ICD-10-CM

## 2014-03-04 DIAGNOSIS — Z5111 Encounter for antineoplastic chemotherapy: Secondary | ICD-10-CM

## 2014-03-04 DIAGNOSIS — Z95828 Presence of other vascular implants and grafts: Secondary | ICD-10-CM

## 2014-03-04 LAB — CBC WITH DIFFERENTIAL/PLATELET
BASO%: 0.5 % (ref 0.0–2.0)
Basophils Absolute: 0 10*3/uL (ref 0.0–0.1)
EOS%: 0.2 % (ref 0.0–7.0)
Eosinophils Absolute: 0 10*3/uL (ref 0.0–0.5)
HCT: 25 % — ABNORMAL LOW (ref 34.8–46.6)
HGB: 8.4 g/dL — ABNORMAL LOW (ref 11.6–15.9)
LYMPH%: 50.1 % — ABNORMAL HIGH (ref 14.0–49.7)
MCH: 34.4 pg — ABNORMAL HIGH (ref 25.1–34.0)
MCHC: 33.4 g/dL (ref 31.5–36.0)
MCV: 102.9 fL — ABNORMAL HIGH (ref 79.5–101.0)
MONO#: 0.2 10*3/uL (ref 0.1–0.9)
MONO%: 7.3 % (ref 0.0–14.0)
NEUT#: 1.4 10*3/uL — ABNORMAL LOW (ref 1.5–6.5)
NEUT%: 41.9 % (ref 38.4–76.8)
Platelets: 219 10*3/uL (ref 145–400)
RBC: 2.43 10*6/uL — AB (ref 3.70–5.45)
RDW: 17.2 % — AB (ref 11.2–14.5)
WBC: 3.3 10*3/uL — ABNORMAL LOW (ref 3.9–10.3)
lymph#: 1.6 10*3/uL (ref 0.9–3.3)

## 2014-03-04 LAB — COMPREHENSIVE METABOLIC PANEL (CC13)
ALBUMIN: 3.5 g/dL (ref 3.5–5.0)
ALT: 30 U/L (ref 0–55)
ANION GAP: 7 meq/L (ref 3–11)
AST: 19 U/L (ref 5–34)
Alkaline Phosphatase: 104 U/L (ref 40–150)
BUN: 10.6 mg/dL (ref 7.0–26.0)
CALCIUM: 9.7 mg/dL (ref 8.4–10.4)
CHLORIDE: 101 meq/L (ref 98–109)
CO2: 30 meq/L — AB (ref 22–29)
Creatinine: 0.7 mg/dL (ref 0.6–1.1)
Glucose: 103 mg/dl (ref 70–140)
POTASSIUM: 4.5 meq/L (ref 3.5–5.1)
SODIUM: 138 meq/L (ref 136–145)
TOTAL PROTEIN: 6.6 g/dL (ref 6.4–8.3)
Total Bilirubin: <0.2 mg/dL (ref 0.20–1.20)

## 2014-03-04 LAB — MAGNESIUM (CC13): Magnesium: 1.6 mg/dl (ref 1.5–2.5)

## 2014-03-04 MED ORDER — SODIUM CHLORIDE 0.9 % IV SOLN
800.0000 mg/m2 | Freq: Once | INTRAVENOUS | Status: AC
  Administered 2014-03-04: 1178 mg via INTRAVENOUS
  Filled 2014-03-04: qty 30.98

## 2014-03-04 MED ORDER — SODIUM CHLORIDE 0.9 % IJ SOLN
10.0000 mL | INTRAMUSCULAR | Status: DC | PRN
  Administered 2014-03-04: 10 mL via INTRAVENOUS
  Filled 2014-03-04: qty 10

## 2014-03-04 MED ORDER — PROCHLORPERAZINE MALEATE 10 MG PO TABS
10.0000 mg | ORAL_TABLET | Freq: Once | ORAL | Status: AC
  Administered 2014-03-04: 10 mg via ORAL

## 2014-03-04 MED ORDER — PROCHLORPERAZINE MALEATE 10 MG PO TABS
ORAL_TABLET | ORAL | Status: AC
  Filled 2014-03-04: qty 1

## 2014-03-04 MED ORDER — SODIUM CHLORIDE 0.9 % IV SOLN
Freq: Once | INTRAVENOUS | Status: AC
  Administered 2014-03-04: 10:00:00 via INTRAVENOUS

## 2014-03-04 MED ORDER — SODIUM CHLORIDE 0.9 % IJ SOLN
10.0000 mL | INTRAMUSCULAR | Status: DC | PRN
  Administered 2014-03-04: 10 mL
  Filled 2014-03-04: qty 10

## 2014-03-04 MED ORDER — HEPARIN SOD (PORK) LOCK FLUSH 100 UNIT/ML IV SOLN
500.0000 [IU] | Freq: Once | INTRAVENOUS | Status: AC | PRN
  Administered 2014-03-04: 500 [IU]
  Filled 2014-03-04: qty 5

## 2014-03-04 NOTE — Patient Instructions (Signed)

## 2014-03-04 NOTE — Patient Instructions (Signed)
[facility] Discharge Instructions for Patients Receiving Chemotherapy  Today you received the following chemotherapy agents:  Gemzar  To help prevent nausea and vomiting after your treatment, we encourage you to take your nausea medication.   If you develop nausea and vomiting that is not controlled by your nausea medication, call the clinic.   BELOW ARE SYMPTOMS THAT SHOULD BE REPORTED IMMEDIATELY:  *FEVER GREATER THAN 100.5 F  *CHILLS WITH OR WITHOUT FEVER  NAUSEA AND VOMITING THAT IS NOT CONTROLLED WITH YOUR NAUSEA MEDICATION  *UNUSUAL SHORTNESS OF BREATH  *UNUSUAL BRUISING OR BLEEDING  TENDERNESS IN MOUTH AND THROAT WITH OR WITHOUT PRESENCE OF ULCERS  *URINARY PROBLEMS  *BOWEL PROBLEMS  UNUSUAL RASH Items with * indicate a potential emergency and should be followed up as soon as possible.  Feel free to call the clinic you have any questions or concerns. The clinic phone number is [phone].

## 2014-03-04 NOTE — Progress Notes (Signed)
Per Dr. Julien Nordmann OK to treat today despite ANC 1.4 & no need to wait on chemistries.  Repeated back & verified.

## 2014-03-23 ENCOUNTER — Encounter (HOSPITAL_COMMUNITY): Payer: Self-pay

## 2014-03-23 ENCOUNTER — Ambulatory Visit (HOSPITAL_COMMUNITY)
Admission: RE | Admit: 2014-03-23 | Discharge: 2014-03-23 | Disposition: A | Discharge status: Home / Self Care | Payer: 59 | Source: Ambulatory Visit | Attending: Internal Medicine | Admitting: Internal Medicine

## 2014-03-23 DIAGNOSIS — I700 Atherosclerosis of aorta: Secondary | ICD-10-CM | POA: Insufficient documentation

## 2014-03-23 DIAGNOSIS — J984 Other disorders of lung: Secondary | ICD-10-CM | POA: Diagnosis not present

## 2014-03-23 DIAGNOSIS — J9819 Other pulmonary collapse: Secondary | ICD-10-CM | POA: Diagnosis not present

## 2014-03-23 DIAGNOSIS — J438 Other emphysema: Secondary | ICD-10-CM | POA: Diagnosis not present

## 2014-03-23 DIAGNOSIS — C3490 Malignant neoplasm of unspecified part of unspecified bronchus or lung: Secondary | ICD-10-CM | POA: Insufficient documentation

## 2014-03-23 DIAGNOSIS — D259 Leiomyoma of uterus, unspecified: Secondary | ICD-10-CM | POA: Insufficient documentation

## 2014-03-23 DIAGNOSIS — R911 Solitary pulmonary nodule: Secondary | ICD-10-CM | POA: Insufficient documentation

## 2014-03-23 DIAGNOSIS — K7689 Other specified diseases of liver: Secondary | ICD-10-CM | POA: Diagnosis not present

## 2014-03-23 MED ORDER — IOHEXOL 300 MG/ML  SOLN
100.0000 mL | Freq: Once | INTRAMUSCULAR | Status: AC | PRN
  Administered 2014-03-23: 100 mL via INTRAVENOUS

## 2014-03-24 ENCOUNTER — Ambulatory Visit (HOSPITAL_COMMUNITY): Payer: 59

## 2014-03-25 ENCOUNTER — Ambulatory Visit: Payer: 59

## 2014-03-25 ENCOUNTER — Ambulatory Visit (HOSPITAL_BASED_OUTPATIENT_CLINIC_OR_DEPARTMENT_OTHER): Payer: 59 | Admitting: Internal Medicine

## 2014-03-25 ENCOUNTER — Other Ambulatory Visit (HOSPITAL_BASED_OUTPATIENT_CLINIC_OR_DEPARTMENT_OTHER): Payer: 59

## 2014-03-25 ENCOUNTER — Ambulatory Visit (HOSPITAL_BASED_OUTPATIENT_CLINIC_OR_DEPARTMENT_OTHER): Payer: 59

## 2014-03-25 ENCOUNTER — Other Ambulatory Visit: Payer: Self-pay | Admitting: *Deleted

## 2014-03-25 ENCOUNTER — Telehealth: Payer: Self-pay | Admitting: Internal Medicine

## 2014-03-25 ENCOUNTER — Encounter: Payer: Self-pay | Admitting: Internal Medicine

## 2014-03-25 VITALS — BP 140/69 | HR 72 | Temp 97.9°F | Resp 18 | Ht 62.0 in | Wt 109.4 lb

## 2014-03-25 DIAGNOSIS — C3430 Malignant neoplasm of lower lobe, unspecified bronchus or lung: Secondary | ICD-10-CM

## 2014-03-25 DIAGNOSIS — C7970 Secondary malignant neoplasm of unspecified adrenal gland: Secondary | ICD-10-CM

## 2014-03-25 DIAGNOSIS — C3491 Malignant neoplasm of unspecified part of right bronchus or lung: Secondary | ICD-10-CM

## 2014-03-25 DIAGNOSIS — C3490 Malignant neoplasm of unspecified part of unspecified bronchus or lung: Secondary | ICD-10-CM

## 2014-03-25 DIAGNOSIS — Z95828 Presence of other vascular implants and grafts: Secondary | ICD-10-CM

## 2014-03-25 DIAGNOSIS — Z5111 Encounter for antineoplastic chemotherapy: Secondary | ICD-10-CM

## 2014-03-25 DIAGNOSIS — C221 Intrahepatic bile duct carcinoma: Secondary | ICD-10-CM

## 2014-03-25 DIAGNOSIS — G609 Hereditary and idiopathic neuropathy, unspecified: Secondary | ICD-10-CM

## 2014-03-25 DIAGNOSIS — R599 Enlarged lymph nodes, unspecified: Secondary | ICD-10-CM

## 2014-03-25 DIAGNOSIS — C787 Secondary malignant neoplasm of liver and intrahepatic bile duct: Secondary | ICD-10-CM

## 2014-03-25 LAB — CBC WITH DIFFERENTIAL/PLATELET
BASO%: 0.9 % (ref 0.0–2.0)
Basophils Absolute: 0 10*3/uL (ref 0.0–0.1)
EOS%: 0.6 % (ref 0.0–7.0)
Eosinophils Absolute: 0 10*3/uL (ref 0.0–0.5)
HEMATOCRIT: 29.8 % — AB (ref 34.8–46.6)
HGB: 9.9 g/dL — ABNORMAL LOW (ref 11.6–15.9)
LYMPH#: 1.3 10*3/uL (ref 0.9–3.3)
LYMPH%: 26.7 % (ref 14.0–49.7)
MCH: 34 pg (ref 25.1–34.0)
MCHC: 33.2 g/dL (ref 31.5–36.0)
MCV: 102.3 fL — ABNORMAL HIGH (ref 79.5–101.0)
MONO#: 0.6 10*3/uL (ref 0.1–0.9)
MONO%: 13.4 % (ref 0.0–14.0)
NEUT#: 2.8 10*3/uL (ref 1.5–6.5)
NEUT%: 58.4 % (ref 38.4–76.8)
PLATELETS: 341 10*3/uL (ref 145–400)
RBC: 2.91 10*6/uL — ABNORMAL LOW (ref 3.70–5.45)
RDW: 16.5 % — ABNORMAL HIGH (ref 11.2–14.5)
WBC: 4.8 10*3/uL (ref 3.9–10.3)

## 2014-03-25 LAB — COMPREHENSIVE METABOLIC PANEL (CC13)
ALT: 11 U/L (ref 0–55)
AST: 21 U/L (ref 5–34)
Albumin: 3.6 g/dL (ref 3.5–5.0)
Alkaline Phosphatase: 92 U/L (ref 40–150)
Anion Gap: 8 mEq/L (ref 3–11)
BUN: 12.5 mg/dL (ref 7.0–26.0)
CO2: 27 mEq/L (ref 22–29)
CREATININE: 0.8 mg/dL (ref 0.6–1.1)
Calcium: 9.7 mg/dL (ref 8.4–10.4)
Chloride: 104 mEq/L (ref 98–109)
Glucose: 93 mg/dl (ref 70–140)
Potassium: 4.3 mEq/L (ref 3.5–5.1)
SODIUM: 139 meq/L (ref 136–145)
TOTAL PROTEIN: 6.6 g/dL (ref 6.4–8.3)
Total Bilirubin: 0.24 mg/dL (ref 0.20–1.20)

## 2014-03-25 LAB — MAGNESIUM (CC13): Magnesium: 2 mg/dl (ref 1.5–2.5)

## 2014-03-25 MED ORDER — GEMCITABINE HCL CHEMO INJECTION 1 GM/26.3ML
800.0000 mg/m2 | Freq: Once | INTRAVENOUS | Status: AC
  Administered 2014-03-25: 1178 mg via INTRAVENOUS
  Filled 2014-03-25: qty 30.98

## 2014-03-25 MED ORDER — POTASSIUM CHLORIDE 2 MEQ/ML IV SOLN
Freq: Once | INTRAVENOUS | Status: AC
  Administered 2014-03-25: 11:00:00 via INTRAVENOUS
  Filled 2014-03-25: qty 10

## 2014-03-25 MED ORDER — SODIUM CHLORIDE 0.9 % IJ SOLN
10.0000 mL | INTRAMUSCULAR | Status: DC | PRN
  Administered 2014-03-25: 10 mL via INTRAVENOUS
  Filled 2014-03-25: qty 10

## 2014-03-25 MED ORDER — DEXAMETHASONE SODIUM PHOSPHATE 20 MG/5ML IJ SOLN
INTRAMUSCULAR | Status: AC
  Filled 2014-03-25: qty 5

## 2014-03-25 MED ORDER — DEXAMETHASONE SODIUM PHOSPHATE 20 MG/5ML IJ SOLN
12.0000 mg | Freq: Once | INTRAMUSCULAR | Status: AC
  Administered 2014-03-25: 12 mg via INTRAVENOUS

## 2014-03-25 MED ORDER — PALONOSETRON HCL INJECTION 0.25 MG/5ML
0.2500 mg | Freq: Once | INTRAVENOUS | Status: AC
  Administered 2014-03-25: 0.25 mg via INTRAVENOUS

## 2014-03-25 MED ORDER — SODIUM CHLORIDE 0.9 % IV SOLN
60.0000 mg/m2 | Freq: Once | INTRAVENOUS | Status: AC
  Administered 2014-03-25: 88 mg via INTRAVENOUS
  Filled 2014-03-25: qty 88

## 2014-03-25 MED ORDER — LIDOCAINE-PRILOCAINE 2.5-2.5 % EX CREA: 1.0000 "application " | TOPICAL_CREAM | CUTANEOUS | Status: DC | PRN

## 2014-03-25 MED ORDER — SODIUM CHLORIDE 0.9 % IV SOLN
150.0000 mg | Freq: Once | INTRAVENOUS | Status: AC
  Administered 2014-03-25: 150 mg via INTRAVENOUS
  Filled 2014-03-25: qty 5

## 2014-03-25 MED ORDER — SODIUM CHLORIDE 0.9 % IJ SOLN
10.0000 mL | INTRAMUSCULAR | Status: DC | PRN
  Administered 2014-03-25: 10 mL
  Filled 2014-03-25: qty 10

## 2014-03-25 MED ORDER — HEPARIN SOD (PORK) LOCK FLUSH 100 UNIT/ML IV SOLN
500.0000 [IU] | Freq: Once | INTRAVENOUS | Status: AC | PRN
  Administered 2014-03-25: 500 [IU]
  Filled 2014-03-25: qty 5

## 2014-03-25 MED ORDER — PALONOSETRON HCL INJECTION 0.25 MG/5ML
INTRAVENOUS | Status: AC
  Filled 2014-03-25: qty 5

## 2014-03-25 MED ORDER — SODIUM CHLORIDE 0.9 % IV SOLN
Freq: Once | INTRAVENOUS | Status: AC
  Administered 2014-03-25: 13:00:00 via INTRAVENOUS

## 2014-03-25 NOTE — Patient Instructions (Signed)
Smoking Cessation Quitting smoking is important to your health and has many advantages. However, it is not always easy to quit since nicotine is a very addictive drug. Oftentimes, people try 3 times or more before being able to quit. This document explains the best ways for you to prepare to quit smoking. Quitting takes hard work and a lot of effort, but you can do it. ADVANTAGES OF QUITTING SMOKING  You will live longer, feel better, and live better.  Your body will feel the impact of quitting smoking almost immediately.  Within 20 minutes, blood pressure decreases. Your pulse returns to its normal level.  After 8 hours, carbon monoxide levels in the blood return to normal. Your oxygen level increases.  After 24 hours, the chance of having a heart attack starts to decrease. Your breath, hair, and body stop smelling like smoke.  After 48 hours, damaged nerve endings begin to recover. Your sense of taste and smell improve.  After 72 hours, the body is virtually free of nicotine. Your bronchial tubes relax and breathing becomes easier.  After 2 to 12 weeks, lungs can hold more air. Exercise becomes easier and circulation improves.  The risk of having a heart attack, stroke, cancer, or lung disease is greatly reduced.  After 1 year, the risk of coronary heart disease is cut in half.  After 5 years, the risk of stroke falls to the same as a nonsmoker.  After 10 years, the risk of lung cancer is cut in half and the risk of other cancers decreases significantly.  After 15 years, the risk of coronary heart disease drops, usually to the level of a nonsmoker.  If you are pregnant, quitting smoking will improve your chances of having a healthy baby.  The people you live with, especially any children, will be healthier.  You will have extra money to spend on things other than cigarettes. QUESTIONS TO THINK ABOUT BEFORE ATTEMPTING TO QUIT You may want to talk about your answers with your  health care provider.  Why do you want to quit?  If you tried to quit in the past, what helped and what did not?  What will be the most difficult situations for you after you quit? How will you plan to handle them?  Who can help you through the tough times? Your family? Friends? A health care provider?  What pleasures do you get from smoking? What ways can you still get pleasure if you quit? Here are some questions to ask your health care provider:  How can you help me to be successful at quitting?  What medicine do you think would be best for me and how should I take it?  What should I do if I need more help?  What is smoking withdrawal like? How can I get information on withdrawal? GET READY  Set a quit date.  Change your environment by getting rid of all cigarettes, ashtrays, matches, and lighters in your home, car, or work. Do not let people smoke in your home.  Review your past attempts to quit. Think about what worked and what did not. GET SUPPORT AND ENCOURAGEMENT You have a better chance of being successful if you have help. You can get support in many ways.  Tell your family, friends, and coworkers that you are going to quit and need their support. Ask them not to smoke around you.  Get individual, group, or telephone counseling and support. Programs are available at local hospitals and health centers. Call   your local health department for information about programs in your area.  Spiritual beliefs and practices may help some smokers quit.  Download a "quit meter" on your computer to keep track of quit statistics, such as how long you have gone without smoking, cigarettes not smoked, and money saved.  Get a self-help book about quitting smoking and staying off tobacco. LEARN NEW SKILLS AND BEHAVIORS  Distract yourself from urges to smoke. Talk to someone, go for a walk, or occupy your time with a task.  Change your normal routine. Take a different route to work.  Drink tea instead of coffee. Eat breakfast in a different place.  Reduce your stress. Take a hot bath, exercise, or read a book.  Plan something enjoyable to do every day. Reward yourself for not smoking.  Explore interactive web-based programs that specialize in helping you quit. GET MEDICINE AND USE IT CORRECTLY Medicines can help you stop smoking and decrease the urge to smoke. Combining medicine with the above behavioral methods and support can greatly increase your chances of successfully quitting smoking.  Nicotine replacement therapy helps deliver nicotine to your body without the negative effects and risks of smoking. Nicotine replacement therapy includes nicotine gum, lozenges, inhalers, nasal sprays, and skin patches. Some may be available over-the-counter and others require a prescription.  Antidepressant medicine helps people abstain from smoking, but how this works is unknown. This medicine is available by prescription.  Nicotinic receptor partial agonist medicine simulates the effect of nicotine in your brain. This medicine is available by prescription. Ask your health care provider for advice about which medicines to use and how to use them based on your health history. Your health care provider will tell you what side effects to look out for if you choose to be on a medicine or therapy. Carefully read the information on the package. Do not use any other product containing nicotine while using a nicotine replacement product.  RELAPSE OR DIFFICULT SITUATIONS Most relapses occur within the first 3 months after quitting. Do not be discouraged if you start smoking again. Remember, most people try several times before finally quitting. You may have symptoms of withdrawal because your body is used to nicotine. You may crave cigarettes, be irritable, feel very hungry, cough often, get headaches, or have difficulty concentrating. The withdrawal symptoms are only temporary. They are strongest  when you first quit, but they will go away within 10-14 days. To reduce the chances of relapse, try to:  Avoid drinking alcohol. Drinking lowers your chances of successfully quitting.  Reduce the amount of caffeine you consume. Once you quit smoking, the amount of caffeine in your body increases and can give you symptoms, such as a rapid heartbeat, sweating, and anxiety.  Avoid smokers because they can make you want to smoke.  Do not let weight gain distract you. Many smokers will gain weight when they quit, usually less than 10 pounds. Eat a healthy diet and stay active. You can always lose the weight gained after you quit.  Find ways to improve your mood other than smoking. FOR MORE INFORMATION  www.smokefree.gov  Document Released: 08/07/2001 Document Revised: 12/28/2013 Document Reviewed: 11/22/2011 Exit[employer] Patient Information 2015 [employer]. This information is not intended to replace advice given to you by your health care provider. Make sure you discuss any questions you have with your health care provider.  

## 2014-03-25 NOTE — Progress Notes (Signed)
Patient voided 400cc at 11am

## 2014-03-25 NOTE — Telephone Encounter (Signed)
Pt confirmed labs/ov per 07/30 POF, gave pt AVS....KJ

## 2014-03-25 NOTE — Patient Instructions (Signed)
[facility] Discharge Instructions for Patients Receiving Chemotherapy  Today you received the following chemotherapy agents gemzar and cisplatin.  To help prevent nausea and vomiting after your treatment, we encourage you to take your nausea medication compazine.   If you develop nausea and vomiting that is not controlled by your nausea medication, call the clinic.   BELOW ARE SYMPTOMS THAT SHOULD BE REPORTED IMMEDIATELY:  *FEVER GREATER THAN 100.5 F  *CHILLS WITH OR WITHOUT FEVER  NAUSEA AND VOMITING THAT IS NOT CONTROLLED WITH YOUR NAUSEA MEDICATION  *UNUSUAL SHORTNESS OF BREATH  *UNUSUAL BRUISING OR BLEEDING  TENDERNESS IN MOUTH AND THROAT WITH OR WITHOUT PRESENCE OF ULCERS  *URINARY PROBLEMS  *BOWEL PROBLEMS  UNUSUAL RASH Items with * indicate a potential emergency and should be followed up as soon as possible.  Feel free to call the clinic you have any questions or concerns. The clinic phone number is [phone].

## 2014-03-25 NOTE — Progress Notes (Signed)
[facility] [phone]   [phone]  OFFICE PROGRESS NOTE  Mathews Argyle, MD [address] [place] [address]  DIAGNOSIS: Stage IV (T1b, N2, M1b) non-small cell lung cancer, adenocarcinoma, presented with a right lower lobe lung mass as well as mediastinal, hilar, bilateral supraclavicular lymphadenopathy in addition to extensive liver metastases and retroperitoneal lymphadenopathy diagnosed in December of 2014.  MOLECULAR BIOMARKERS: [facility] ONE biomarker testing revealed TP53 E204*, KEAP1 R649f*25+, NFKBIA amplification, NKX2-1 amplification, SMARCA4 E463*. The patient was negative for EGFR and ALK gene translocation   PRIOR THERAPY: Systemic chemotherapy with carboplatin for AUC of 5 and Alimta 500 mg/M2 every 3 weeks, status post 3 cycles. First dose 09/02/2013.   CURRENT THERAPY: Systemic chemotherapy with cisplatin 60 mg/M2 on days 1 and gemcitabine 800 mg/M2 on days 1 and 8 every 3 weeks, first cycle on 11/12/2013. Status post 6 cycles.   CHEMOTHERAPY INTENT: palliative  CURRENT # OF CHEMOTHERAPY CYCLES: 7  CURRENT ANTIEMETICS: Zofran, dexamethasone and Compazine.  CURRENT SMOKING STATUS: former smoker  ORAL CHEMOTHERAPY AND CONSENT: None  CURRENT BISPHOSPHONATES USE: None  PAIN MANAGEMENT: 0/10  NARCOTICS INDUCED CONSTIPATION: None  LIVING WILL AND CODE STATUS: Full Code  INTERVAL HISTORY: JRIAH KEHOE63y.[id number]. female returns to the clinic today for follow up visit accompanied by her husband. The patient is feeling fine today with no specific complaints except for fatigue and mild peripheral neuropathy. She did not have any significant improvement or worsening in her hearing over the last few weeks. The patient denied having any significant nausea or vomiting. She denied having any fever or chills. She has no significant weight loss or night sweats. She denied having any significant chest pain, shortness  of breath, cough or hemoptysis. She tolerated the last cycle of her systemic chemotherapy with reduced dose cisplatin and gemcitabine fairly well. She had repeat CT scan of the chest, abdomen and pelvis performed recently and she is here for evaluation and discussion of her scan results.  MEDICAL HISTORY: Past Medical History  Diagnosis Date  . HTN (hypertension)   . lung ca dx'd 07/2013    ALLERGIES:  is allergic to hctz and plendil.  MEDICATIONS:  Current Outpatient Prescriptions  Medication Sig Dispense Refill  . amLODipine (NORVASC) 10 MG tablet Take 10 mg by mouth every morning.       .Marland Kitchenaspirin EC 81 MG tablet Take 81 mg by mouth every morning.       . calcium carbonate (TUMS - DOSED IN MG ELEMENTAL CALCIUM) 500 MG chewable tablet Chew 1 tablet by mouth 3 (three) times daily as needed for indigestion or heartburn.       . lidocaine-prilocaine (EMLA) cream Apply 1 application topically as needed. Apply to port 1 hour before chemo appointment  30 g  0  . lisinopril (PRINIVIL,ZESTRIL) 20 MG tablet Take 20 mg by mouth every morning.       . magnesium oxide (MAG-OX) 400 (241.3 MG) MG tablet Take 1 tablet (400 mg total) by mouth 2 (two) times daily.  60 tablet  1  . nebivolol (BYSTOLIC) 10 MG tablet Take 10 mg by mouth every morning.       .Marland KitchenoxyCODONE (ROXICODONE) 5 MG immediate release tablet Take 1 tablet (5 mg total) by mouth every 6 (six) hours as needed for severe pain.  60 tablet  0  . prochlorperazine (COMPAZINE) 10 MG tablet Take 1 tablet (10 mg total) by mouth  every 6 (six) hours as needed for nausea or vomiting.  60 tablet  1   No current facility-administered medications for this visit.    REVIEW OF SYSTEMS:  Constitutional: negative Eyes: negative Ears, nose, mouth, throat, and face: negative Respiratory: negative Cardiovascular: negative Gastrointestinal: positive for abdominal pain Genitourinary:negative Integument/breast: negative Hematologic/lymphatic:  negative Musculoskeletal:negative Neurological: negative Behavioral/Psych: negative Endocrine: negative Allergic/Immunologic: negative   PHYSICAL EXAMINATION: General appearance: alert, cooperative and no distress Head: Normocephalic, without obvious abnormality, atraumatic Neck: no adenopathy, no JVD, supple, symmetrical, trachea midline and thyroid not enlarged, symmetric, no tenderness/mass/nodules Lymph nodes: Cervical, supraclavicular, and axillary nodes normal. Resp: clear to auscultation bilaterally Back: symmetric, no curvature. ROM normal. No CVA tenderness. Cardio: regular rate and rhythm, S1, S2 normal, no murmur, click, rub or gallop GI: soft, non-tender; bowel sounds normal; no masses,  no organomegaly Extremities: extremities normal, atraumatic, no cyanosis or edema Neurologic: Alert and oriented X 3, normal strength and tone. Normal symmetric reflexes. Normal coordination and gait  ECOG PERFORMANCE STATUS: 1 - Symptomatic but completely ambulatory  Blood pressure 140/69, pulse 72, temperature 97.9 F (36.6 C), temperature source Oral, resp. rate 18, height _0  (1.575 m), weight 109 lb 6.4 oz (49.624 kg).  LABORATORY DATA: Lab Results  Component Value Date   WBC 4.8 03/25/2014   HGB 9.9* 03/25/2014   HCT 29.8* 03/25/2014   MCV 102.3* 03/25/2014   PLT 341 03/25/2014      Chemistry      Component Value Date/Time   NA 139 03/25/2014 0830   K 4.3 03/25/2014 0830   CO2 27 03/25/2014 0830   BUN 12.5 03/25/2014 0830   CREATININE 0.8 03/25/2014 0830   CREATININE 0.80 08/21/2013 0800      Component Value Date/Time   CALCIUM 9.7 03/25/2014 0830       RADIOGRAPHIC STUDIES: Ct Chest W Contrast  03/23/2014   CLINICAL DATA:  Lung cancer.  Chemotherapy and progress.  EXAM: CT CHEST, ABDOMEN, AND PELVIS WITH CONTRAST  TECHNIQUE: Multidetector CT imaging of the chest, abdomen and pelvis was performed following the standard protocol during bolus administration of intravenous  contrast.  CONTRAST:  137m OMNIPAQUE IOHEXOL 300 MG/ML  SOLN  COMPARISON:  01/12/2014 and PET-CT 08/26/2013  FINDINGS:   CT CHEST FINDINGS  The chest wall is unremarkable and stable. No breast masses are identified. There is small bilateral axillary lymph nodes bilaterally which are stable. No supraclavicular lymphadenopathy is identified. There were PET positive supraclavicular nodes bilaterally on the PET-CT. There is a 4.5 mm nodule noted on the left on image number 8. The Port-A-Cath is in place. No complicating features. The bony thorax is intact. No destructive bone lesions or spinal canal compromise.  The heart is normal in size. No pericardial effusion. No enlarged mediastinal or hilar lymph nodes. Stable small scattered nodes. The subcarinal node measures 9 mm which is stable. The left hilar node measures 5.5 mm which is stable. Minimal residual nodal tissue in the right hilum.  The right lower lobe lung lesion is slightly smaller measuring 14 x 13.5 mm on image number 34. It previously measured 16 x 15.5 mm. Smaller adjacent densities are stable. No new lung lesions are identified. No acute pulmonary findings. Stable emphysematous changes. New area of subpleural atelectasis noted in the right middle lobe. Stable small subpleural nodule near the left major fissure noted on image number 21. No pleural effusion. There is also a new discussed that was also stable subpleural nodule in the right middle  lobe on image number 39    CT ABDOMEN AND PELVIS FINDINGS  The hepatic lesions continue to regress. The larger lesion in the right hepatic lobe near the dome previously measured 4.9 x 4.6 cm and now measures 3.6 x 3.0 cm. The left hepatic lobe lesion previously measured 35.5 x 25.5 mm and now measures 24 x 17.5 mm. No new lesions. No biliary dilatation. The gallbladder is normal. No common bowel duct dilatation. The pancreas is normal and stable. The spleen is normal and stable. No discrete measurable adrenal  gland lesions. The left adrenal gland lesion has resolved. The kidneys are unremarkable.  The stomach, duodenum, small bowel and colon are grossly normal. No inflammatory changes, mass lesions or obstructive findings. No mesenteric or retroperitoneal mass or adenopathy. Small scattered retroperitoneal lymph nodes are stable. Stable atherosclerotic calcifications involving the aorta and branch vessels. No focal aneurysm or dissection.  Stable uterine fibroids. The bladder is normal. No pelvic mass or adenopathy. No free pelvic fluid collections. No inguinal mass or adenopathy.  The bony structures are unremarkable. No destructive bone lesions or spinal canal compromise.    IMPRESSION: Continued improved appearance of the chest, abdomen and pelvis with slight further regression of the right lower lobe pulmonary lesion, stable small mediastinal and hilar lymph nodes, regression of hepatic metastatic lesions, No residual or recurrent retroperitoneal adenopathy or adrenal gland lesions.   Electronically Signed   By: Kalman Jewels M.D.   On: 03/23/2014 09:41   ASSESSMENT AND PLAN: This is a very pleasant 63 years old white female recently diagnosed with metastatic non-small cell lung cancer, adenocarcinoma presented with right lower lobe lung mass in addition to mediastinal and supraclavicular lymphadenopathy as well as diffuse metastatic liver lesions and retroperitoneal lymphadenopathy. She underwent 3 cycles of systemic chemotherapy with carboplatin and Alimta but unfortunately has evidence for disease progression especially in the liver and new small left adrenal metastasis. She is currently undergoing second line chemotherapy with cisplatin and gemcitabine status post 6 cycles and tolerating it fairly well patient after reducing the dose of cisplatin. The recent CT scan of the chest, abdomen and pelvis showed continuous improvement in her disease in the chest, abdomen and pelvis. I discussed the scan  results and showed the images to the patient and her husband. I recommended for her to continue the same treatment with reduced dose cisplatin and gemcitabine. She will start cycle #7 today. She would come back for followup visit in 3 weeks with the next cycle of her treatment. She was advised to call immediately if she has any concerning symptoms in the interval. The patient voices understanding of current disease status and treatment options and is in agreement with the current care plan.  All questions were answered. The patient knows to call the clinic with any problems, questions or concerns. We can certainly see the patient much sooner if necessary.  Disclaimer: This note was dictated with voice recognition software. Similar sounding words can inadvertently be transcribed and may not be corrected upon review.

## 2014-04-01 ENCOUNTER — Ambulatory Visit: Payer: 59

## 2014-04-01 ENCOUNTER — Ambulatory Visit (HOSPITAL_BASED_OUTPATIENT_CLINIC_OR_DEPARTMENT_OTHER): Payer: 59

## 2014-04-01 ENCOUNTER — Other Ambulatory Visit (HOSPITAL_BASED_OUTPATIENT_CLINIC_OR_DEPARTMENT_OTHER): Payer: 59

## 2014-04-01 VITALS — BP 150/67 | HR 73 | Temp 97.8°F

## 2014-04-01 DIAGNOSIS — C7970 Secondary malignant neoplasm of unspecified adrenal gland: Secondary | ICD-10-CM

## 2014-04-01 DIAGNOSIS — Z95828 Presence of other vascular implants and grafts: Secondary | ICD-10-CM

## 2014-04-01 DIAGNOSIS — C3430 Malignant neoplasm of lower lobe, unspecified bronchus or lung: Secondary | ICD-10-CM

## 2014-04-01 DIAGNOSIS — C3490 Malignant neoplasm of unspecified part of unspecified bronchus or lung: Secondary | ICD-10-CM

## 2014-04-01 DIAGNOSIS — C787 Secondary malignant neoplasm of liver and intrahepatic bile duct: Secondary | ICD-10-CM

## 2014-04-01 DIAGNOSIS — C3491 Malignant neoplasm of unspecified part of right bronchus or lung: Secondary | ICD-10-CM

## 2014-04-01 DIAGNOSIS — C221 Intrahepatic bile duct carcinoma: Secondary | ICD-10-CM

## 2014-04-01 DIAGNOSIS — Z5111 Encounter for antineoplastic chemotherapy: Secondary | ICD-10-CM

## 2014-04-01 LAB — COMPREHENSIVE METABOLIC PANEL (CC13)
ALBUMIN: 3.5 g/dL (ref 3.5–5.0)
ALK PHOS: 92 U/L (ref 40–150)
ALT: 26 U/L (ref 0–55)
AST: 21 U/L (ref 5–34)
Anion Gap: 8 mEq/L (ref 3–11)
BUN: 12.4 mg/dL (ref 7.0–26.0)
CO2: 27 mEq/L (ref 22–29)
CREATININE: 0.8 mg/dL (ref 0.6–1.1)
Calcium: 9.5 mg/dL (ref 8.4–10.4)
Chloride: 101 mEq/L (ref 98–109)
GLUCOSE: 101 mg/dL (ref 70–140)
POTASSIUM: 4 meq/L (ref 3.5–5.1)
Sodium: 136 mEq/L (ref 136–145)
Total Bilirubin: 0.37 mg/dL (ref 0.20–1.20)
Total Protein: 6.6 g/dL (ref 6.4–8.3)

## 2014-04-01 LAB — CBC WITH DIFFERENTIAL/PLATELET
BASO%: 0.4 % (ref 0.0–2.0)
BASOS ABS: 0 10*3/uL (ref 0.0–0.1)
EOS ABS: 0 10*3/uL (ref 0.0–0.5)
EOS%: 0 % (ref 0.0–7.0)
HEMATOCRIT: 28.3 % — AB (ref 34.8–46.6)
HGB: 9.4 g/dL — ABNORMAL LOW (ref 11.6–15.9)
LYMPH%: 33.4 % (ref 14.0–49.7)
MCH: 33.6 pg (ref 25.1–34.0)
MCHC: 33.4 g/dL (ref 31.5–36.0)
MCV: 100.5 fL (ref 79.5–101.0)
MONO#: 0.2 10*3/uL (ref 0.1–0.9)
MONO%: 4.7 % (ref 0.0–14.0)
NEUT%: 61.5 % (ref 38.4–76.8)
NEUTROS ABS: 2.3 10*3/uL (ref 1.5–6.5)
PLATELETS: 141 10*3/uL — AB (ref 145–400)
RBC: 2.81 10*6/uL — ABNORMAL LOW (ref 3.70–5.45)
RDW: 15.5 % — ABNORMAL HIGH (ref 11.2–14.5)
WBC: 3.7 10*3/uL — ABNORMAL LOW (ref 3.9–10.3)
lymph#: 1.2 10*3/uL (ref 0.9–3.3)

## 2014-04-01 LAB — MAGNESIUM (CC13): MAGNESIUM: 1.6 mg/dL (ref 1.5–2.5)

## 2014-04-01 MED ORDER — HEPARIN SOD (PORK) LOCK FLUSH 100 UNIT/ML IV SOLN
500.0000 [IU] | Freq: Once | INTRAVENOUS | Status: AC | PRN
  Administered 2014-04-01: 500 [IU]
  Filled 2014-04-01: qty 5

## 2014-04-01 MED ORDER — SODIUM CHLORIDE 0.9 % IV SOLN
800.0000 mg/m2 | Freq: Once | INTRAVENOUS | Status: AC
  Administered 2014-04-01: 1178 mg via INTRAVENOUS
  Filled 2014-04-01: qty 30.98

## 2014-04-01 MED ORDER — SODIUM CHLORIDE 0.9 % IV SOLN
Freq: Once | INTRAVENOUS | Status: AC
  Administered 2014-04-01: 09:00:00 via INTRAVENOUS

## 2014-04-01 MED ORDER — SODIUM CHLORIDE 0.9 % IJ SOLN
10.0000 mL | INTRAMUSCULAR | Status: DC | PRN
  Administered 2014-04-01: 10 mL via INTRAVENOUS
  Filled 2014-04-01: qty 10

## 2014-04-01 MED ORDER — PROCHLORPERAZINE MALEATE 10 MG PO TABS
ORAL_TABLET | ORAL | Status: AC
  Filled 2014-04-01: qty 1

## 2014-04-01 MED ORDER — PROCHLORPERAZINE MALEATE 10 MG PO TABS
10.0000 mg | ORAL_TABLET | Freq: Once | ORAL | Status: AC
  Administered 2014-04-01: 10 mg via ORAL

## 2014-04-01 MED ORDER — SODIUM CHLORIDE 0.9 % IJ SOLN
10.0000 mL | INTRAMUSCULAR | Status: DC | PRN
  Administered 2014-04-01: 10 mL
  Filled 2014-04-01: qty 10

## 2014-04-01 NOTE — Patient Instructions (Signed)
[facility] Discharge Instructions for Patients Receiving Chemotherapy  Today you received the following chemotherapy agents: Gemzar.  To help prevent nausea and vomiting after your treatment, we encourage you to take your nausea medication compazine 10mg  every 6 hours as needed.   If you develop nausea and vomiting that is not controlled by your nausea medication, call the clinic.   BELOW ARE SYMPTOMS THAT SHOULD BE REPORTED IMMEDIATELY:  *FEVER GREATER THAN 100.5 F  *CHILLS WITH OR WITHOUT FEVER  NAUSEA AND VOMITING THAT IS NOT CONTROLLED WITH YOUR NAUSEA MEDICATION  *UNUSUAL SHORTNESS OF BREATH  *UNUSUAL BRUISING OR BLEEDING  TENDERNESS IN MOUTH AND THROAT WITH OR WITHOUT PRESENCE OF ULCERS  *URINARY PROBLEMS  *BOWEL PROBLEMS  UNUSUAL RASH Items with * indicate a potential emergency and should be followed up as soon as possible.  Feel free to call the clinic you have any questions or concerns. The clinic phone number is [phone].

## 2014-04-01 NOTE — Patient Instructions (Signed)

## 2014-04-15 ENCOUNTER — Telehealth: Payer: Self-pay | Admitting: Internal Medicine

## 2014-04-15 ENCOUNTER — Ambulatory Visit: Payer: 59

## 2014-04-15 ENCOUNTER — Other Ambulatory Visit (HOSPITAL_BASED_OUTPATIENT_CLINIC_OR_DEPARTMENT_OTHER): Payer: 59

## 2014-04-15 ENCOUNTER — Encounter: Payer: Self-pay | Admitting: Physician Assistant

## 2014-04-15 ENCOUNTER — Ambulatory Visit (HOSPITAL_BASED_OUTPATIENT_CLINIC_OR_DEPARTMENT_OTHER): Payer: 59

## 2014-04-15 ENCOUNTER — Ambulatory Visit (HOSPITAL_BASED_OUTPATIENT_CLINIC_OR_DEPARTMENT_OTHER): Payer: 59 | Admitting: Physician Assistant

## 2014-04-15 ENCOUNTER — Other Ambulatory Visit: Payer: 59

## 2014-04-15 VITALS — BP 139/60 | HR 73 | Temp 97.9°F | Resp 18 | Ht 62.0 in | Wt 109.7 lb

## 2014-04-15 DIAGNOSIS — R599 Enlarged lymph nodes, unspecified: Secondary | ICD-10-CM

## 2014-04-15 DIAGNOSIS — C3430 Malignant neoplasm of lower lobe, unspecified bronchus or lung: Secondary | ICD-10-CM

## 2014-04-15 DIAGNOSIS — C3490 Malignant neoplasm of unspecified part of unspecified bronchus or lung: Secondary | ICD-10-CM

## 2014-04-15 DIAGNOSIS — C787 Secondary malignant neoplasm of liver and intrahepatic bile duct: Secondary | ICD-10-CM

## 2014-04-15 DIAGNOSIS — Z5111 Encounter for antineoplastic chemotherapy: Secondary | ICD-10-CM

## 2014-04-15 DIAGNOSIS — C3491 Malignant neoplasm of unspecified part of right bronchus or lung: Secondary | ICD-10-CM

## 2014-04-15 DIAGNOSIS — Z95828 Presence of other vascular implants and grafts: Secondary | ICD-10-CM

## 2014-04-15 DIAGNOSIS — C7970 Secondary malignant neoplasm of unspecified adrenal gland: Secondary | ICD-10-CM

## 2014-04-15 DIAGNOSIS — G609 Hereditary and idiopathic neuropathy, unspecified: Secondary | ICD-10-CM

## 2014-04-15 DIAGNOSIS — H9190 Unspecified hearing loss, unspecified ear: Secondary | ICD-10-CM

## 2014-04-15 DIAGNOSIS — C221 Intrahepatic bile duct carcinoma: Secondary | ICD-10-CM

## 2014-04-15 LAB — CBC WITH DIFFERENTIAL/PLATELET
BASO%: 0.4 % (ref 0.0–2.0)
Basophils Absolute: 0 10*3/uL (ref 0.0–0.1)
EOS%: 0.4 % (ref 0.0–7.0)
Eosinophils Absolute: 0 10*3/uL (ref 0.0–0.5)
HEMATOCRIT: 27.7 % — AB (ref 34.8–46.6)
HGB: 9.2 g/dL — ABNORMAL LOW (ref 11.6–15.9)
LYMPH#: 1.1 10*3/uL (ref 0.9–3.3)
LYMPH%: 33.6 % (ref 14.0–49.7)
MCH: 33.4 pg (ref 25.1–34.0)
MCHC: 33.1 g/dL (ref 31.5–36.0)
MCV: 101.1 fL — ABNORMAL HIGH (ref 79.5–101.0)
MONO#: 0.6 10*3/uL (ref 0.1–0.9)
MONO%: 17.8 % — ABNORMAL HIGH (ref 0.0–14.0)
NEUT#: 1.6 10*3/uL (ref 1.5–6.5)
NEUT%: 47.8 % (ref 38.4–76.8)
Platelets: 183 10*3/uL (ref 145–400)
RBC: 2.74 10*6/uL — AB (ref 3.70–5.45)
RDW: 16.9 % — ABNORMAL HIGH (ref 11.2–14.5)
WBC: 3.4 10*3/uL — AB (ref 3.9–10.3)

## 2014-04-15 LAB — COMPREHENSIVE METABOLIC PANEL (CC13)
ALT: 10 U/L (ref 0–55)
ANION GAP: 11 meq/L (ref 3–11)
AST: 19 U/L (ref 5–34)
Albumin: 3.6 g/dL (ref 3.5–5.0)
Alkaline Phosphatase: 95 U/L (ref 40–150)
BUN: 13 mg/dL (ref 7.0–26.0)
CALCIUM: 10 mg/dL (ref 8.4–10.4)
CHLORIDE: 102 meq/L (ref 98–109)
CO2: 25 meq/L (ref 22–29)
Creatinine: 1 mg/dL (ref 0.6–1.1)
Glucose: 114 mg/dl (ref 70–140)
Potassium: 4.1 mEq/L (ref 3.5–5.1)
SODIUM: 137 meq/L (ref 136–145)
TOTAL PROTEIN: 6.8 g/dL (ref 6.4–8.3)
Total Bilirubin: <0.2 mg/dL (ref 0.20–1.20)

## 2014-04-15 LAB — MAGNESIUM (CC13): Magnesium: 1.8 mg/dl (ref 1.5–2.5)

## 2014-04-15 MED ORDER — SODIUM CHLORIDE 0.9 % IV SOLN
Freq: Once | INTRAVENOUS | Status: AC
  Administered 2014-04-15: 11:00:00 via INTRAVENOUS

## 2014-04-15 MED ORDER — PALONOSETRON HCL INJECTION 0.25 MG/5ML
INTRAVENOUS | Status: AC
  Filled 2014-04-15: qty 5

## 2014-04-15 MED ORDER — HEPARIN SOD (PORK) LOCK FLUSH 100 UNIT/ML IV SOLN
500.0000 [IU] | Freq: Once | INTRAVENOUS | Status: AC | PRN
  Administered 2014-04-15: 500 [IU]
  Filled 2014-04-15: qty 5

## 2014-04-15 MED ORDER — SODIUM CHLORIDE 0.9 % IJ SOLN
10.0000 mL | INTRAMUSCULAR | Status: DC | PRN
  Administered 2014-04-15: 10 mL
  Filled 2014-04-15: qty 10

## 2014-04-15 MED ORDER — SODIUM CHLORIDE 0.9 % IJ SOLN
10.0000 mL | INTRAMUSCULAR | Status: DC | PRN
  Administered 2014-04-15: 10 mL via INTRAVENOUS
  Filled 2014-04-15: qty 10

## 2014-04-15 MED ORDER — SODIUM CHLORIDE 0.9 % IV SOLN
60.0000 mg/m2 | Freq: Once | INTRAVENOUS | Status: AC
  Administered 2014-04-15: 88 mg via INTRAVENOUS
  Filled 2014-04-15: qty 88

## 2014-04-15 MED ORDER — DEXAMETHASONE SODIUM PHOSPHATE 20 MG/5ML IJ SOLN
INTRAMUSCULAR | Status: AC
  Filled 2014-04-15: qty 5

## 2014-04-15 MED ORDER — PALONOSETRON HCL INJECTION 0.25 MG/5ML
0.2500 mg | Freq: Once | INTRAVENOUS | Status: AC
  Administered 2014-04-15: 0.25 mg via INTRAVENOUS

## 2014-04-15 MED ORDER — DEXAMETHASONE SODIUM PHOSPHATE 20 MG/5ML IJ SOLN
12.0000 mg | Freq: Once | INTRAMUSCULAR | Status: AC
  Administered 2014-04-15: 12 mg via INTRAVENOUS

## 2014-04-15 MED ORDER — SODIUM CHLORIDE 0.9 % IV SOLN
800.0000 mg/m2 | Freq: Once | INTRAVENOUS | Status: AC
  Administered 2014-04-15: 1178 mg via INTRAVENOUS
  Filled 2014-04-15: qty 31

## 2014-04-15 MED ORDER — SODIUM CHLORIDE 0.9 % IV SOLN
150.0000 mg | Freq: Once | INTRAVENOUS | Status: AC
  Administered 2014-04-15: 150 mg via INTRAVENOUS
  Filled 2014-04-15: qty 5

## 2014-04-15 MED ORDER — POTASSIUM CHLORIDE 2 MEQ/ML IV SOLN
Freq: Once | INTRAVENOUS | Status: AC
  Administered 2014-04-15: 11:00:00 via INTRAVENOUS
  Filled 2014-04-15: qty 10

## 2014-04-15 NOTE — Patient Instructions (Signed)
[facility] Discharge Instructions for Patients Receiving Chemotherapy  Today you received the following chemotherapy agents Gemcitabine.   To help prevent nausea and vomiting after your treatment, we encourage you to take your nausea medication as directed.    If you develop nausea and vomiting that is not controlled by your nausea medication, call the clinic.   BELOW ARE SYMPTOMS THAT SHOULD BE REPORTED IMMEDIATELY:  *FEVER GREATER THAN 100.5 F  *CHILLS WITH OR WITHOUT FEVER  NAUSEA AND VOMITING THAT IS NOT CONTROLLED WITH YOUR NAUSEA MEDICATION  *UNUSUAL SHORTNESS OF BREATH  *UNUSUAL BRUISING OR BLEEDING  TENDERNESS IN MOUTH AND THROAT WITH OR WITHOUT PRESENCE OF ULCERS  *URINARY PROBLEMS  *BOWEL PROBLEMS  UNUSUAL RASH Items with * indicate a potential emergency and should be followed up as soon as possible.  Feel free to call the clinic you have any questions or concerns. The clinic phone number is [phone].

## 2014-04-15 NOTE — Telephone Encounter (Signed)
gv and printed appt sched and avs for pt for Aug, Sept and OCT.Marland KitchenMarland KitchenMarland Kitchen

## 2014-04-15 NOTE — Progress Notes (Addendum)
Amber Hayden [phone]   Fax:(336) [address]  [place] [address]  DIAGNOSIS: Stage IV (T1b, N2, M1b) non-small cell lung cancer, adenocarcinoma, presented with a right lower lobe lung mass as well as mediastinal, hilar, bilateral supraclavicular lymphadenopathy in addition to extensive liver metastases and retroperitoneal lymphadenopathy diagnosed in December of 2014.  MOLECULAR BIOMARKERS: [facility] ONE biomarker testing revealed TP53 E204*, KEAP1 R682f*25+, NFKBIA amplification, NKX2-1 amplification, SMARCA4 E463*. The patient was negative for EGFR and ALK gene translocation   PRIOR THERAPY: Systemic chemotherapy with carboplatin for AUC of 5 and Alimta 500 mg/M2 every 3 weeks, status post 3 cycles. First dose 09/02/2013.   CURRENT THERAPY: Systemic chemotherapy with cisplatin 60 mg/M2 on days 1 and gemcitabine 800 mg/M2 on days 1 and 8 every 3 weeks, first cycle on 11/12/2013. Status post 6 cycles.   CHEMOTHERAPY INTENT: palliative  CURRENT # OF CHEMOTHERAPY CYCLES: 7  CURRENT ANTIEMETICS: Zofran, dexamethasone and Compazine.  CURRENT SMOKING STATUS: former smoker  ORAL CHEMOTHERAPY AND CONSENT: None  CURRENT BISPHOSPHONATES USE: None  PAIN MANAGEMENT: 0/10  NARCOTICS INDUCED CONSTIPATION: None  LIVING WILL AND CODE STATUS: Full Code  INTERVAL HISTORY: Amber REXRODE63y.o. female returns to the clinic today for follow up visit accompanied by her husband. The patient is feeling fine today with no specific complaints except for fatigue and mild peripheral neuropathy. She did not have any significant improvement or worsening in her hearing. The patient denied having any significant nausea or vomiting. She denied having any fever or chills. She has no significant weight loss or night sweats. She denied having any significant chest pain, shortness of breath, cough  or hemoptysis. She tolerated the last cycle of her systemic chemotherapy with reduced dose cisplatin and gemcitabine fairly well. She reports that she has a cataract and is supposed to see a specialist regarding surgery. It may require taking a break from chemotherapy.  MEDICAL HISTORY: Past Medical History  Diagnosis Date  . HTN (hypertension)   . lung ca dx'd 07/2013    ALLERGIES:  is allergic to hctz and plendil.  MEDICATIONS:  Current Outpatient Prescriptions  Medication Sig Dispense Refill  . amLODipine (NORVASC) 10 MG tablet Take 10 mg by mouth every morning.       .Marland Kitchenaspirin EC 81 MG tablet Take 81 mg by mouth every morning.       . calcium carbonate (TUMS - DOSED IN MG ELEMENTAL CALCIUM) 500 MG chewable tablet Chew 1 tablet by mouth 3 (three) times daily as needed for indigestion or heartburn.       . lidocaine-prilocaine (EMLA) cream Apply 1 application topically as needed. Apply to port 1 hour before chemo appointment  30 g  0  . lisinopril (PRINIVIL,ZESTRIL) 20 MG tablet Take 20 mg by mouth every morning.       . magnesium oxide (MAG-OX) 400 (241.3 MG) MG tablet Take 1 tablet (400 mg total) by mouth 2 (two) times daily.  60 tablet  1  . nebivolol (BYSTOLIC) 10 MG tablet Take 10 mg by mouth every morning.       .Marland KitchenoxyCODONE (ROXICODONE) 5 MG immediate release tablet Take 1 tablet (5 mg total) by mouth every 6 (six) hours as needed for severe pain.  60 tablet  0  . prochlorperazine (COMPAZINE) 10 MG tablet Take 1 tablet (10 mg total) by mouth every 6 (six) hours as  needed for nausea or vomiting.  60 tablet  1   No current facility-administered medications for this visit.   Facility-Administered Medications Ordered in Other Visits  Medication Dose Route Frequency Provider Last Rate Last Dose  . sodium chloride 0.9 % injection 10 mL  10 mL Intracatheter PRN Curt Bears, MD   10 mL at 04/15/14 1546    REVIEW OF SYSTEMS:  Constitutional: negative Eyes: negative Ears, nose,  mouth, throat, and face: negative Respiratory: negative Cardiovascular: negative Gastrointestinal: positive for abdominal pain Genitourinary:negative Integument/breast: negative Hematologic/lymphatic: negative Musculoskeletal:negative Neurological: negative Behavioral/Psych: negative Endocrine: negative Allergic/Immunologic: negative   PHYSICAL EXAMINATION: General appearance: alert, cooperative and no distress Head: Normocephalic, without obvious abnormality, atraumatic Neck: no adenopathy, no JVD, supple, symmetrical, trachea midline and thyroid not enlarged, symmetric, no tenderness/mass/nodules Lymph nodes: Cervical, supraclavicular, and axillary nodes normal. Resp: clear to auscultation bilaterally Back: symmetric, no curvature. ROM normal. No CVA tenderness. Cardio: regular rate and rhythm, S1, S2 normal, no murmur, click, rub or gallop GI: soft, non-tender; bowel sounds normal; no masses,  no organomegaly Extremities: extremities normal, atraumatic, no cyanosis or edema Neurologic: Alert and oriented X 3, normal strength and tone. Normal symmetric reflexes. Normal coordination and gait  ECOG PERFORMANCE STATUS: 1 - Symptomatic but completely ambulatory  Blood pressure 139/60, pulse 73, temperature 97.9 F (36.6 C), temperature source Oral, resp. rate 18, height _0  (1.575 m), weight 109 lb 11.2 oz (49.76 kg), SpO2 97.00%.  LABORATORY DATA: Lab Results  Component Value Date   WBC 3.4* 04/15/2014   HGB 9.2* 04/15/2014   HCT 27.7* 04/15/2014   MCV 101.1* 04/15/2014   PLT 183 04/15/2014      Chemistry      Component Value Date/Time   NA 137 04/15/2014 0855   K 4.1 04/15/2014 0855   CO2 25 04/15/2014 0855   BUN 13.0 04/15/2014 0855   CREATININE 1.0 04/15/2014 0855   CREATININE 0.80 08/21/2013 0800      Component Value Date/Time   CALCIUM 10.0 04/15/2014 0855       RADIOGRAPHIC STUDIES: Ct Chest W Contrast  03/23/2014   CLINICAL DATA:  Lung cancer.  Chemotherapy and  progress.  EXAM: CT CHEST, ABDOMEN, AND PELVIS WITH CONTRAST  TECHNIQUE: Multidetector CT imaging of the chest, abdomen and pelvis was performed following the standard protocol during bolus administration of intravenous contrast.  CONTRAST:  138m OMNIPAQUE IOHEXOL 300 MG/ML  SOLN  COMPARISON:  01/12/2014 and PET-CT 08/26/2013  FINDINGS:   CT CHEST FINDINGS  The chest wall is unremarkable and stable. No breast masses are identified. There is small bilateral axillary lymph nodes bilaterally which are stable. No supraclavicular lymphadenopathy is identified. There were PET positive supraclavicular nodes bilaterally on the PET-CT. There is a 4.5 mm nodule noted on the left on image number 8. The Port-A-Cath is in place. No complicating features. The bony thorax is intact. No destructive bone lesions or spinal canal compromise.  The heart is normal in size. No pericardial effusion. No enlarged mediastinal or hilar lymph nodes. Stable small scattered nodes. The subcarinal node measures 9 mm which is stable. The left hilar node measures 5.5 mm which is stable. Minimal residual nodal tissue in the right hilum.  The right lower lobe lung lesion is slightly smaller measuring 14 x 13.5 mm on image number 34. It previously measured 16 x 15.5 mm. Smaller adjacent densities are stable. No new lung lesions are identified. No acute pulmonary findings. Stable emphysematous changes. New area of subpleural atelectasis  noted in the right middle lobe. Stable small subpleural nodule near the left major fissure noted on image number 21. No pleural effusion. There is also a new discussed that was also stable subpleural nodule in the right middle lobe on image number 39    CT ABDOMEN AND PELVIS FINDINGS  The hepatic lesions continue to regress. The larger lesion in the right hepatic lobe near the dome previously measured 4.9 x 4.6 cm and now measures 3.6 x 3.0 cm. The left hepatic lobe lesion previously measured 35.5 x 25.5 mm and now  measures 24 x 17.5 mm. No new lesions. No biliary dilatation. The gallbladder is normal. No common bowel duct dilatation. The pancreas is normal and stable. The spleen is normal and stable. No discrete measurable adrenal gland lesions. The left adrenal gland lesion has resolved. The kidneys are unremarkable.  The stomach, duodenum, small bowel and colon are grossly normal. No inflammatory changes, mass lesions or obstructive findings. No mesenteric or retroperitoneal mass or adenopathy. Small scattered retroperitoneal lymph nodes are stable. Stable atherosclerotic calcifications involving the aorta and branch vessels. No focal aneurysm or dissection.  Stable uterine fibroids. The bladder is normal. No pelvic mass or adenopathy. No free pelvic fluid collections. No inguinal mass or adenopathy.  The bony structures are unremarkable. No destructive bone lesions or spinal canal compromise.    IMPRESSION: Continued improved appearance of the chest, abdomen and pelvis with slight further regression of the right lower lobe pulmonary lesion, stable small mediastinal and hilar lymph nodes, regression of hepatic metastatic lesions, No residual or recurrent retroperitoneal adenopathy or adrenal gland lesions.   Electronically Signed   By: Kalman Jewels M.D.   On: 03/23/2014 09:41   ASSESSMENT AND PLAN: This is a very pleasant 63 years old white female recently diagnosed with metastatic non-small cell lung cancer, adenocarcinoma presented with right lower lobe lung mass in addition to mediastinal and supraclavicular lymphadenopathy as well as diffuse metastatic liver lesions and retroperitoneal lymphadenopathy. She underwent 3 cycles of systemic chemotherapy with carboplatin and Alimta but unfortunately has evidence for disease progression especially in the liver and new small left adrenal metastasis. She is currently undergoing second line chemotherapy with cisplatin and gemcitabine status post 6 cycles and tolerating  it fairly well patient after reducing the dose of cisplatin. The recent CT scan of the chest, abdomen and pelvis showed continuous improvement in her disease in the chest, abdomen and pelvis. Patient was discussed with also seen by Dr. Julien Nordmann. She will continue the same treatment with reduced dose cisplatin and gemcitabine. She will start cycle #8 today. She will followup visit in 3 weeks with the next cycle of her treatment. As she is beginning improvement in her disease we have asked her to postpone any surgery for the time being She was advised to call immediately if she has any concerning symptoms in the interval. The patient voices understanding of current disease status and treatment options and is in agreement with the current care plan.  All questions were answered. The patient knows to call the clinic with any problems, questions or concerns. We can certainly see the patient much sooner if necessary.  Carlton Adam PA-C  ADDENDUM: Hematology/Oncology Attending: I had a face to face encounter with the patient. I recommended her care plan. This is a very pleasant 63 years old white female with  Metastatic adenocarcinoma of questionable lung versus biliary tract primary. She is currently on treatment with cisplatin and gemcitabine status post 7 cycles.  She has significant improvement in her disease with this treatment regimen. She is tolerating her treatment fairly well except for mild peripheral neuropathy and some hearing deficit. There is no deterioration in her hearing deficit at this point. She is followed by ENT. I recommended for her to proceed with cycle #8 today as scheduled with reduced dose cisplatin and gemcitabine. She would come back for followup visit in 3 weeks with the next cycle of her treatment. She was advised to call immediately if she has any concerning symptoms in the interval.  Disclaimer: This note was dictated with voice recognition software. Similar sounding  words can inadvertently be transcribed and may not be corrected upon review. Eilleen Kempf., MD 04/19/2014

## 2014-04-15 NOTE — Patient Instructions (Signed)

## 2014-04-17 NOTE — Patient Instructions (Signed)
Continue labs and chemotherapy as scheduled Followup in 3 weeks prior to this her next scheduled cycle of chemotherapy

## 2014-04-22 ENCOUNTER — Other Ambulatory Visit (HOSPITAL_BASED_OUTPATIENT_CLINIC_OR_DEPARTMENT_OTHER): Payer: 59

## 2014-04-22 ENCOUNTER — Ambulatory Visit (HOSPITAL_BASED_OUTPATIENT_CLINIC_OR_DEPARTMENT_OTHER): Payer: 59

## 2014-04-22 ENCOUNTER — Ambulatory Visit: Payer: 59

## 2014-04-22 ENCOUNTER — Ambulatory Visit: Payer: 59 | Admitting: Physician Assistant

## 2014-04-22 ENCOUNTER — Other Ambulatory Visit: Payer: 59

## 2014-04-22 VITALS — BP 126/72 | HR 66 | Temp 97.7°F | Resp 18

## 2014-04-22 DIAGNOSIS — Z95828 Presence of other vascular implants and grafts: Secondary | ICD-10-CM

## 2014-04-22 DIAGNOSIS — Z5111 Encounter for antineoplastic chemotherapy: Secondary | ICD-10-CM

## 2014-04-22 DIAGNOSIS — C3430 Malignant neoplasm of lower lobe, unspecified bronchus or lung: Secondary | ICD-10-CM

## 2014-04-22 DIAGNOSIS — C221 Intrahepatic bile duct carcinoma: Secondary | ICD-10-CM

## 2014-04-22 DIAGNOSIS — C3491 Malignant neoplasm of unspecified part of right bronchus or lung: Secondary | ICD-10-CM

## 2014-04-22 DIAGNOSIS — C787 Secondary malignant neoplasm of liver and intrahepatic bile duct: Secondary | ICD-10-CM

## 2014-04-22 DIAGNOSIS — C7970 Secondary malignant neoplasm of unspecified adrenal gland: Secondary | ICD-10-CM

## 2014-04-22 LAB — CBC WITH DIFFERENTIAL/PLATELET
BASO%: 0.7 % (ref 0.0–2.0)
Basophils Absolute: 0 10*3/uL (ref 0.0–0.1)
EOS%: 0.1 % (ref 0.0–7.0)
Eosinophils Absolute: 0 10*3/uL (ref 0.0–0.5)
HCT: 26.3 % — ABNORMAL LOW (ref 34.8–46.6)
HGB: 8.8 g/dL — ABNORMAL LOW (ref 11.6–15.9)
LYMPH#: 1.5 10*3/uL (ref 0.9–3.3)
LYMPH%: 46.3 % (ref 14.0–49.7)
MCH: 33.3 pg (ref 25.1–34.0)
MCHC: 33.5 g/dL (ref 31.5–36.0)
MCV: 99.3 fL (ref 79.5–101.0)
MONO#: 0.2 10*3/uL (ref 0.1–0.9)
MONO%: 5.5 % (ref 0.0–14.0)
NEUT#: 1.5 10*3/uL (ref 1.5–6.5)
NEUT%: 47.4 % (ref 38.4–76.8)
Platelets: 184 10*3/uL (ref 145–400)
RBC: 2.65 10*6/uL — ABNORMAL LOW (ref 3.70–5.45)
RDW: 16.1 % — ABNORMAL HIGH (ref 11.2–14.5)
WBC: 3.2 10*3/uL — ABNORMAL LOW (ref 3.9–10.3)

## 2014-04-22 LAB — COMPREHENSIVE METABOLIC PANEL (CC13)
ALBUMIN: 3.5 g/dL (ref 3.5–5.0)
ALK PHOS: 96 U/L (ref 40–150)
ALT: 31 U/L (ref 0–55)
AST: 23 U/L (ref 5–34)
Anion Gap: 8 mEq/L (ref 3–11)
BILIRUBIN TOTAL: 0.29 mg/dL (ref 0.20–1.20)
BUN: 14.6 mg/dL (ref 7.0–26.0)
CO2: 27 mEq/L (ref 22–29)
Calcium: 9.3 mg/dL (ref 8.4–10.4)
Chloride: 101 mEq/L (ref 98–109)
Creatinine: 0.8 mg/dL (ref 0.6–1.1)
Glucose: 100 mg/dl (ref 70–140)
Potassium: 4.3 mEq/L (ref 3.5–5.1)
Sodium: 136 mEq/L (ref 136–145)
Total Protein: 6.5 g/dL (ref 6.4–8.3)

## 2014-04-22 LAB — MAGNESIUM (CC13): MAGNESIUM: 1.5 mg/dL (ref 1.5–2.5)

## 2014-04-22 MED ORDER — PROCHLORPERAZINE MALEATE 10 MG PO TABS
ORAL_TABLET | ORAL | Status: AC
  Filled 2014-04-22: qty 1

## 2014-04-22 MED ORDER — SODIUM CHLORIDE 0.9 % IV SOLN
Freq: Once | INTRAVENOUS | Status: AC
  Administered 2014-04-22: 10:00:00 via INTRAVENOUS

## 2014-04-22 MED ORDER — HEPARIN SOD (PORK) LOCK FLUSH 100 UNIT/ML IV SOLN
500.0000 [IU] | Freq: Once | INTRAVENOUS | Status: AC | PRN
  Administered 2014-04-22: 500 [IU]
  Filled 2014-04-22: qty 5

## 2014-04-22 MED ORDER — SODIUM CHLORIDE 0.9 % IV SOLN
800.0000 mg/m2 | Freq: Once | INTRAVENOUS | Status: AC
  Administered 2014-04-22: 1178 mg via INTRAVENOUS
  Filled 2014-04-22: qty 30.98

## 2014-04-22 MED ORDER — SODIUM CHLORIDE 0.9 % IJ SOLN
10.0000 mL | INTRAMUSCULAR | Status: DC | PRN
  Administered 2014-04-22: 10 mL
  Filled 2014-04-22: qty 10

## 2014-04-22 MED ORDER — PROCHLORPERAZINE MALEATE 10 MG PO TABS
10.0000 mg | ORAL_TABLET | Freq: Once | ORAL | Status: AC
  Administered 2014-04-22: 10 mg via ORAL

## 2014-04-22 MED ORDER — SODIUM CHLORIDE 0.9 % IJ SOLN
10.0000 mL | INTRAMUSCULAR | Status: DC | PRN
  Administered 2014-04-22: 10 mL via INTRAVENOUS
  Filled 2014-04-22: qty 10

## 2014-04-22 NOTE — Patient Instructions (Signed)
[facility] Discharge Instructions for Patients Receiving Chemotherapy  Today you received the following chemotherapy agent: Gemzar   To help prevent nausea and vomiting after your treatment, we encourage you to take your nausea medication as prescribed.    If you develop nausea and vomiting that is not controlled by your nausea medication, call the clinic.   BELOW ARE SYMPTOMS THAT SHOULD BE REPORTED IMMEDIATELY:  *FEVER GREATER THAN 100.5 F  *CHILLS WITH OR WITHOUT FEVER  NAUSEA AND VOMITING THAT IS NOT CONTROLLED WITH YOUR NAUSEA MEDICATION  *UNUSUAL SHORTNESS OF BREATH  *UNUSUAL BRUISING OR BLEEDING  TENDERNESS IN MOUTH AND THROAT WITH OR WITHOUT PRESENCE OF ULCERS  *URINARY PROBLEMS  *BOWEL PROBLEMS  UNUSUAL RASH Items with * indicate a potential emergency and should be followed up as soon as possible.  Feel free to call the clinic you have any questions or concerns. The clinic phone number is [phone].

## 2014-04-22 NOTE — Patient Instructions (Signed)

## 2014-05-06 ENCOUNTER — Other Ambulatory Visit: Payer: 59

## 2014-05-06 ENCOUNTER — Encounter: Payer: Self-pay | Admitting: Physician Assistant

## 2014-05-06 ENCOUNTER — Ambulatory Visit: Payer: 59

## 2014-05-06 ENCOUNTER — Ambulatory Visit (HOSPITAL_BASED_OUTPATIENT_CLINIC_OR_DEPARTMENT_OTHER): Payer: 59 | Admitting: Physician Assistant

## 2014-05-06 ENCOUNTER — Ambulatory Visit (HOSPITAL_BASED_OUTPATIENT_CLINIC_OR_DEPARTMENT_OTHER): Payer: 59

## 2014-05-06 ENCOUNTER — Other Ambulatory Visit (HOSPITAL_BASED_OUTPATIENT_CLINIC_OR_DEPARTMENT_OTHER): Payer: 59

## 2014-05-06 VITALS — BP 138/67 | HR 75 | Temp 98.2°F | Resp 18 | Ht 62.0 in | Wt 109.3 lb

## 2014-05-06 DIAGNOSIS — Z5111 Encounter for antineoplastic chemotherapy: Secondary | ICD-10-CM

## 2014-05-06 DIAGNOSIS — C7970 Secondary malignant neoplasm of unspecified adrenal gland: Secondary | ICD-10-CM

## 2014-05-06 DIAGNOSIS — T451X5A Adverse effect of antineoplastic and immunosuppressive drugs, initial encounter: Secondary | ICD-10-CM

## 2014-05-06 DIAGNOSIS — C3430 Malignant neoplasm of lower lobe, unspecified bronchus or lung: Secondary | ICD-10-CM

## 2014-05-06 DIAGNOSIS — C787 Secondary malignant neoplasm of liver and intrahepatic bile duct: Secondary | ICD-10-CM

## 2014-05-06 DIAGNOSIS — Z95828 Presence of other vascular implants and grafts: Secondary | ICD-10-CM

## 2014-05-06 DIAGNOSIS — R112 Nausea with vomiting, unspecified: Secondary | ICD-10-CM

## 2014-05-06 DIAGNOSIS — C3490 Malignant neoplasm of unspecified part of unspecified bronchus or lung: Secondary | ICD-10-CM

## 2014-05-06 DIAGNOSIS — G609 Hereditary and idiopathic neuropathy, unspecified: Secondary | ICD-10-CM

## 2014-05-06 DIAGNOSIS — C3491 Malignant neoplasm of unspecified part of right bronchus or lung: Secondary | ICD-10-CM

## 2014-05-06 DIAGNOSIS — D6481 Anemia due to antineoplastic chemotherapy: Secondary | ICD-10-CM

## 2014-05-06 DIAGNOSIS — C221 Intrahepatic bile duct carcinoma: Secondary | ICD-10-CM

## 2014-05-06 LAB — COMPREHENSIVE METABOLIC PANEL (CC13)
ALT: 10 U/L (ref 0–55)
ANION GAP: 9 meq/L (ref 3–11)
AST: 16 U/L (ref 5–34)
Albumin: 3.5 g/dL (ref 3.5–5.0)
Alkaline Phosphatase: 97 U/L (ref 40–150)
BUN: 8.5 mg/dL (ref 7.0–26.0)
CALCIUM: 9.6 mg/dL (ref 8.4–10.4)
CHLORIDE: 104 meq/L (ref 98–109)
CO2: 25 meq/L (ref 22–29)
Creatinine: 0.9 mg/dL (ref 0.6–1.1)
Glucose: 92 mg/dl (ref 70–140)
Potassium: 4 mEq/L (ref 3.5–5.1)
SODIUM: 137 meq/L (ref 136–145)
TOTAL PROTEIN: 6.6 g/dL (ref 6.4–8.3)
Total Bilirubin: <0.2 mg/dL (ref 0.20–1.20)

## 2014-05-06 LAB — CBC WITH DIFFERENTIAL/PLATELET
BASO%: 0.5 % (ref 0.0–2.0)
Basophils Absolute: 0 10*3/uL (ref 0.0–0.1)
EOS ABS: 0 10*3/uL (ref 0.0–0.5)
EOS%: 0.4 % (ref 0.0–7.0)
HCT: 24.6 % — ABNORMAL LOW (ref 34.8–46.6)
HGB: 8.1 g/dL — ABNORMAL LOW (ref 11.6–15.9)
LYMPH#: 1.2 10*3/uL (ref 0.9–3.3)
LYMPH%: 39.7 % (ref 14.0–49.7)
MCH: 33.1 pg (ref 25.1–34.0)
MCHC: 33 g/dL (ref 31.5–36.0)
MCV: 100.3 fL (ref 79.5–101.0)
MONO#: 0.5 10*3/uL (ref 0.1–0.9)
MONO%: 15.6 % — ABNORMAL HIGH (ref 0.0–14.0)
NEUT%: 43.8 % (ref 38.4–76.8)
NEUTROS ABS: 1.3 10*3/uL — AB (ref 1.5–6.5)
Platelets: 152 10*3/uL (ref 145–400)
RBC: 2.45 10*6/uL — AB (ref 3.70–5.45)
RDW: 17.2 % — ABNORMAL HIGH (ref 11.2–14.5)
WBC: 3 10*3/uL — AB (ref 3.9–10.3)

## 2014-05-06 LAB — MAGNESIUM (CC13): Magnesium: 1.8 mg/dl (ref 1.5–2.5)

## 2014-05-06 MED ORDER — POTASSIUM CHLORIDE 2 MEQ/ML IV SOLN
Freq: Once | INTRAVENOUS | Status: AC
  Administered 2014-05-06: 11:00:00 via INTRAVENOUS
  Filled 2014-05-06: qty 10

## 2014-05-06 MED ORDER — PALONOSETRON HCL INJECTION 0.25 MG/5ML
INTRAVENOUS | Status: AC
  Filled 2014-05-06: qty 5

## 2014-05-06 MED ORDER — DEXAMETHASONE SODIUM PHOSPHATE 20 MG/5ML IJ SOLN
INTRAMUSCULAR | Status: AC
  Filled 2014-05-06: qty 5

## 2014-05-06 MED ORDER — CISPLATIN CHEMO INJECTION 100MG/100ML
60.0000 mg/m2 | Freq: Once | INTRAVENOUS | Status: AC
  Administered 2014-05-06: 88 mg via INTRAVENOUS
  Filled 2014-05-06: qty 88

## 2014-05-06 MED ORDER — SODIUM CHLORIDE 0.9 % IV SOLN
150.0000 mg | Freq: Once | INTRAVENOUS | Status: AC
  Administered 2014-05-06: 150 mg via INTRAVENOUS
  Filled 2014-05-06: qty 5

## 2014-05-06 MED ORDER — HEPARIN SOD (PORK) LOCK FLUSH 100 UNIT/ML IV SOLN
500.0000 [IU] | Freq: Once | INTRAVENOUS | Status: AC | PRN
  Administered 2014-05-06: 500 [IU]
  Filled 2014-05-06: qty 5

## 2014-05-06 MED ORDER — PALONOSETRON HCL INJECTION 0.25 MG/5ML
0.2500 mg | Freq: Once | INTRAVENOUS | Status: AC
Start: 2014-05-06 — End: 2014-05-06
  Administered 2014-05-06: 0.25 mg via INTRAVENOUS

## 2014-05-06 MED ORDER — DEXAMETHASONE SODIUM PHOSPHATE 20 MG/5ML IJ SOLN
12.0000 mg | Freq: Once | INTRAMUSCULAR | Status: AC
  Administered 2014-05-06: 12 mg via INTRAVENOUS

## 2014-05-06 MED ORDER — SODIUM CHLORIDE 0.9 % IJ SOLN
10.0000 mL | INTRAMUSCULAR | Status: DC | PRN
  Administered 2014-05-06: 10 mL via INTRAVENOUS
  Filled 2014-05-06: qty 10

## 2014-05-06 MED ORDER — SODIUM CHLORIDE 0.9 % IJ SOLN
10.0000 mL | INTRAMUSCULAR | Status: DC | PRN
  Administered 2014-05-06: 10 mL
  Filled 2014-05-06: qty 10

## 2014-05-06 MED ORDER — GEMCITABINE HCL CHEMO INJECTION 1 GM/26.3ML
800.0000 mg/m2 | Freq: Once | INTRAVENOUS | Status: AC
  Administered 2014-05-06: 1178 mg via INTRAVENOUS
  Filled 2014-05-06: qty 30.98

## 2014-05-06 MED ORDER — SODIUM CHLORIDE 0.9 % IV SOLN
Freq: Once | INTRAVENOUS | Status: AC
  Administered 2014-05-06: 11:00:00 via INTRAVENOUS

## 2014-05-06 NOTE — Assessment & Plan Note (Signed)
Patient is a pleasant 63 year old Caucasian female with stage IV (T1 B., N2, M1 B.) non-small cell lung cancer adenocarcinoma currently being treated with systemic chemotherapy with cisplatin at 60 mg per meter squared on day 1 and gemcitabine at 800 mg per square M.D. 1 and 8 every 3 weeks status post 8 cycles. Overall she's tolerating this chemotherapy relatively well. She has had some reduction in her hearing but it remains stable. She is notice some mild numbness and tingling to her fingertips. She still has fine motor coordination and dexterity. She'll proceed with cycle #9 today as scheduled. Her ANC is slightly low today 1.3 and we'll monitor her counts carefully. Her hemoglobin is also slightly low at 8.1, again we will monitor this closely and arrange for packed red blood cell transfusion if needed. Patient is in agreement with this plan. She will return in 3 weeks with a restaging CT scan of the chest, abdomen and pelvis with contrast to reevaluate her disease.

## 2014-05-06 NOTE — Assessment & Plan Note (Signed)
Patient reports having increased nausea sometimes with vomiting. This occurs daily. She is managing this well with 1-2 tablets of Compazine daily. She requests a refill for her Compazine. A refill for her Compazine was sent to her pharmacy of record via E. scribed. We will continue to monitor the symptoms on future visits.

## 2014-05-06 NOTE — Progress Notes (Signed)
ANC: 1.3 Hgb: 8.1. Awilda Metro PA notified. Ok to treat

## 2014-05-06 NOTE — Progress Notes (Signed)
[facility] [phone]   Fax:([phone]) [address] [place] [address]  DIAGNOSIS: Stage IV (T1b, N2, M1b) non-small cell lung cancer, adenocarcinoma, presented with a right lower lobe lung mass as well as mediastinal, hilar, bilateral supraclavicular lymphadenopathy in addition to extensive liver metastases and retroperitoneal lymphadenopathy diagnosed in December of 2014.  MOLECULAR BIOMARKERS: [facility] biomarker testing revealed TP53 E204*, KEAP1 R653f*25+, NFKBIA amplification, NKX2-1 amplification, SMARCA4 E463*. The patient was negative for EGFR and ALK gene translocation   PRIOR THERAPY: Systemic chemotherapy with carboplatin for AUC of 5 and Alimta 500 mg/M2 every 3 weeks, status post 3 cycles. First dose 09/02/2013.   CURRENT THERAPY: Systemic chemotherapy with cisplatin 60 mg/M2 on days 1 and gemcitabine 800 mg/M2 on days 1 and 8 every 3 weeks, first cycle on 11/12/2013. Status post 8 cycles.   CHEMOTHERAPY INTENT: palliative  CURRENT # OF CHEMOTHERAPY CYCLES: 9  CURRENT ANTIEMETICS: Zofran, dexamethasone and Compazine.  CURRENT SMOKING STATUS: former smoker  ORAL CHEMOTHERAPY AND CONSENT: None  CURRENT BISPHOSPHONATES USE: None  PAIN MANAGEMENT: 0/10  NARCOTICS INDUCED CONSTIPATION: None  LIVING WILL AND CODE STATUS: Full Code  INTERVAL HISTORY: Amber HIXON89y.o. female returns to the clinic today for follow up visit accompanied by her husband. The patient is feeling fine today with no specific complaints except for fatigue and mild peripheral neuropathy affecting her fingertips. She maintains her fine motor coordination and dexterity. Her hearing remains stable, without any significant improvement or worsening.  she does tell me that she is having some increased nausea, sometimes with vomiting, usually daily around 5 PM. She has started taking her  Compazine around 2 PM and occasionally again at 8 PM with good control of the symptoms. She requests a refill for her Compazine tablets. She denied having any fever or chills. She has no significant weight loss or night sweats. She denied having any significant chest pain, shortness of breath, cough or hemoptysis. She tolerated the last cycle of her systemic chemotherapy with reduced dose cisplatin and gemcitabine fairly well.   MEDICAL HISTORY: Past Medical History  Diagnosis Date  . HTN (hypertension)   . lung ca dx'd 07/2013    ALLERGIES:  is allergic to hctz and plendil.  MEDICATIONS:  Current Outpatient Prescriptions  Medication Sig Dispense Refill  . amLODipine (NORVASC) 10 MG tablet Take 10 mg by mouth every morning.       .Marland Kitchenaspirin EC 81 MG tablet Take 81 mg by mouth every morning.       . calcium carbonate (TUMS - DOSED IN MG ELEMENTAL CALCIUM) 500 MG chewable tablet Chew 1 tablet by mouth 3 (three) times daily as needed for indigestion or heartburn.       . lidocaine-prilocaine (EMLA) cream Apply 1 application topically as needed. Apply to port 1 hour before chemo appointment  30 g  0  . lisinopril (PRINIVIL,ZESTRIL) 20 MG tablet Take 20 mg by mouth every morning.       . magnesium oxide (MAG-OX) 400 (241.3 MG) MG tablet Take 1 tablet (400 mg total) by mouth 2 (two) times daily.  60 tablet  1  . nebivolol (BYSTOLIC) 10 MG tablet Take 10 mg by mouth every morning.       . prochlorperazine (COMPAZINE) 10 MG tablet Take 1 tablet (10 mg total) by mouth every 6 (six) hours as needed for nausea or vomiting.  60 tablet  1  . oxyCODONE (ROXICODONE) 5 MG immediate release tablet Take 1 tablet (5 mg total) by mouth every 6 (six) hours as needed for severe pain.  60 tablet  0   No current facility-administered medications for this visit.   Facility-Administered Medications Ordered in Other Visits  Medication Dose Route Frequency Provider Last Rate Last Dose  . CISplatin (PLATINOL) 88 mg in  sodium chloride 0.9 % 250 mL chemo infusion  60 mg/m2 (Treatment Plan Actual) Intravenous Once Aubrie Lucien E Aryiah Monterosso, PA-C      . heparin lock flush 100 unit/mL  500 Units Intracatheter Once PRN Rhyder Koegel E Weston Kallman, PA-C      . sodium chloride 0.9 % injection 10 mL  10 mL Intracatheter PRN Carlton Adam, PA-C        REVIEW OF SYSTEMS:  Constitutional: negative Eyes: negative Ears, nose, mouth, throat, and face: positive for hearing loss Respiratory: negative Cardiovascular: negative Gastrointestinal: positive for abdominal pain Genitourinary:negative Integument/breast: negative Hematologic/lymphatic: negative Musculoskeletal:negative Neurological: positive for paresthesia and Mild involving her fingertips Behavioral/Psych: negative Endocrine: negative Allergic/Immunologic: negative   PHYSICAL EXAMINATION: General appearance: alert, cooperative and no distress Head: Normocephalic, without obvious abnormality, atraumatic Neck: no adenopathy, no JVD, supple, symmetrical, trachea midline and thyroid not enlarged, symmetric, no tenderness/mass/nodules Lymph nodes: Cervical, supraclavicular, and axillary nodes normal. Resp: clear to auscultation bilaterally Back: symmetric, no curvature. ROM normal. No CVA tenderness. Cardio: regular rate and rhythm, S1, S2 normal, no murmur, click, rub or gallop GI: soft, non-tender; bowel sounds normal; no masses,  no organomegaly Extremities: extremities normal, atraumatic, no cyanosis or edema Neurologic: Alert and oriented X 3, normal strength and tone. Normal symmetric reflexes. Normal coordination and gait  ECOG PERFORMANCE STATUS: 1 - Symptomatic but completely ambulatory  Blood pressure 138/67, pulse 75, temperature 98.2 F (36.8 C), temperature source Oral, resp. rate 18, height 5' 2"  (1.575 m), weight 109 lb 4.8 oz (49.578 kg), SpO2 100.00%.  LABORATORY DATA: Lab Results  Component Value Date   WBC 3.0* 05/06/2014   HGB 8.1* 05/06/2014   HCT  24.6* 05/06/2014   MCV 100.3 05/06/2014   PLT 152 05/06/2014      Chemistry      Component Value Date/Time   NA 137 05/06/2014 0812   K 4.0 05/06/2014 0812   CO2 25 05/06/2014 0812   BUN 8.5 05/06/2014 0812   CREATININE 0.9 05/06/2014 0812   CREATININE 0.80 08/21/2013 0800      Component Value Date/Time   CALCIUM 9.6 05/06/2014 0812       RADIOGRAPHIC STUDIES: Ct Chest W Contrast  03/23/2014   CLINICAL DATA:  Lung cancer.  Chemotherapy and progress.  EXAM: CT CHEST, ABDOMEN, AND PELVIS WITH CONTRAST  TECHNIQUE: Multidetector CT imaging of the chest, abdomen and pelvis was performed following the standard protocol during bolus administration of intravenous contrast.  CONTRAST:  17m OMNIPAQUE IOHEXOL 300 MG/ML  SOLN  COMPARISON:  01/12/2014 and PET-CT 08/26/2013  FINDINGS:   CT CHEST FINDINGS  The chest wall is unremarkable and stable. No breast masses are identified. There is small bilateral axillary lymph nodes bilaterally which are stable. No supraclavicular lymphadenopathy is identified. There were PET positive supraclavicular nodes bilaterally on the PET-CT. There is a 4.5 mm nodule noted on the left on image number 8. The Port-A-Cath is in place. No complicating features. The bony thorax is intact. No destructive bone lesions or spinal canal compromise.  The heart is normal in size. No pericardial effusion. No enlarged  mediastinal or hilar lymph nodes. Stable small scattered nodes. The subcarinal node measures 9 mm which is stable. The left hilar node measures 5.5 mm which is stable. Minimal residual nodal tissue in the right hilum.  The right lower lobe lung lesion is slightly smaller measuring 14 x 13.5 mm on image number 34. It previously measured 16 x 15.5 mm. Smaller adjacent densities are stable. No new lung lesions are identified. No acute pulmonary findings. Stable emphysematous changes. New area of subpleural atelectasis noted in the right middle lobe. Stable small subpleural nodule  near the left major fissure noted on image number 21. No pleural effusion. There is also a new discussed that was also stable subpleural nodule in the right middle lobe on image number 39    CT ABDOMEN AND PELVIS FINDINGS  The hepatic lesions continue to regress. The larger lesion in the right hepatic lobe near the dome previously measured 4.9 x 4.6 cm and now measures 3.6 x 3.0 cm. The left hepatic lobe lesion previously measured 35.5 x 25.5 mm and now measures 24 x 17.5 mm. No new lesions. No biliary dilatation. The gallbladder is normal. No common bowel duct dilatation. The pancreas is normal and stable. The spleen is normal and stable. No discrete measurable adrenal gland lesions. The left adrenal gland lesion has resolved. The kidneys are unremarkable.  The stomach, duodenum, small bowel and colon are grossly normal. No inflammatory changes, mass lesions or obstructive findings. No mesenteric or retroperitoneal mass or adenopathy. Small scattered retroperitoneal lymph nodes are stable. Stable atherosclerotic calcifications involving the aorta and branch vessels. No focal aneurysm or dissection.  Stable uterine fibroids. The bladder is normal. No pelvic mass or adenopathy. No free pelvic fluid collections. No inguinal mass or adenopathy.  The bony structures are unremarkable. No destructive bone lesions or spinal canal compromise.    IMPRESSION: Continued improved appearance of the chest, abdomen and pelvis with slight further regression of the right lower lobe pulmonary lesion, stable small mediastinal and hilar lymph nodes, regression of hepatic metastatic lesions, No residual or recurrent retroperitoneal adenopathy or adrenal gland lesions.   Electronically Signed   By: Kalman Jewels M.D.   On: 03/23/2014 09:41   ASSESSMENT AND PLAN: This is a very pleasant 63 years old white female recently diagnosed with metastatic non-small cell lung cancer, adenocarcinoma presented with right lower lobe lung mass  in addition to mediastinal and supraclavicular lymphadenopathy as well as diffuse metastatic liver lesions and retroperitoneal lymphadenopathy. She underwent 3 cycles of systemic chemotherapy with carboplatin and Alimta but unfortunately has evidence for disease progression especially in the liver and new small left adrenal metastasis. She is currently undergoing second line chemotherapy with cisplatin and gemcitabine status post 6 cycles and tolerating it fairly well patient after reducing the dose of cisplatin. The last CT scan of the chest, abdomen and pelvis showed continuous improvement in her disease in the chest, abdomen and pelvis. Patient was discussed with also seen by Dr. Julien Nordmann. She will continue the same treatment with reduced dose cisplatin and gemcitabine. She will start cycle #9 today. She will followup visit in 3 weeks with the next cycle of her treatment as well as a restaging CT scan of her chest, abdomen and pelvis with contrast to re-evaluate her disease. Neuropathy symptoms are mild and her hearing loss is stable. She is beginning to see some improvement in her disease we will continue with her chemotherapy as it is but we'll continue to monitor her symptoms closely  on future visits. She was advised to call immediately if she has any concerning symptoms in the interval. The patient voices understanding of current disease status and treatment options and is in agreement with the current care plan.  All questions were answered. The patient knows to call the clinic with any problems, questions or concerns. We can certainly see the patient much sooner if necessary.  Lung cancer Patient is a pleasant 63 year old Caucasian female with stage IV (T1 B., N2, M1 B.) non-small cell lung cancer adenocarcinoma currently being treated with systemic chemotherapy with cisplatin at 60 mg per meter squared on day 1 and gemcitabine at 800 mg per square M.D. 1 and 8 every 3 weeks status post 8 cycles.  Overall she's tolerating this chemotherapy relatively well. She has had some reduction in her hearing but it remains stable. She is notice some mild numbness and tingling to her fingertips. She still has fine motor coordination and dexterity. She'll proceed with cycle #9 today as scheduled. Her ANC is slightly low today 1.3 and we'll monitor her counts carefully. Her hemoglobin is also slightly low at 8.1, again we will monitor this closely and arrange for packed red blood cell transfusion if needed. Patient is in agreement with this plan. She will return in 3 weeks with a restaging CT scan of the chest, abdomen and pelvis with contrast to reevaluate her disease.  Nausea with vomiting Patient reports having increased nausea sometimes with vomiting. This occurs daily. She is managing this well with 1-2 tablets of Compazine daily. She requests a refill for her Compazine. A refill for her Compazine was sent to her pharmacy of record via E. scribed. We will continue to monitor the symptoms on future visits.   Disclaimer: This note was dictated with voice recognition software. Similar sounding words can inadvertently be transcribed and may not be corrected upon review. Carlton Adam, PA-C 05/06/2014  ADDENDUM: Hematology/Oncology Attending: I had a face to face encounter with the patient. I recommended her care plan. This is a very pleasant 63 years old white female with metastatic adenocarcinoma of questionable lung versus hepatobiliary primary. She is currently undergoing systemic chemotherapy with reduced dose cisplatin and gemcitabine status post 8 cycles. She is tolerating her treatment fairly well with no significant adverse effect except for the persistent mild neuropathy which is not worse over the last few weeks. She also has some healing issues and she was evaluated by ENT. The patient denied having any significant fever or chills. She has no nausea or vomiting. I recommended for her to  proceed with cycle #9 today as scheduled. She would come back for followup visit in 3 weeks after repeating CT scan of the chest, abdomen and pelvis for restaging of her disease. She was advised to call immediately if she has any concerning symptoms in the interval.  Disclaimer: This note was dictated with voice recognition software. Similar sounding words can inadvertently be transcribed and may be missed upon review. Eilleen Kempf., MD 05/08/2014

## 2014-05-06 NOTE — Patient Instructions (Signed)
[facility] Discharge Instructions for Patients Receiving Chemotherapy  Today you received the following chemotherapy agents Gemzar/cisplatin.  To help prevent nausea and vomiting after your treatment, we encourage you to take your nausea medication as directed.   If you develop nausea and vomiting that is not controlled by your nausea medication, call the clinic.   BELOW ARE SYMPTOMS THAT SHOULD BE REPORTED IMMEDIATELY:  *FEVER GREATER THAN 100.5 F  *CHILLS WITH OR WITHOUT FEVER  NAUSEA AND VOMITING THAT IS NOT CONTROLLED WITH YOUR NAUSEA MEDICATION  *UNUSUAL SHORTNESS OF BREATH  *UNUSUAL BRUISING OR BLEEDING  TENDERNESS IN MOUTH AND THROAT WITH OR WITHOUT PRESENCE OF ULCERS  *URINARY PROBLEMS  *BOWEL PROBLEMS  UNUSUAL RASH Items with * indicate a potential emergency and should be followed up as soon as possible.  Feel free to call the clinic you have any questions or concerns. The clinic phone number is [phone].

## 2014-05-06 NOTE — Patient Instructions (Signed)

## 2014-05-07 NOTE — Patient Instructions (Signed)
Continue weekly labs as scheduled Followup in 3 weeks with restaging CT scan of the chest, abdomen and pelvis to reevaluate your disease

## 2014-05-13 ENCOUNTER — Ambulatory Visit: Payer: 59

## 2014-05-13 ENCOUNTER — Other Ambulatory Visit: Payer: Self-pay | Admitting: Medical Oncology

## 2014-05-13 ENCOUNTER — Ambulatory Visit (HOSPITAL_BASED_OUTPATIENT_CLINIC_OR_DEPARTMENT_OTHER): Payer: 59

## 2014-05-13 ENCOUNTER — Telehealth: Payer: Self-pay | Admitting: Internal Medicine

## 2014-05-13 ENCOUNTER — Ambulatory Visit (HOSPITAL_COMMUNITY)
Admission: RE | Admit: 2014-05-13 | Discharge: 2014-05-13 | Disposition: A | Discharge status: Home / Self Care | Payer: 59 | Source: Ambulatory Visit | Attending: Internal Medicine | Admitting: Internal Medicine

## 2014-05-13 ENCOUNTER — Other Ambulatory Visit (HOSPITAL_BASED_OUTPATIENT_CLINIC_OR_DEPARTMENT_OTHER): Payer: 59

## 2014-05-13 ENCOUNTER — Other Ambulatory Visit: Payer: 59

## 2014-05-13 VITALS — BP 120/56 | HR 78 | Temp 98.4°F | Resp 18

## 2014-05-13 DIAGNOSIS — C7970 Secondary malignant neoplasm of unspecified adrenal gland: Secondary | ICD-10-CM

## 2014-05-13 DIAGNOSIS — C221 Intrahepatic bile duct carcinoma: Secondary | ICD-10-CM

## 2014-05-13 DIAGNOSIS — Z95828 Presence of other vascular implants and grafts: Secondary | ICD-10-CM

## 2014-05-13 DIAGNOSIS — C3430 Malignant neoplasm of lower lobe, unspecified bronchus or lung: Secondary | ICD-10-CM

## 2014-05-13 DIAGNOSIS — Z23 Encounter for immunization: Secondary | ICD-10-CM

## 2014-05-13 DIAGNOSIS — C3491 Malignant neoplasm of unspecified part of right bronchus or lung: Secondary | ICD-10-CM

## 2014-05-13 DIAGNOSIS — C3490 Malignant neoplasm of unspecified part of unspecified bronchus or lung: Secondary | ICD-10-CM | POA: Diagnosis not present

## 2014-05-13 DIAGNOSIS — Z5111 Encounter for antineoplastic chemotherapy: Secondary | ICD-10-CM

## 2014-05-13 DIAGNOSIS — D649 Anemia, unspecified: Secondary | ICD-10-CM | POA: Insufficient documentation

## 2014-05-13 DIAGNOSIS — T451X5A Adverse effect of antineoplastic and immunosuppressive drugs, initial encounter: Secondary | ICD-10-CM

## 2014-05-13 DIAGNOSIS — C787 Secondary malignant neoplasm of liver and intrahepatic bile duct: Secondary | ICD-10-CM

## 2014-05-13 DIAGNOSIS — D6481 Anemia due to antineoplastic chemotherapy: Secondary | ICD-10-CM

## 2014-05-13 LAB — CBC WITH DIFFERENTIAL/PLATELET
BASO%: 0.3 % (ref 0.0–2.0)
BASOS ABS: 0 10*3/uL (ref 0.0–0.1)
EOS%: 0.3 % (ref 0.0–7.0)
Eosinophils Absolute: 0 10*3/uL (ref 0.0–0.5)
HCT: 22.8 % — ABNORMAL LOW (ref 34.8–46.6)
HEMOGLOBIN: 7.7 g/dL — AB (ref 11.6–15.9)
LYMPH%: 52.4 % — ABNORMAL HIGH (ref 14.0–49.7)
MCH: 32.8 pg (ref 25.1–34.0)
MCHC: 33.8 g/dL (ref 31.5–36.0)
MCV: 97 fL (ref 79.5–101.0)
MONO#: 0.1 10*3/uL (ref 0.1–0.9)
MONO%: 4.1 % (ref 0.0–14.0)
NEUT#: 1.3 10*3/uL — ABNORMAL LOW (ref 1.5–6.5)
NEUT%: 42.9 % (ref 38.4–76.8)
Platelets: 170 10*3/uL (ref 145–400)
RBC: 2.35 10*6/uL — AB (ref 3.70–5.45)
RDW: 16.1 % — ABNORMAL HIGH (ref 11.2–14.5)
WBC: 3 10*3/uL — ABNORMAL LOW (ref 3.9–10.3)
lymph#: 1.6 10*3/uL (ref 0.9–3.3)
nRBC: 0 % (ref 0–0)

## 2014-05-13 LAB — COMPREHENSIVE METABOLIC PANEL
ALK PHOS: 94 U/L (ref 39–117)
ALT: 25 U/L (ref 0–35)
AST: 21 U/L (ref 0–37)
Albumin: 3.4 g/dL — ABNORMAL LOW (ref 3.5–5.2)
BILIRUBIN TOTAL: 0.2 mg/dL — AB (ref 0.2–1.2)
BUN: 18 mg/dL (ref 6–23)
CO2: 29 mEq/L (ref 19–32)
CREATININE: 1 mg/dL (ref 0.50–1.10)
Calcium: 9.5 mg/dL (ref 8.4–10.5)
Chloride: 96 mEq/L (ref 96–112)
Glucose, Bld: 142 mg/dL — ABNORMAL HIGH (ref 70–99)
Potassium: 4.5 mEq/L (ref 3.5–5.3)
Sodium: 135 mEq/L (ref 135–145)
Total Protein: 6.6 g/dL (ref 6.0–8.3)

## 2014-05-13 LAB — PREPARE RBC (CROSSMATCH)

## 2014-05-13 LAB — MAGNESIUM: Magnesium: 1.4 mg/dL — ABNORMAL LOW (ref 1.5–2.5)

## 2014-05-13 LAB — HOLD TUBE, BLOOD BANK

## 2014-05-13 LAB — ABO/RH: ABO/RH(D): O POS

## 2014-05-13 MED ORDER — SODIUM CHLORIDE 0.9 % IJ SOLN
10.0000 mL | INTRAMUSCULAR | Status: DC | PRN
  Administered 2014-05-13: 10 mL via INTRAVENOUS
  Filled 2014-05-13: qty 10

## 2014-05-13 MED ORDER — SODIUM CHLORIDE 0.9 % IV SOLN
800.0000 mg/m2 | Freq: Once | INTRAVENOUS | Status: AC
  Administered 2014-05-13: 1178 mg via INTRAVENOUS
  Filled 2014-05-13: qty 30.98

## 2014-05-13 MED ORDER — ACETAMINOPHEN 325 MG PO TABS
650.0000 mg | ORAL_TABLET | Freq: Once | ORAL | Status: AC
  Administered 2014-05-13: 650 mg via ORAL

## 2014-05-13 MED ORDER — DIPHENHYDRAMINE HCL 25 MG PO CAPS
25.0000 mg | ORAL_CAPSULE | Freq: Once | ORAL | Status: AC
  Administered 2014-05-13: 25 mg via ORAL

## 2014-05-13 MED ORDER — PROCHLORPERAZINE MALEATE 10 MG PO TABS
10.0000 mg | ORAL_TABLET | Freq: Once | ORAL | Status: AC
  Administered 2014-05-13: 10 mg via ORAL

## 2014-05-13 MED ORDER — INFLUENZA VAC SPLIT QUAD 0.5 ML IM SUSY
0.5000 mL | PREFILLED_SYRINGE | Freq: Once | INTRAMUSCULAR | Status: AC
  Administered 2014-05-13: 0.5 mL via INTRAMUSCULAR
  Filled 2014-05-13: qty 0.5

## 2014-05-13 MED ORDER — PROCHLORPERAZINE MALEATE 10 MG PO TABS: 10.0000 mg | ORAL_TABLET | Freq: Four times a day (QID) | ORAL | Status: DC | PRN

## 2014-05-13 MED ORDER — ACETAMINOPHEN 325 MG PO TABS
ORAL_TABLET | ORAL | Status: AC
  Filled 2014-05-13: qty 2

## 2014-05-13 MED ORDER — SODIUM CHLORIDE 0.9 % IJ SOLN
10.0000 mL | INTRAMUSCULAR | Status: DC | PRN
  Administered 2014-05-13: 10 mL
  Filled 2014-05-13: qty 10

## 2014-05-13 MED ORDER — SODIUM CHLORIDE 0.9 % IV SOLN
Freq: Once | INTRAVENOUS | Status: AC
  Administered 2014-05-13: 11:00:00 via INTRAVENOUS

## 2014-05-13 MED ORDER — PROCHLORPERAZINE MALEATE 10 MG PO TABS
ORAL_TABLET | ORAL | Status: AC
  Filled 2014-05-13: qty 1

## 2014-05-13 MED ORDER — DIPHENHYDRAMINE HCL 25 MG PO CAPS
ORAL_CAPSULE | ORAL | Status: AC
  Filled 2014-05-13: qty 1

## 2014-05-13 MED ORDER — HEPARIN SOD (PORK) LOCK FLUSH 100 UNIT/ML IV SOLN
500.0000 [IU] | Freq: Once | INTRAVENOUS | Status: AC | PRN
  Administered 2014-05-13: 500 [IU]
  Filled 2014-05-13: qty 5

## 2014-05-13 NOTE — Progress Notes (Signed)
VSS, Blood tolerated well and continues at 185 cc/hr.

## 2014-05-13 NOTE — Progress Notes (Signed)
1st unit blood at this time and tolerated well with no signs of reaction.

## 2014-05-13 NOTE — Telephone Encounter (Signed)
cld pt & left message for appt time & date

## 2014-05-13 NOTE — Patient Instructions (Signed)

## 2014-05-13 NOTE — Progress Notes (Signed)
Per Dr. Lynn Ito to treat today with WBC 3.0 ANC 1.3. Repeat CBC 05/18/14

## 2014-05-13 NOTE — Patient Instructions (Addendum)
[facility] Discharge Instructions for Patients Receiving Chemotherapy  Today you received the following chemotherapy agents GEMZAR  To help prevent nausea and vomiting after your treatment, we encourage you to take your nausea medication Compazine 10 mg as ordered by provider.   If you develop nausea and vomiting that is not controlled by your nausea medication, call the clinic.   BELOW ARE SYMPTOMS THAT SHOULD BE REPORTED IMMEDIATELY:  *FEVER GREATER THAN 100.5 F  *CHILLS WITH OR WITHOUT FEVER  NAUSEA AND VOMITING THAT IS NOT CONTROLLED WITH YOUR NAUSEA MEDICATION  *UNUSUAL SHORTNESS OF BREATH  *UNUSUAL BRUISING OR BLEEDING  TENDERNESS IN MOUTH AND THROAT WITH OR WITHOUT PRESENCE OF ULCERS  *URINARY PROBLEMS  *BOWEL PROBLEMS  UNUSUAL RASH Items with * indicate a potential emergency and should be followed up as soon as possible.  Feel free to call the clinic you have any questions or concerns. The clinic phone number is [phone].   Blood Transfusion Information WHAT IS A BLOOD TRANSFUSION? A transfusion is the replacement of blood or some of its parts. Blood is made up of multiple cells which provide different functions.  Red blood cells carry oxygen and are used for blood loss replacement.  White blood cells fight against infection.  Platelets control bleeding.  Plasma helps clot blood.  Other blood products are available for specialized needs, such as hemophilia or other clotting disorders. BEFORE THE TRANSFUSION  Who gives blood for transfusions?   You may be able to donate blood to be used at a later date on yourself (autologous donation).  Relatives can be asked to donate blood. This is generally not any safer than if you have received blood from a stranger. The same precautions are taken to ensure safety when a relative's blood is donated.  Healthy volunteers who are fully evaluated to make sure their blood is safe. This is blood bank  blood. Transfusion therapy is the safest it has ever been in the practice of medicine. Before blood is taken from a donor, a complete history is taken to make sure that person has no history of diseases nor engages in risky social behavior (examples are intravenous drug use or sexual activity with multiple partners). The donor's travel history is screened to minimize risk of transmitting infections, such as malaria. The donated blood is tested for signs of infectious diseases, such as HIV and hepatitis. The blood is then tested to be sure it is compatible with you in order to minimize the chance of a transfusion reaction. If you or a relative donates blood, this is often done in anticipation of surgery and is not appropriate for emergency situations. It takes many days to process the donated blood. RISKS AND COMPLICATIONS Although transfusion therapy is very safe and saves many lives, the main dangers of transfusion include:   Getting an infectious disease.  Developing a transfusion reaction. This is an allergic reaction to something in the blood you were given. Every precaution is taken to prevent this. The decision to have a blood transfusion has been considered carefully by your caregiver before blood is given. Blood is not given unless the benefits outweigh the risks. AFTER THE TRANSFUSION  Right after receiving a blood transfusion, you will usually feel much better and more energetic. This is especially true if your red blood cells have gotten low (anemic). The transfusion raises the level of the red blood cells which carry oxygen, and this usually causes an energy increase.  The nurse administering the  transfusion will monitor you carefully for complications. HOME CARE INSTRUCTIONS  No special instructions are needed after a transfusion. You may find your energy is better. Speak with your caregiver about any limitations on activity for underlying diseases you may have. SEEK MEDICAL CARE IF:    Your condition is not improving after your transfusion.  You develop redness or irritation at the intravenous (IV) site. SEEK IMMEDIATE MEDICAL CARE IF:  Any of the following symptoms occur over the next 12 hours:  Shaking chills.  You have a temperature by mouth above 102 F (38.9 C), not controlled by medicine.  Chest, back, or muscle pain.  People around you feel you are not acting correctly or are confused.  Shortness of breath or difficulty breathing.  Dizziness and fainting.  You get a rash or develop hives.  You have a decrease in urine output.  Your urine turns a dark color or changes to pink, red, or brown. Any of the following symptoms occur over the next 10 days:  You have a temperature by mouth above 102 F (38.9 C), not controlled by medicine.  Shortness of breath.  Weakness after normal activity.  The white part of the eye turns yellow (jaundice).  You have a decrease in the amount of urine or are urinating less often.  Your urine turns a dark color or changes to pink, red, or brown. Document Released: 08/10/2000 Document Revised: 11/05/2011 Document Reviewed: 03/29/2008 [facility] Patient Information 2015 [place], [place]. This information is not intended to replace advice given to you by your health care provider. Make sure you discuss any questions you have with your health care provider. Hypomagnesemia  Increase your magnesium oxide tablets to 400 mg tablets twice a day. I called in a refill for your compazine.-Diane Bell,RN Magnesium is a common ion (mineral) in the body which is needed for metabolism. It is about how the body handles food and other chemical reactions necessary for life. Only about 2% of the magnesium in our body is found in the blood. When this is low, it is called hypomagnesemia. The blood will measure only a tiny amount of the magnesium in our body. When it is low in our blood, it does not mean that the whole body supply is low. The  normal serum concentration ranges from 1.8-2.5 mEq/L. When the level gets to be less than 1.0 mEq/L, a number of problems begin to happen.  CAUSES   Receiving intravenous fluids without magnesium replacement.  Loss of magnesium from the bowel by nasogastric suction.  Loss of magnesium from nausea and vomiting or severe diarrhea. Any of the inflammatory bowel conditions can cause this.  Abuse of alcohol often leads to low serum magnesium.  An inherited form of magnesium loss happens when the kidneys lose magnesium. This is called familial or primary hypomagnesemia.  Some medications such as diuretics also cause the loss of magnesium. SYMPTOMS  These following problems are worse if the changes in magnesium levels come on suddenly.  Tremor.  Confusion.  Muscle weakness.  Oversensitive to sights and sounds.  Sensitive reflexes.  Depression.  Muscular fibrillations.  Overreactivity of the nerves.  Irritability.  Psychosis.  Spasms of the hand muscles.  Tetany (where the muscles go into uncontrollable spasms). DIAGNOSIS  This condition can be diagnosed by blood tests. TREATMENT   In an emergency, magnesium can be given intravenously (by vein).  If the condition is less worrisome, it can be corrected by diet. High levels of magnesium are found in  green leafy vegetables, peas, beans, and nuts among other things. It can also be given through medications by mouth.  If it is being caused by medications, changes can be made.  If alcohol is a problem, help is available if there are difficulties giving it up. Document Released: 05/09/2005 Document Revised: 12/28/2013 Document Reviewed: 04/02/2008 [facility] Patient Information 2015 [place], [place]. This information is not intended to replace advice given to you by your health care provider. Make sure you discuss any questions you have with your health care provider.

## 2014-05-13 NOTE — Progress Notes (Signed)
I instructed pt to increase magnesium oxide tablets to one twice a day.

## 2014-05-13 NOTE — Progress Notes (Signed)
Blood started at 1330 at 50 ml/hr.  VSS with no signs of reaction.  Rate increased to 185 ml/hr.

## 2014-05-14 LAB — TYPE AND SCREEN
ABO/RH(D): O POS
Antibody Screen: NEGATIVE
UNIT DIVISION: 0
Unit division: 0

## 2014-05-18 ENCOUNTER — Other Ambulatory Visit (HOSPITAL_BASED_OUTPATIENT_CLINIC_OR_DEPARTMENT_OTHER): Payer: 59

## 2014-05-18 ENCOUNTER — Ambulatory Visit (HOSPITAL_BASED_OUTPATIENT_CLINIC_OR_DEPARTMENT_OTHER): Payer: 59

## 2014-05-18 VITALS — BP 142/63 | HR 67 | Temp 97.9°F

## 2014-05-18 DIAGNOSIS — C3430 Malignant neoplasm of lower lobe, unspecified bronchus or lung: Secondary | ICD-10-CM

## 2014-05-18 DIAGNOSIS — C787 Secondary malignant neoplasm of liver and intrahepatic bile duct: Secondary | ICD-10-CM

## 2014-05-18 DIAGNOSIS — Z452 Encounter for adjustment and management of vascular access device: Secondary | ICD-10-CM

## 2014-05-18 DIAGNOSIS — C221 Intrahepatic bile duct carcinoma: Secondary | ICD-10-CM

## 2014-05-18 DIAGNOSIS — Z95828 Presence of other vascular implants and grafts: Secondary | ICD-10-CM

## 2014-05-18 LAB — COMPREHENSIVE METABOLIC PANEL (CC13)
ALBUMIN: 3.4 g/dL — AB (ref 3.5–5.0)
ALT: 18 U/L (ref 0–55)
ANION GAP: 8 meq/L (ref 3–11)
AST: 22 U/L (ref 5–34)
Alkaline Phosphatase: 102 U/L (ref 40–150)
BUN: 12.3 mg/dL (ref 7.0–26.0)
CHLORIDE: 99 meq/L (ref 98–109)
CO2: 26 mEq/L (ref 22–29)
Calcium: 9.4 mg/dL (ref 8.4–10.4)
Creatinine: 0.9 mg/dL (ref 0.6–1.1)
GLUCOSE: 102 mg/dL (ref 70–140)
POTASSIUM: 4.3 meq/L (ref 3.5–5.1)
Sodium: 134 mEq/L — ABNORMAL LOW (ref 136–145)
TOTAL PROTEIN: 6.6 g/dL (ref 6.4–8.3)
Total Bilirubin: 0.58 mg/dL (ref 0.20–1.20)

## 2014-05-18 LAB — CBC WITH DIFFERENTIAL/PLATELET
BASO%: 0.3 % (ref 0.0–2.0)
Basophils Absolute: 0 10*3/uL (ref 0.0–0.1)
EOS ABS: 0 10*3/uL (ref 0.0–0.5)
EOS%: 0.1 % (ref 0.0–7.0)
HCT: 34.3 % — ABNORMAL LOW (ref 34.8–46.6)
HGB: 11.4 g/dL — ABNORMAL LOW (ref 11.6–15.9)
LYMPH%: 34.4 % (ref 14.0–49.7)
MCH: 31.7 pg (ref 25.1–34.0)
MCHC: 33.1 g/dL (ref 31.5–36.0)
MCV: 95.7 fL (ref 79.5–101.0)
MONO#: 0 10*3/uL — ABNORMAL LOW (ref 0.1–0.9)
MONO%: 1.2 % (ref 0.0–14.0)
NEUT#: 2.1 10*3/uL (ref 1.5–6.5)
NEUT%: 64 % (ref 38.4–76.8)
PLATELETS: 59 10*3/uL — AB (ref 145–400)
RBC: 3.59 10*6/uL — ABNORMAL LOW (ref 3.70–5.45)
RDW: 15.4 % — AB (ref 11.2–14.5)
WBC: 3.3 10*3/uL — ABNORMAL LOW (ref 3.9–10.3)
lymph#: 1.1 10*3/uL (ref 0.9–3.3)

## 2014-05-18 LAB — MAGNESIUM (CC13): Magnesium: 1.9 mg/dl (ref 1.5–2.5)

## 2014-05-18 MED ORDER — SODIUM CHLORIDE 0.9 % IJ SOLN
10.0000 mL | INTRAMUSCULAR | Status: DC | PRN
  Administered 2014-05-18: 10 mL via INTRAVENOUS
  Filled 2014-05-18: qty 10

## 2014-05-18 MED ORDER — HEPARIN SOD (PORK) LOCK FLUSH 100 UNIT/ML IV SOLN
500.0000 [IU] | Freq: Once | INTRAVENOUS | Status: AC
  Administered 2014-05-18: 500 [IU] via INTRAVENOUS
  Filled 2014-05-18: qty 5

## 2014-05-18 NOTE — Patient Instructions (Signed)

## 2014-05-24 ENCOUNTER — Ambulatory Visit (HOSPITAL_COMMUNITY)
Admission: RE | Admit: 2014-05-24 | Discharge: 2014-05-24 | Disposition: A | Discharge status: Home / Self Care | Payer: 59 | Source: Ambulatory Visit | Attending: Physician Assistant | Admitting: Physician Assistant

## 2014-05-24 ENCOUNTER — Encounter (HOSPITAL_COMMUNITY): Payer: Self-pay

## 2014-05-24 DIAGNOSIS — C787 Secondary malignant neoplasm of liver and intrahepatic bile duct: Secondary | ICD-10-CM | POA: Insufficient documentation

## 2014-05-24 DIAGNOSIS — C3490 Malignant neoplasm of unspecified part of unspecified bronchus or lung: Secondary | ICD-10-CM

## 2014-05-24 DIAGNOSIS — Z79899 Other long term (current) drug therapy: Secondary | ICD-10-CM | POA: Diagnosis present

## 2014-05-24 DIAGNOSIS — M51379 Other intervertebral disc degeneration, lumbosacral region without mention of lumbar back pain or lower extremity pain: Secondary | ICD-10-CM | POA: Insufficient documentation

## 2014-05-24 DIAGNOSIS — M5137 Other intervertebral disc degeneration, lumbosacral region: Secondary | ICD-10-CM | POA: Insufficient documentation

## 2014-05-24 DIAGNOSIS — I700 Atherosclerosis of aorta: Secondary | ICD-10-CM | POA: Diagnosis not present

## 2014-05-24 MED ORDER — IOHEXOL 300 MG/ML  SOLN
100.0000 mL | Freq: Once | INTRAMUSCULAR | Status: AC | PRN
  Administered 2014-05-24: 100 mL via INTRAVENOUS

## 2014-05-27 ENCOUNTER — Ambulatory Visit: Payer: 59 | Admitting: Internal Medicine

## 2014-05-27 ENCOUNTER — Encounter: Payer: Self-pay | Admitting: Nurse Practitioner

## 2014-05-27 ENCOUNTER — Other Ambulatory Visit: Payer: 59

## 2014-05-27 ENCOUNTER — Telehealth: Payer: Self-pay | Admitting: *Deleted

## 2014-05-27 ENCOUNTER — Ambulatory Visit (HOSPITAL_BASED_OUTPATIENT_CLINIC_OR_DEPARTMENT_OTHER): Payer: 59 | Admitting: Nurse Practitioner

## 2014-05-27 ENCOUNTER — Ambulatory Visit (HOSPITAL_BASED_OUTPATIENT_CLINIC_OR_DEPARTMENT_OTHER): Payer: 59

## 2014-05-27 ENCOUNTER — Other Ambulatory Visit (HOSPITAL_BASED_OUTPATIENT_CLINIC_OR_DEPARTMENT_OTHER): Payer: 59

## 2014-05-27 ENCOUNTER — Ambulatory Visit: Payer: 59

## 2014-05-27 ENCOUNTER — Telehealth: Payer: Self-pay | Admitting: Nurse Practitioner

## 2014-05-27 ENCOUNTER — Other Ambulatory Visit: Payer: Self-pay | Admitting: *Deleted

## 2014-05-27 VITALS — BP 142/66 | HR 68 | Temp 97.8°F | Resp 18 | Ht 62.0 in | Wt 110.9 lb

## 2014-05-27 DIAGNOSIS — R112 Nausea with vomiting, unspecified: Secondary | ICD-10-CM

## 2014-05-27 DIAGNOSIS — C801 Malignant (primary) neoplasm, unspecified: Secondary | ICD-10-CM

## 2014-05-27 DIAGNOSIS — C3491 Malignant neoplasm of unspecified part of right bronchus or lung: Secondary | ICD-10-CM

## 2014-05-27 DIAGNOSIS — C787 Secondary malignant neoplasm of liver and intrahepatic bile duct: Secondary | ICD-10-CM

## 2014-05-27 DIAGNOSIS — C221 Intrahepatic bile duct carcinoma: Secondary | ICD-10-CM

## 2014-05-27 DIAGNOSIS — Z95828 Presence of other vascular implants and grafts: Secondary | ICD-10-CM

## 2014-05-27 DIAGNOSIS — H9190 Unspecified hearing loss, unspecified ear: Secondary | ICD-10-CM | POA: Insufficient documentation

## 2014-05-27 DIAGNOSIS — H9193 Unspecified hearing loss, bilateral: Secondary | ICD-10-CM

## 2014-05-27 DIAGNOSIS — G620 Drug-induced polyneuropathy: Secondary | ICD-10-CM | POA: Insufficient documentation

## 2014-05-27 DIAGNOSIS — Z5111 Encounter for antineoplastic chemotherapy: Secondary | ICD-10-CM

## 2014-05-27 DIAGNOSIS — T451X5A Adverse effect of antineoplastic and immunosuppressive drugs, initial encounter: Secondary | ICD-10-CM

## 2014-05-27 LAB — COMPREHENSIVE METABOLIC PANEL (CC13)
ALBUMIN: 3.5 g/dL (ref 3.5–5.0)
ALT: 14 U/L (ref 0–55)
AST: 20 U/L (ref 5–34)
Alkaline Phosphatase: 99 U/L (ref 40–150)
Anion Gap: 7 mEq/L (ref 3–11)
BILIRUBIN TOTAL: 0.31 mg/dL (ref 0.20–1.20)
BUN: 11.1 mg/dL (ref 7.0–26.0)
CO2: 28 mEq/L (ref 22–29)
Calcium: 10.1 mg/dL (ref 8.4–10.4)
Chloride: 101 mEq/L (ref 98–109)
Creatinine: 1 mg/dL (ref 0.6–1.1)
Glucose: 101 mg/dl (ref 70–140)
Potassium: 4.6 mEq/L (ref 3.5–5.1)
SODIUM: 136 meq/L (ref 136–145)
TOTAL PROTEIN: 6.6 g/dL (ref 6.4–8.3)

## 2014-05-27 LAB — CBC WITH DIFFERENTIAL/PLATELET
BASO%: 0 % (ref 0.0–2.0)
Basophils Absolute: 0 10*3/uL (ref 0.0–0.1)
EOS%: 0.3 % (ref 0.0–7.0)
Eosinophils Absolute: 0 10*3/uL (ref 0.0–0.5)
HCT: 31.8 % — ABNORMAL LOW (ref 34.8–46.6)
HGB: 10.8 g/dL — ABNORMAL LOW (ref 11.6–15.9)
LYMPH#: 1.1 10*3/uL (ref 0.9–3.3)
LYMPH%: 35.2 % (ref 14.0–49.7)
MCH: 32 pg (ref 25.1–34.0)
MCHC: 34 g/dL (ref 31.5–36.0)
MCV: 94.1 fL (ref 79.5–101.0)
MONO#: 0.6 10*3/uL (ref 0.1–0.9)
MONO%: 17.9 % — AB (ref 0.0–14.0)
NEUT#: 1.5 10*3/uL (ref 1.5–6.5)
NEUT%: 46.6 % (ref 38.4–76.8)
Platelets: 127 10*3/uL — ABNORMAL LOW (ref 145–400)
RBC: 3.38 10*6/uL — ABNORMAL LOW (ref 3.70–5.45)
RDW: 15.3 % — AB (ref 11.2–14.5)
WBC: 3.2 10*3/uL — ABNORMAL LOW (ref 3.9–10.3)
nRBC: 0 % (ref 0–0)

## 2014-05-27 LAB — MAGNESIUM (CC13): Magnesium: 2.3 mg/dl (ref 1.5–2.5)

## 2014-05-27 MED ORDER — PALONOSETRON HCL INJECTION 0.25 MG/5ML
INTRAVENOUS | Status: AC
Start: 2014-05-27 — End: 2014-05-27
  Filled 2014-05-27: qty 5

## 2014-05-27 MED ORDER — SODIUM CHLORIDE 0.9 % IJ SOLN
10.0000 mL | INTRAMUSCULAR | Status: DC | PRN
  Administered 2014-05-27: 10 mL
  Filled 2014-05-27: qty 10

## 2014-05-27 MED ORDER — HEPARIN SOD (PORK) LOCK FLUSH 100 UNIT/ML IV SOLN
500.0000 [IU] | Freq: Once | INTRAVENOUS | Status: AC | PRN
  Administered 2014-05-27: 500 [IU]
  Filled 2014-05-27: qty 5

## 2014-05-27 MED ORDER — SODIUM CHLORIDE 0.9 % IJ SOLN
10.0000 mL | INTRAMUSCULAR | Status: DC | PRN
  Administered 2014-05-27: 10 mL via INTRAVENOUS
  Filled 2014-05-27: qty 10

## 2014-05-27 MED ORDER — SODIUM CHLORIDE 0.9 % IV SOLN
60.0000 mg/m2 | Freq: Once | INTRAVENOUS | Status: AC
  Administered 2014-05-27: 88 mg via INTRAVENOUS
  Filled 2014-05-27: qty 88

## 2014-05-27 MED ORDER — DEXAMETHASONE SODIUM PHOSPHATE 20 MG/5ML IJ SOLN
12.0000 mg | Freq: Once | INTRAMUSCULAR | Status: AC
  Administered 2014-05-27: 12 mg via INTRAVENOUS

## 2014-05-27 MED ORDER — SODIUM CHLORIDE 0.9 % IV SOLN
150.0000 mg | Freq: Once | INTRAVENOUS | Status: AC
  Administered 2014-05-27: 150 mg via INTRAVENOUS
  Filled 2014-05-27: qty 5

## 2014-05-27 MED ORDER — DEXAMETHASONE SODIUM PHOSPHATE 20 MG/5ML IJ SOLN
INTRAMUSCULAR | Status: AC
  Filled 2014-05-27: qty 5

## 2014-05-27 MED ORDER — LIDOCAINE-PRILOCAINE 2.5-2.5 % EX CREA: 1.0000 "application " | TOPICAL_CREAM | CUTANEOUS | Status: DC | PRN

## 2014-05-27 MED ORDER — SODIUM CHLORIDE 0.9 % IV SOLN
Freq: Once | INTRAVENOUS | Status: AC
  Administered 2014-05-27: 10:00:00 via INTRAVENOUS

## 2014-05-27 MED ORDER — SODIUM CHLORIDE 0.9 % IV SOLN
800.0000 mg/m2 | Freq: Once | INTRAVENOUS | Status: AC
  Administered 2014-05-27: 1178 mg via INTRAVENOUS
  Filled 2014-05-27: qty 30.98

## 2014-05-27 MED ORDER — MANNITOL 25 % IV SOLN
Freq: Once | INTRAVENOUS | Status: AC
  Administered 2014-05-27: 11:00:00 via INTRAVENOUS
  Filled 2014-05-27: qty 10

## 2014-05-27 MED ORDER — PALONOSETRON HCL INJECTION 0.25 MG/5ML
0.2500 mg | Freq: Once | INTRAVENOUS | Status: AC
  Administered 2014-05-27: 0.25 mg via INTRAVENOUS

## 2014-05-27 NOTE — Progress Notes (Signed)
[place]   Chief Complaint  Patient presents with  . Follow-up    HPI: Amber Hayden 63 y.o. female diagnosed with lung cancer.  Currently undergoing cisplatin/gemcitabine chemotherapy regimen.  Patient presents to the [facility] today for followup and to receive cycle 10 of her chemotherapy.  She had a restaging scan earlier this week.  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  She also has some chronic hearing loss of from previous cisplatin use.  Cisplatin has been decreased to 2 issues with previous hearing loss.  Also, patient states she has been evaluated by an audiologist for hearing loss as well.  Patient is here to hear what her magnesium results are today since she has increased her magnesium tablets 22 tablets per day.  She also is requesting an enema cream refill today as well.  Patient denies any other symptoms whatsoever.  She denies any recent fevers or chills.     HPI  CURRENT THERAPY: Upcoming Treatment Dates - LUNG Cisplatin D1 / Gemcitabine D1,8 q21d Days with orders from any treatment category:  06/03/2014      SCHEDULING COMMUNICATION      prochlorperazine (COMPAZINE) tablet 10 mg      Gemcitabine HCl (GEMZAR) 1,178 mg in sodium chloride 0.9 % 100 mL chemo infusion      sodium chloride 0.9 % injection 10 mL      heparin lock flush 100 unit/mL      heparin lock flush 100 unit/mL      alteplase (CATHFLO ACTIVASE) injection 2 mg      sodium chloride 0.9 % injection 3 mL      Cold Pack 1 packet      0.9 %  sodium chloride infusion      TREATMENT CONDITIONS 06/17/2014      SCHEDULING COMMUNICATION      palonosetron (ALOXI) injection 0.25 mg      Dexamethasone Sodium Phosphate (DECADRON) injection 12 mg      fosaprepitant (EMEND) 150 mg in sodium chloride 0.9 % 145 mL IVPB      Gemcitabine HCl (GEMZAR) 1,178 mg in sodium  chloride 0.9 % 100 mL chemo infusion      CISplatin (PLATINOL) 88 mg in sodium chloride 0.9 % 250 mL chemo infusion      sodium chloride 0.9 % injection 10 mL      heparin lock flush 100 unit/mL      heparin lock flush 100 unit/mL      alteplase (CATHFLO ACTIVASE) injection 2 mg      sodium chloride 0.9 % injection 3 mL      Cold Pack 1 packet      0.9 %  sodium chloride infusion      dextrose 5 % and 0.45% NaCl 1,000 mL with potassium chloride 20 mEq, magnesium sulfate 12 mEq, mannitol 12.5 g infusion      TREATMENT CONDITIONS 06/24/2014      SCHEDULING COMMUNICATION      prochlorperazine (COMPAZINE) tablet 10 mg      Gemcitabine HCl (GEMZAR) 1,178 mg in sodium chloride 0.9 % 100 mL chemo infusion      sodium chloride 0.9 % injection 10 mL      heparin lock flush 100 unit/mL      heparin lock flush 100 unit/mL      alteplase (CATHFLO ACTIVASE) injection 2 mg  sodium chloride 0.9 % injection 3 mL      Cold Pack 1 packet      0.9 %  sodium chloride infusion      TREATMENT CONDITIONS    ROS  Past Medical History  Diagnosis Date  . HTN (hypertension)   . lung ca dx'd 07/2013    History reviewed. No pertinent past surgical history.  has HTN (hypertension); Tobacco use disorder; Cancer of right lung; Nausea with vomiting; Neuropathy due to chemotherapeutic drug; and Hearing loss on her problem list.     is allergic to hctz and plendil.    Medication List       This list is accurate as of: 05/27/14  9:55 AM.  Always use your most recent med list.               amLODipine 10 MG tablet  Commonly known as:  NORVASC  Take 10 mg by mouth every morning.     aspirin EC 81 MG tablet  Take 81 mg by mouth every morning.     calcium carbonate 500 MG chewable tablet  Commonly known as:  TUMS - dosed in mg elemental calcium  Chew 1 tablet by mouth 3 (three) times daily as needed for indigestion or heartburn.     lidocaine-prilocaine cream  Commonly known as:  EMLA  Apply  1 application topically as needed. Apply to port 1 hour before chemo appointment     lisinopril 20 MG tablet  Commonly known as:  PRINIVIL,ZESTRIL  Take 20 mg by mouth every morning.     magnesium oxide 400 (241.3 MG) MG tablet  Commonly known as:  MAG-OX  Take 1 tablet (400 mg total) by mouth 2 (two) times daily.     nebivolol 10 MG tablet  Commonly known as:  BYSTOLIC  Take 10 mg by mouth every morning.     oxyCODONE 5 MG immediate release tablet  Commonly known as:  ROXICODONE  Take 1 tablet (5 mg total) by mouth every 6 (six) hours as needed for severe pain.     prochlorperazine 10 MG tablet  Commonly known as:  COMPAZINE  Take 1 tablet (10 mg total) by mouth every 6 (six) hours as needed for nausea or vomiting.         PHYSICAL EXAMINATION  Blood pressure 142/66, pulse 68, temperature 97.8 F (36.6 C), temperature source Oral, resp. rate 18, height 5\' 2"  (1.575 m), weight 110 lb 14.4 oz (50.304 kg).  Physical Exam  Nursing note and vitals reviewed. Constitutional: She is oriented to person, place, and time and well-developed, well-nourished, and in no distress. No distress.  HENT:  Head: Normocephalic and atraumatic.  Mouth/Throat: Oropharynx is clear and moist. No oropharyngeal exudate.  History of hearing loss; the patient appears able to hear her and communicate fairly well.  Eyes: Conjunctivae and EOM are normal. Pupils are equal, round, and reactive to light. No scleral icterus.  Neck: Normal range of motion. Neck supple. No JVD present. No tracheal deviation present. No thyromegaly present.  Cardiovascular: Normal rate, regular rhythm, normal heart sounds and intact distal pulses.  Exam reveals no friction rub.   No murmur heard. Pulmonary/Chest: Effort normal and breath sounds normal. No respiratory distress. She has no wheezes.  Abdominal: Soft. Bowel sounds are normal. She exhibits no distension. There is no tenderness. There is no rebound.  Musculoskeletal:  Normal range of motion. She exhibits no edema and no tenderness.  Lymphadenopathy:    She has  no cervical adenopathy.  Neurological: She is alert and oriented to person, place, and time. Gait normal.  Skin: Skin is warm and dry. No rash noted. No erythema.  Psychiatric: Affect normal.    LABORATORY DATA:. CBC  Lab Results  Component Value Date   WBC 3.2* 05/27/2014   RBC 3.38* 05/27/2014   HGB 10.8* 05/27/2014   HCT 31.8* 05/27/2014   PLT 127* 05/27/2014   MCV 94.1 05/27/2014   MCH 32.0 05/27/2014   MCHC 34.0 05/27/2014   RDW 15.3* 05/27/2014   LYMPHSABS 1.1 05/27/2014   MONOABS 0.6 05/27/2014   EOSABS 0.0 05/27/2014   BASOSABS 0.0 05/27/2014     CMET  Lab Results  Component Value Date   NA 136 05/27/2014   K 4.6 05/27/2014   CL 96 05/13/2014   CO2 28 05/27/2014   GLUCOSE 101 05/27/2014   BUN 11.1 05/27/2014   CREATININE 1.0 05/27/2014   CALCIUM 10.1 05/27/2014   PROT 6.6 05/27/2014   ALBUMIN 3.5 05/27/2014   AST 20 05/27/2014   ALT 14 05/27/2014   ALKPHOS 99 05/27/2014   BILITOT 0.31 05/27/2014      RADIOGRAPHIC STUDIES: CT Abdomen Pelvis W Contrast Status: Final result         PACS Images    Show images for CT Abdomen Pelvis W Contrast         Study Result    CLINICAL DATA: Followup of metastatic lung cancer. Chemotherapy in  progress.  EXAM:  CT CHEST, ABDOMEN AND PELVIS WITHOUT CONTRAST  TECHNIQUE:  Multidetector CT imaging of the chest, abdomen and pelvis was  performed following the standard protocol without IV contrast.  COMPARISON: 03/23/2014  FINDINGS:  CT CHEST FINDINGS  Lungs/Pleura: Secretions in dependent right mainstem bronchus. Lower  lobe predominant bronchial wall thickening with mild to moderate  centrilobular emphysema. Biapical pleural parenchymal scarring.  Similar scattered tiny pulmonary densities, likely areas of  scarring. Spiculated central right lower lobe lung nodule measures  1.4 x 1.4 cm on image 13 and is not significantly changed.  Probable  satellite nodule just inferiorly and laterally measures 5 mm on  image 35.  Trace right-sided pleural fluid is not significantly changed.  Heart/Mediastinum: No supraclavicular adenopathy. Right-sided  Port-A-Cath which terminates at the Mid right atrium. Aortic and  branch vessel atherosclerosis. Normal heart size, without  pericardial effusion. No central pulmonary embolism, on this  non-dedicated study. No mediastinal or hilar adenopathy. Small  hiatal hernia.  CT ABDOMEN AND PELVIS FINDINGS  Hepatobiliary: Atypical hepatic morphology, most consistent with  treated metastasis. Index posterior segment right liver lobe lesion  measures 2.9 x 2.9 cm today versus 3.0 x 3.7 cm on the prior.  Lateral segment left liver lobe index lesion measures 1.8 x 1.7 cm  on image 51 versus 2.4 x 1.8 cm on the prior.  Subcapsular right liver lobe lesion measures 2.7 x 2.3 cm on image  51 versus 2.8 x 2.9 cm on the prior.  No new liver lesions identified.  Normal gallbladder, without biliary ductal dilatation.  Spleen: Normal  Pancreas: Normal, without mass or pancreatic ductal dilatation.  Stomach/Bowel: Normal distal stomach. Normal colon, appendix, and  terminal ileum. Normal small bowel without abdominal ascites. No  evidence of omental or peritoneal disease.  Urinary tract: Mild left adrenal nodularity is unchanged. Normal  right adrenal gland. Normal kidneys, without hydronephrosis. Normal  urinary bladder.  Vascular/Lymphatic: Aortic and branch vessel atherosclerosis. No  retroperitoneal or retrocrural adenopathy. No pelvic adenopathy.  Reproductive: Pelvic floor laxity. Dominant right pelvic soft tissue  mass measures 6.0 x 5.0 cm and is unchanged. 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 No significant free fluid.  Musculoskeletal: Degenerative disc disease at the lumbosacral  junction.  Other: None  IMPRESSION:  CT CHEST IMPRESSION  1. Similar size of a right lower lobe lung nodule.    2. No evidence of progressive disease within the chest.  3. Similar right pleural effusion.  CT ABDOMEN AND PELVIS IMPRESSION  1. Slight improvement in hepatic metastasis.  2. No evidence of extrahepatic metastasis within the abdomen or  pelvis.  3. Advanced atherosclerosis.  Electronically Signed  By: Abigail Miyamoto M.D.  On: 05/24/2014 09:02   ASSESSMENT/PLAN:    Cancer of right lung  Assessment & Plan Patient's blood counts of improved; and are currently stable.  Patient obtained a restaging scan earlier this week which revealed stable lung nodules and slight improvement in hepatic metastasis.  No new evidence of disease.  Patient will proceed today with cycle 10 of her cisplatin/gemcitabine chemotherapy regimen.  She has plans to return in one week on 06/03/2014 for cycle 10, day 8 of the gemcitabine only portion of her chemotherapy.  We'll plan on completing an additional 3 cycles of chemotherapy prior to another restaging scan.   Nausea with vomiting  Assessment & Plan Chemotherapy-associated nausea/vomiting but does appear under much better control.  Patient states she doesn't need any refills of her anti-nausea medication or this time.   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   Hearing loss  Assessment & Plan Patient has had some chronic hearing loss since initiating cisplatin chemotherapy.  Cisplatin dose has been greatly reduced due to the hearing loss.  Patient states that she has been to an audiologist for evaluation as well.   Patient stated understanding of all instructions; and was in agreement with this plan of care. The patient knows to call the clinic with any problems, questions or concerns.   This was a shared visit with Dr. Julien Nordmann today.    Total time spent with patient was 25  minutes;  with greater than 75  percent of that time spent in face to  face counseling regarding her symptoms, review of her restaging scans,  and coordination of care and follow up.  Disclaimer: This note was dictated with voice recognition software. Similar sounding words can inadvertently be transcribed and may not be corrected upon review.   Drue Second, NP 05/27/2014   ADDENDUM: Hematology/Oncology Attending: I had a face to face encounter with the patient. I recommended her care plan. 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 She is currently on treatment with reduced dose cisplatin and gemcitabine status post 9 cycles. She has been tolerating her treatment fairly well except for the mild peripheral neuropathy. Her recent CT scan of the chest, abdomen and pelvis showed stable disease. I discussed the scan results with the patient and her husband. I recommended for her to continue her current treatment with reduced dose cisplatin and gemcitabine. She would proceed with cycle #2 today as scheduled. The patient would come back for followup visit in 3 weeks with the next cycle of her treatment. She was advised to call immediately if she has any concerning symptoms in the interval.  Disclaimer: This note was dictated with voice recognition software. Similar sounding  words can inadvertently be transcribed and may be missed upon review. Eilleen Kempf., MD 05/28/2014

## 2014-05-27 NOTE — Progress Notes (Signed)
Patient's labs within tx parameters per Erasmo Downer RN with Selena Lesser NP. Reviewed labs with pharmacy when system was able to produce results. Patient has no questions/concerns prior to tx.

## 2014-05-27 NOTE — Assessment & Plan Note (Signed)
Patient has had some chronic hearing loss since initiating cisplatin chemotherapy.  Cisplatin dose has been greatly reduced due to the hearing loss.  Patient states that she has been to an audiologist for evaluation as well.

## 2014-05-27 NOTE — Telephone Encounter (Signed)
Per staff message and POF I have scheduled appts. Advised scheduler of appts. JMW  

## 2014-05-27 NOTE — Assessment & Plan Note (Signed)
Patient's blood counts of improved; and are currently stable.  Patient obtained a restaging scan earlier this week which revealed stable lung nodules and slight improvement in hepatic metastasis.  No new evidence of disease.  Patient will proceed today with cycle 10 of her cisplatin/gemcitabine chemotherapy regimen.  She has plans to return in one week on 06/03/2014 for cycle 10, day 8 of the gemcitabine only portion of her chemotherapy.  We'll plan on completing an additional 3 cycles of chemotherapy prior to another restaging scan.

## 2014-05-27 NOTE — Patient Instructions (Signed)
[facility] Discharge Instructions for Patients Receiving Chemotherapy  Today you received the following chemotherapy agents: Gemzar, Cisplatin.  To help prevent nausea and vomiting after your treatment, we encourage you to take your nausea medication as prescribed by your physician.    If you develop nausea and vomiting that is not controlled by your nausea medication, call the clinic.   BELOW ARE SYMPTOMS THAT SHOULD BE REPORTED IMMEDIATELY:  *FEVER GREATER THAN 100.5 F  *CHILLS WITH OR WITHOUT FEVER  NAUSEA AND VOMITING THAT IS NOT CONTROLLED WITH YOUR NAUSEA MEDICATION  *UNUSUAL SHORTNESS OF BREATH  *UNUSUAL BRUISING OR BLEEDING  TENDERNESS IN MOUTH AND THROAT WITH OR WITHOUT PRESENCE OF ULCERS  *URINARY PROBLEMS  *BOWEL PROBLEMS  UNUSUAL RASH Items with * indicate a potential emergency and should be followed up as soon as possible.  Feel free to call the clinic you have any questions or concerns. The clinic phone number is [phone].

## 2014-05-27 NOTE — Assessment & Plan Note (Signed)
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

## 2014-05-27 NOTE — Assessment & Plan Note (Signed)
Chemotherapy-associated nausea/vomiting but does appear under much better control.  Patient states she doesn't need any refills of her anti-nausea medication or this time.

## 2014-05-27 NOTE — Patient Instructions (Signed)

## 2014-05-27 NOTE — Telephone Encounter (Signed)
, °

## 2014-06-03 ENCOUNTER — Ambulatory Visit (HOSPITAL_BASED_OUTPATIENT_CLINIC_OR_DEPARTMENT_OTHER): Payer: 59 | Admitting: Nurse Practitioner

## 2014-06-03 ENCOUNTER — Telehealth: Payer: Self-pay | Admitting: Internal Medicine

## 2014-06-03 ENCOUNTER — Other Ambulatory Visit (HOSPITAL_BASED_OUTPATIENT_CLINIC_OR_DEPARTMENT_OTHER): Payer: 59

## 2014-06-03 ENCOUNTER — Encounter: Payer: Self-pay | Admitting: *Deleted

## 2014-06-03 ENCOUNTER — Ambulatory Visit: Payer: 59

## 2014-06-03 ENCOUNTER — Other Ambulatory Visit: Payer: 59

## 2014-06-03 ENCOUNTER — Ambulatory Visit (HOSPITAL_BASED_OUTPATIENT_CLINIC_OR_DEPARTMENT_OTHER): Payer: 59

## 2014-06-03 ENCOUNTER — Telehealth: Payer: Self-pay | Admitting: Nurse Practitioner

## 2014-06-03 VITALS — BP 126/70 | HR 72 | Temp 97.7°F | Resp 18 | Ht 62.0 in | Wt 110.2 lb

## 2014-06-03 DIAGNOSIS — C787 Secondary malignant neoplasm of liver and intrahepatic bile duct: Secondary | ICD-10-CM

## 2014-06-03 DIAGNOSIS — R112 Nausea with vomiting, unspecified: Secondary | ICD-10-CM

## 2014-06-03 DIAGNOSIS — Z5111 Encounter for antineoplastic chemotherapy: Secondary | ICD-10-CM

## 2014-06-03 DIAGNOSIS — C801 Malignant (primary) neoplasm, unspecified: Secondary | ICD-10-CM

## 2014-06-03 DIAGNOSIS — C221 Intrahepatic bile duct carcinoma: Secondary | ICD-10-CM

## 2014-06-03 DIAGNOSIS — T451X5A Adverse effect of antineoplastic and immunosuppressive drugs, initial encounter: Secondary | ICD-10-CM

## 2014-06-03 DIAGNOSIS — C3491 Malignant neoplasm of unspecified part of right bronchus or lung: Secondary | ICD-10-CM

## 2014-06-03 DIAGNOSIS — Z95828 Presence of other vascular implants and grafts: Secondary | ICD-10-CM

## 2014-06-03 DIAGNOSIS — H9193 Unspecified hearing loss, bilateral: Secondary | ICD-10-CM

## 2014-06-03 DIAGNOSIS — G620 Drug-induced polyneuropathy: Secondary | ICD-10-CM

## 2014-06-03 LAB — COMPREHENSIVE METABOLIC PANEL (CC13)
ALT: 51 U/L (ref 0–55)
ANION GAP: 7 meq/L (ref 3–11)
AST: 24 U/L (ref 5–34)
Albumin: 3.5 g/dL (ref 3.5–5.0)
Alkaline Phosphatase: 101 U/L (ref 40–150)
BILIRUBIN TOTAL: 0.26 mg/dL (ref 0.20–1.20)
BUN: 13.1 mg/dL (ref 7.0–26.0)
CALCIUM: 9.7 mg/dL (ref 8.4–10.4)
CHLORIDE: 100 meq/L (ref 98–109)
CO2: 28 mEq/L (ref 22–29)
CREATININE: 0.9 mg/dL (ref 0.6–1.1)
GLUCOSE: 95 mg/dL (ref 70–140)
Potassium: 4.5 mEq/L (ref 3.5–5.1)
Sodium: 134 mEq/L — ABNORMAL LOW (ref 136–145)
Total Protein: 6.6 g/dL (ref 6.4–8.3)

## 2014-06-03 LAB — CBC WITH DIFFERENTIAL/PLATELET
BASO%: 0.3 % (ref 0.0–2.0)
Basophils Absolute: 0 10*3/uL (ref 0.0–0.1)
EOS%: 0.1 % (ref 0.0–7.0)
Eosinophils Absolute: 0 10*3/uL (ref 0.0–0.5)
HCT: 31.4 % — ABNORMAL LOW (ref 34.8–46.6)
HEMOGLOBIN: 10.5 g/dL — AB (ref 11.6–15.9)
LYMPH%: 40.3 % (ref 14.0–49.7)
MCH: 31.6 pg (ref 25.1–34.0)
MCHC: 33.6 g/dL (ref 31.5–36.0)
MCV: 94 fL (ref 79.5–101.0)
MONO#: 0.2 10*3/uL (ref 0.1–0.9)
MONO%: 5.7 % (ref 0.0–14.0)
NEUT#: 1.8 10*3/uL (ref 1.5–6.5)
NEUT%: 53.6 % (ref 38.4–76.8)
PLATELETS: 166 10*3/uL (ref 145–400)
RBC: 3.34 10*6/uL — ABNORMAL LOW (ref 3.70–5.45)
RDW: 16.2 % — ABNORMAL HIGH (ref 11.2–14.5)
WBC: 3.4 10*3/uL — AB (ref 3.9–10.3)
lymph#: 1.4 10*3/uL (ref 0.9–3.3)

## 2014-06-03 LAB — MAGNESIUM (CC13): Magnesium: 2.1 mg/dl (ref 1.5–2.5)

## 2014-06-03 MED ORDER — GEMCITABINE HCL CHEMO INJECTION 1 GM/26.3ML
800.0000 mg/m2 | Freq: Once | INTRAVENOUS | Status: AC
  Administered 2014-06-03: 1178 mg via INTRAVENOUS
  Filled 2014-06-03: qty 30.98

## 2014-06-03 MED ORDER — SODIUM CHLORIDE 0.9 % IJ SOLN
10.0000 mL | INTRAMUSCULAR | Status: DC | PRN
  Administered 2014-06-03: 10 mL via INTRAVENOUS
  Filled 2014-06-03: qty 10

## 2014-06-03 MED ORDER — HEPARIN SOD (PORK) LOCK FLUSH 100 UNIT/ML IV SOLN
500.0000 [IU] | Freq: Once | INTRAVENOUS | Status: AC | PRN
  Administered 2014-06-03: 500 [IU]
  Filled 2014-06-03: qty 5

## 2014-06-03 MED ORDER — SODIUM CHLORIDE 0.9 % IV SOLN
Freq: Once | INTRAVENOUS | Status: AC
  Administered 2014-06-03: 11:00:00 via INTRAVENOUS

## 2014-06-03 MED ORDER — PROCHLORPERAZINE MALEATE 10 MG PO TABS
10.0000 mg | ORAL_TABLET | Freq: Once | ORAL | Status: AC
  Administered 2014-06-03: 10 mg via ORAL

## 2014-06-03 MED ORDER — PROCHLORPERAZINE MALEATE 10 MG PO TABS
ORAL_TABLET | ORAL | Status: AC
  Filled 2014-06-03: qty 1

## 2014-06-03 MED ORDER — SODIUM CHLORIDE 0.9 % IJ SOLN
10.0000 mL | INTRAMUSCULAR | Status: DC | PRN
  Administered 2014-06-03: 10 mL
  Filled 2014-06-03: qty 10

## 2014-06-03 NOTE — Progress Notes (Signed)
I received a phone call from chemo infusion room that Amber Hayden needed to see me.  I went to speak with her.  The husband was with patient and he voiced concerns about scheduling. I listened as he explained his frustration.  I went to clarify schedule with lead scheduler, Melissa.  She was able to clarify and I returned back to patient and husband.  I updated them on schedule.  They also wanted to have appoints past Nov. 12 but due to policy, this can't be done.  Again I explained.  I listened as husband was able to vent some frustrations about scheduling.  We had good discussion and he is aware of policy and thankful for the schedule update.

## 2014-06-03 NOTE — Patient Instructions (Signed)

## 2014-06-03 NOTE — Patient Instructions (Signed)
[facility] Discharge Instructions for Patients Receiving Chemotherapy  Today you received the following chemotherapy agents Gemcitabine.   To help prevent nausea and vomiting after your treatment, we encourage you to take your nausea medication as directed.    If you develop nausea and vomiting that is not controlled by your nausea medication, call the clinic.   BELOW ARE SYMPTOMS THAT SHOULD BE REPORTED IMMEDIATELY:  *FEVER GREATER THAN 100.5 F  *CHILLS WITH OR WITHOUT FEVER  NAUSEA AND VOMITING THAT IS NOT CONTROLLED WITH YOUR NAUSEA MEDICATION  *UNUSUAL SHORTNESS OF BREATH  *UNUSUAL BRUISING OR BLEEDING  TENDERNESS IN MOUTH AND THROAT WITH OR WITHOUT PRESENCE OF ULCERS  *URINARY PROBLEMS  *BOWEL PROBLEMS  UNUSUAL RASH Items with * indicate a potential emergency and should be followed up as soon as possible.  Feel free to call the clinic you have any questions or concerns. The clinic phone number is [phone].

## 2014-06-03 NOTE — Telephone Encounter (Signed)
, °

## 2014-06-03 NOTE — Telephone Encounter (Signed)
added tx for 11/12 and 11/19. per dana pt wanted to try to avoid holiday crunch. new schedule printed and given to pt by dana.

## 2014-06-04 ENCOUNTER — Encounter: Payer: Self-pay | Admitting: Nurse Practitioner

## 2014-06-04 NOTE — Assessment & Plan Note (Signed)
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

## 2014-06-04 NOTE — Assessment & Plan Note (Signed)
Patient has some chronic hearing loss since initiating cisplatin chemotherapy.  Cisplatin  dose has been greatly reduced due to the hearing loss.  Patient states she has been to known allergens for evaluation of her hearing loss recently.

## 2014-06-04 NOTE — Progress Notes (Signed)
[place]   Chief Complaint  Patient presents with  . Follow-up    HPI: Amber Hayden 63 y.o. female diagnosed with lung cancer.  Currently undergoing cisplatin/gemcitabine chemotherapy regimen.  Patient presents to the [facility] today for followup and to receive cycle 10, day 8 of her chemotherapy.  She had a restaging scan earlier this week.  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  Patient states that her nausea is much better under control so she started taking Prilosec every morning. She also has some chronic hearing loss of from previous cisplatin use.  Cisplatin has been decreased due to issues with previous hearing loss.  Also, patient states she has been evaluated by an audiologist for hearing loss as well.   Patient denies any other symptoms whatsoever.  She denies any recent fevers or chills.     HPI  CURRENT THERAPY: Upcoming Treatment Dates - LUNG Cisplatin D1 / Gemcitabine D1,8 q21d Days with orders from any treatment category:  06/03/2014      SCHEDULING COMMUNICATION      prochlorperazine (COMPAZINE) tablet 10 mg      Gemcitabine HCl (GEMZAR) 1,178 mg in sodium chloride 0.9 % 100 mL chemo infusion      sodium chloride 0.9 % injection 10 mL      heparin lock flush 100 unit/mL      heparin lock flush 100 unit/mL      alteplase (CATHFLO ACTIVASE) injection 2 mg      sodium chloride 0.9 % injection 3 mL      Cold Pack 1 packet      0.9 %  sodium chloride infusion      TREATMENT CONDITIONS 06/17/2014      SCHEDULING COMMUNICATION      palonosetron (ALOXI) injection 0.25 mg      Dexamethasone Sodium Phosphate (DECADRON) injection 12 mg      fosaprepitant (EMEND) 150 mg in sodium chloride 0.9 % 145 mL IVPB      Gemcitabine HCl (GEMZAR) 1,178 mg in sodium chloride 0.9 % 100 mL chemo infusion      CISplatin (PLATINOL) 88 mg in  sodium chloride 0.9 % 250 mL chemo infusion      sodium chloride 0.9 % injection 10 mL      heparin lock flush 100 unit/mL      heparin lock flush 100 unit/mL      alteplase (CATHFLO ACTIVASE) injection 2 mg      sodium chloride 0.9 % injection 3 mL      Cold Pack 1 packet      0.9 %  sodium chloride infusion      dextrose 5 % and 0.45% NaCl 1,000 mL with potassium chloride 20 mEq, magnesium sulfate 12 mEq, mannitol 12.5 g infusion      TREATMENT CONDITIONS 06/24/2014      SCHEDULING COMMUNICATION      prochlorperazine (COMPAZINE) tablet 10 mg      Gemcitabine HCl (GEMZAR) 1,178 mg in sodium chloride 0.9 % 100 mL chemo infusion      sodium chloride 0.9 % injection 10 mL      heparin lock flush 100 unit/mL      heparin lock flush 100 unit/mL      alteplase (CATHFLO ACTIVASE) injection 2 mg      sodium chloride 0.9 % injection 3 mL  Cold Pack 1 packet      0.9 %  sodium chloride infusion      TREATMENT CONDITIONS    ROS  Past Medical History  Diagnosis Date  . HTN (hypertension)   . lung ca dx'd 07/2013    History reviewed. No pertinent past surgical history.  has HTN (hypertension); Tobacco use disorder; Cancer of right lung; Nausea with vomiting; Neuropathy due to chemotherapeutic drug; and Hearing loss on her problem list.     is allergic to hctz and plendil.    Medication List       This list is accurate as of: 06/03/14 11:59 PM.  Always use your most recent med list.               amLODipine 10 MG tablet  Commonly known as:  NORVASC  Take 10 mg by mouth every morning.     aspirin EC 81 MG tablet  Take 81 mg by mouth every morning.     calcium carbonate 500 MG chewable tablet  Commonly known as:  TUMS - dosed in mg elemental calcium  Chew 1 tablet by mouth 3 (three) times daily as needed for indigestion or heartburn.     lidocaine-prilocaine cream  Commonly known as:  EMLA  Apply 1 application topically as needed. Apply to port 1 hour before chemo  appointment     lisinopril 20 MG tablet  Commonly known as:  PRINIVIL,ZESTRIL  Take 20 mg by mouth every morning.     magnesium oxide 400 (241.3 MG) MG tablet  Commonly known as:  MAG-OX  Take 1 tablet (400 mg total) by mouth 2 (two) times daily.     nebivolol 10 MG tablet  Commonly known as:  BYSTOLIC  Take 10 mg by mouth every morning.     omeprazole 40 MG capsule  Commonly known as:  PRILOSEC  Take 40 mg by mouth daily.     oxyCODONE 5 MG immediate release tablet  Commonly known as:  ROXICODONE  Take 1 tablet (5 mg total) by mouth every 6 (six) hours as needed for severe pain.     prochlorperazine 10 MG tablet  Commonly known as:  COMPAZINE  Take 1 tablet (10 mg total) by mouth every 6 (six) hours as needed for nausea or vomiting.         PHYSICAL EXAMINATION  Blood pressure 126/70, pulse 72, temperature 97.7 F (36.5 C), temperature source Oral, resp. rate 18, height 5\' 2"  (1.575 m), weight 110 lb 3.2 oz (49.986 kg).  Physical Exam  Nursing note and vitals reviewed. Constitutional: She is oriented to person, place, and time and well-developed, well-nourished, and in no distress. No distress.  HENT:  Head: Normocephalic and atraumatic.  Mouth/Throat: Oropharynx is clear and moist. No oropharyngeal exudate.  History of hearing loss; the patient appears able to hear her and communicate fairly well.  Eyes: Conjunctivae and EOM are normal. Pupils are equal, round, and reactive to light. No scleral icterus.  Neck: Normal range of motion. Neck supple. No JVD present. No tracheal deviation present. No thyromegaly present.  Cardiovascular: Normal rate, regular rhythm, normal heart sounds and intact distal pulses.  Exam reveals no friction rub.   No murmur heard. Pulmonary/Chest: Effort normal and breath sounds normal. No respiratory distress. She has no wheezes.  Abdominal: Soft. Bowel sounds are normal. She exhibits no distension. There is no tenderness. There is no rebound.   Musculoskeletal: Normal range of motion. She exhibits no edema and no  tenderness.  Lymphadenopathy:    She has no cervical adenopathy.  Neurological: She is alert and oriented to person, place, and time. Gait normal.  Skin: Skin is warm and dry. No rash noted. No erythema.  Psychiatric: Affect normal.    LABORATORY DATA:. CBC  Lab Results  Component Value Date   WBC 3.4* 06/03/2014   RBC 3.34* 06/03/2014   HGB 10.5* 06/03/2014   HCT 31.4* 06/03/2014   PLT 166 06/03/2014   MCV 94.0 06/03/2014   MCH 31.6 06/03/2014   MCHC 33.6 06/03/2014   RDW 16.2* 06/03/2014   LYMPHSABS 1.4 06/03/2014   MONOABS 0.2 06/03/2014   EOSABS 0.0 06/03/2014   BASOSABS 0.0 06/03/2014     CMET  Lab Results  Component Value Date   NA 134* 06/03/2014   K 4.5 06/03/2014   CL 96 05/13/2014   CO2 28 06/03/2014   GLUCOSE 95 06/03/2014   BUN 13.1 06/03/2014   CREATININE 0.9 06/03/2014   CALCIUM 9.7 06/03/2014   PROT 6.6 06/03/2014   ALBUMIN 3.5 06/03/2014   AST 24 06/03/2014   ALT 51 06/03/2014   ALKPHOS 101 06/03/2014   BILITOT 0.26 06/03/2014      RADIOGRAPHIC STUDIES: CT Abdomen Pelvis W Contrast Status: Final result         PACS Images    Show images for CT Abdomen Pelvis W Contrast         Study Result    CLINICAL DATA: Followup of metastatic lung cancer. Chemotherapy in  progress.  EXAM:  CT CHEST, ABDOMEN AND PELVIS WITHOUT CONTRAST  TECHNIQUE:  Multidetector CT imaging of the chest, abdomen and pelvis was  performed following the standard protocol without IV contrast.  COMPARISON: 03/23/2014  FINDINGS:  CT CHEST FINDINGS  Lungs/Pleura: Secretions in dependent right mainstem bronchus. Lower  lobe predominant bronchial wall thickening with mild to moderate  centrilobular emphysema. Biapical pleural parenchymal scarring.  Similar scattered tiny pulmonary densities, likely areas of  scarring. Spiculated central right lower lobe lung nodule measures  1.4 x 1.4 cm on image 13 and is not  significantly changed. Probable  satellite nodule just inferiorly and laterally measures 5 mm on  image 35.  Trace right-sided pleural fluid is not significantly changed.  Heart/Mediastinum: No supraclavicular adenopathy. Right-sided  Port-A-Cath which terminates at the Mid right atrium. Aortic and  branch vessel atherosclerosis. Normal heart size, without  pericardial effusion. No central pulmonary embolism, on this  non-dedicated study. No mediastinal or hilar adenopathy. Small  hiatal hernia.  CT ABDOMEN AND PELVIS FINDINGS  Hepatobiliary: Atypical hepatic morphology, most consistent with  treated metastasis. Index posterior segment right liver lobe lesion  measures 2.9 x 2.9 cm today versus 3.0 x 3.7 cm on the prior.  Lateral segment left liver lobe index lesion measures 1.8 x 1.7 cm  on image 51 versus 2.4 x 1.8 cm on the prior.  Subcapsular right liver lobe lesion measures 2.7 x 2.3 cm on image  51 versus 2.8 x 2.9 cm on the prior.  No new liver lesions identified.  Normal gallbladder, without biliary ductal dilatation.  Spleen: Normal  Pancreas: Normal, without mass or pancreatic ductal dilatation.  Stomach/Bowel: Normal distal stomach. Normal colon, appendix, and  terminal ileum. Normal small bowel without abdominal ascites. No  evidence of omental or peritoneal disease.  Urinary tract: Mild left adrenal nodularity is unchanged. Normal  right adrenal gland. Normal kidneys, without hydronephrosis. Normal  urinary bladder.  Vascular/Lymphatic: Aortic and branch vessel atherosclerosis. No  retroperitoneal or retrocrural adenopathy. No pelvic adenopathy.  Reproductive: Pelvic floor laxity. Dominant right pelvic soft tissue  mass measures 6.0 x 5.0 cm and is unchanged. 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 No significant free fluid.  Musculoskeletal: Degenerative disc disease at the lumbosacral  junction.  Other: None  IMPRESSION:  CT CHEST IMPRESSION  1. Similar size of a right  lower lobe lung nodule.  2. No evidence of progressive disease within the chest.  3. Similar right pleural effusion.  CT ABDOMEN AND PELVIS IMPRESSION  1. Slight improvement in hepatic metastasis.  2. No evidence of extrahepatic metastasis within the abdomen or  pelvis.  3. Advanced atherosclerosis.  Electronically Signed  By: Abigail Miyamoto M.D.  On: 05/24/2014 09:02   ASSESSMENT/PLAN:    Cancer of right lung  Assessment & Plan Reviewed all lab results with patient and her husband in detail today.  Patient appears to be tolerating her chemotherapy fairly well.  Restaging scan obtained recently revealed stable lung nodules and slight improvement in hepatic metastasis.  No new evidence of disease.  Patient will proceed today with cycle 10, day 8 of her cisplatin/gemcitabine chemotherapy regimen.  She will return on 06/17/2014 for cycle 11, day 1 of the same regimen.   Nausea with vomiting  Assessment & Plan Chemotherapy-associated nausea/vomiting but does appear much improved.  Patient states she's only vomited once within this past week or so.  She states she has started taking Prilosec every morning.  Patient states she R. Reece Levy has anti-nausea medications at home to use if she needs.   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   Hearing loss  Assessment & Plan Patient has some chronic hearing loss since initiating cisplatin chemotherapy.  Cisplatin  dose has been greatly reduced due to the hearing loss.  Patient states she has been to known allergens for evaluation of her hearing loss recently.    Patient stated understanding of all instructions; and was in agreement with this plan of care. The patient knows to call the clinic with any problems, questions or concerns.   Patient's case was discussed and reviewed with Dr. Julien Nordmann in detail.   Total time spent with patient was  25  minutes;  with greater than 75  percent of that time spent in face to face counseling regarding her symptoms, review of her restaging scans,  and coordination of care and follow up.  Disclaimer: This note was dictated with voice recognition software. Similar sounding words can inadvertently be transcribed and may not be corrected upon review.   Drue Second, NP 06/04/2014

## 2014-06-04 NOTE — Assessment & Plan Note (Signed)
Reviewed all lab results with patient and her husband in detail today.  Patient appears to be tolerating her chemotherapy fairly well.  Restaging scan obtained recently revealed stable lung nodules and slight improvement in hepatic metastasis.  No new evidence of disease.  Patient will proceed today with cycle 10, day 8 of her cisplatin/gemcitabine chemotherapy regimen.  She will return on 06/17/2014 for cycle 11, day 1 of the same regimen.

## 2014-06-04 NOTE — Assessment & Plan Note (Signed)
Chemotherapy-associated nausea/vomiting but does appear much improved.  Patient states she's only vomited once within this past week or so.  She states she has started taking Prilosec every morning.  Patient states she R. Reece Levy has anti-nausea medications at home to use if she needs.

## 2014-06-17 ENCOUNTER — Ambulatory Visit: Payer: 59

## 2014-06-17 ENCOUNTER — Telehealth: Payer: Self-pay | Admitting: *Deleted

## 2014-06-17 ENCOUNTER — Ambulatory Visit (HOSPITAL_BASED_OUTPATIENT_CLINIC_OR_DEPARTMENT_OTHER): Payer: 59 | Admitting: Internal Medicine

## 2014-06-17 ENCOUNTER — Ambulatory Visit (HOSPITAL_BASED_OUTPATIENT_CLINIC_OR_DEPARTMENT_OTHER): Payer: 59

## 2014-06-17 ENCOUNTER — Other Ambulatory Visit: Payer: 59

## 2014-06-17 ENCOUNTER — Other Ambulatory Visit (HOSPITAL_BASED_OUTPATIENT_CLINIC_OR_DEPARTMENT_OTHER): Payer: 59

## 2014-06-17 ENCOUNTER — Telehealth: Payer: Self-pay | Admitting: Internal Medicine

## 2014-06-17 ENCOUNTER — Encounter: Payer: Self-pay | Admitting: Internal Medicine

## 2014-06-17 VITALS — BP 131/56 | HR 75 | Temp 98.3°F | Resp 18 | Ht 62.0 in | Wt 112.9 lb

## 2014-06-17 DIAGNOSIS — C3431 Malignant neoplasm of lower lobe, right bronchus or lung: Secondary | ICD-10-CM

## 2014-06-17 DIAGNOSIS — C3491 Malignant neoplasm of unspecified part of right bronchus or lung: Secondary | ICD-10-CM

## 2014-06-17 DIAGNOSIS — Z95828 Presence of other vascular implants and grafts: Secondary | ICD-10-CM

## 2014-06-17 DIAGNOSIS — T451X5A Adverse effect of antineoplastic and immunosuppressive drugs, initial encounter: Principal | ICD-10-CM

## 2014-06-17 DIAGNOSIS — I10 Essential (primary) hypertension: Secondary | ICD-10-CM

## 2014-06-17 DIAGNOSIS — G620 Drug-induced polyneuropathy: Secondary | ICD-10-CM

## 2014-06-17 DIAGNOSIS — C801 Malignant (primary) neoplasm, unspecified: Secondary | ICD-10-CM

## 2014-06-17 DIAGNOSIS — C787 Secondary malignant neoplasm of liver and intrahepatic bile duct: Secondary | ICD-10-CM

## 2014-06-17 DIAGNOSIS — H9190 Unspecified hearing loss, unspecified ear: Secondary | ICD-10-CM

## 2014-06-17 DIAGNOSIS — Z5111 Encounter for antineoplastic chemotherapy: Secondary | ICD-10-CM

## 2014-06-17 DIAGNOSIS — C221 Intrahepatic bile duct carcinoma: Secondary | ICD-10-CM

## 2014-06-17 DIAGNOSIS — R112 Nausea with vomiting, unspecified: Secondary | ICD-10-CM

## 2014-06-17 LAB — CBC WITH DIFFERENTIAL/PLATELET
BASO%: 0.6 % (ref 0.0–2.0)
Basophils Absolute: 0 10*3/uL (ref 0.0–0.1)
EOS%: 0.5 % (ref 0.0–7.0)
Eosinophils Absolute: 0 10*3/uL (ref 0.0–0.5)
HCT: 27 % — ABNORMAL LOW (ref 34.8–46.6)
HGB: 9.1 g/dL — ABNORMAL LOW (ref 11.6–15.9)
LYMPH#: 1.2 10*3/uL (ref 0.9–3.3)
LYMPH%: 36.7 % (ref 14.0–49.7)
MCH: 31.7 pg (ref 25.1–34.0)
MCHC: 33.6 g/dL (ref 31.5–36.0)
MCV: 94.4 fL (ref 79.5–101.0)
MONO#: 0.6 10*3/uL (ref 0.1–0.9)
MONO%: 17.1 % — ABNORMAL HIGH (ref 0.0–14.0)
NEUT#: 1.5 10*3/uL (ref 1.5–6.5)
NEUT%: 45.1 % (ref 38.4–76.8)
Platelets: 123 10*3/uL — ABNORMAL LOW (ref 145–400)
RBC: 2.86 10*6/uL — ABNORMAL LOW (ref 3.70–5.45)
RDW: 16.8 % — ABNORMAL HIGH (ref 11.2–14.5)
WBC: 3.3 10*3/uL — AB (ref 3.9–10.3)

## 2014-06-17 LAB — COMPREHENSIVE METABOLIC PANEL (CC13)
ALT: 16 U/L (ref 0–55)
ANION GAP: 9 meq/L (ref 3–11)
AST: 17 U/L (ref 5–34)
Albumin: 3.3 g/dL — ABNORMAL LOW (ref 3.5–5.0)
Alkaline Phosphatase: 100 U/L (ref 40–150)
BUN: 15.2 mg/dL (ref 7.0–26.0)
CALCIUM: 9.4 mg/dL (ref 8.4–10.4)
CO2: 25 meq/L (ref 22–29)
CREATININE: 1 mg/dL (ref 0.6–1.1)
Chloride: 102 mEq/L (ref 98–109)
Glucose: 132 mg/dl (ref 70–140)
POTASSIUM: 3.9 meq/L (ref 3.5–5.1)
Sodium: 136 mEq/L (ref 136–145)
Total Bilirubin: 0.3 mg/dL (ref 0.20–1.20)
Total Protein: 6.2 g/dL — ABNORMAL LOW (ref 6.4–8.3)

## 2014-06-17 LAB — MAGNESIUM (CC13): Magnesium: 2.3 mg/dl (ref 1.5–2.5)

## 2014-06-17 MED ORDER — SODIUM CHLORIDE 0.9 % IV SOLN
800.0000 mg/m2 | Freq: Once | INTRAVENOUS | Status: AC
  Administered 2014-06-17: 1178 mg via INTRAVENOUS
  Filled 2014-06-17: qty 30.98

## 2014-06-17 MED ORDER — PALONOSETRON HCL INJECTION 0.25 MG/5ML
INTRAVENOUS | Status: AC
  Filled 2014-06-17: qty 5

## 2014-06-17 MED ORDER — SODIUM CHLORIDE 0.9 % IV SOLN
60.0000 mg/m2 | Freq: Once | INTRAVENOUS | Status: AC
  Administered 2014-06-17: 88 mg via INTRAVENOUS
  Filled 2014-06-17: qty 88

## 2014-06-17 MED ORDER — DULOXETINE HCL 30 MG PO CPEP: 30.0000 mg | ORAL_CAPSULE | Freq: Every day | ORAL | Status: DC

## 2014-06-17 MED ORDER — FOSAPREPITANT DIMEGLUMINE INJECTION 150 MG
150.0000 mg | Freq: Once | INTRAVENOUS | Status: AC
  Administered 2014-06-17: 150 mg via INTRAVENOUS
  Filled 2014-06-17: qty 5

## 2014-06-17 MED ORDER — PALONOSETRON HCL INJECTION 0.25 MG/5ML
0.2500 mg | Freq: Once | INTRAVENOUS | Status: AC
  Administered 2014-06-17: 0.25 mg via INTRAVENOUS

## 2014-06-17 MED ORDER — DEXAMETHASONE SODIUM PHOSPHATE 20 MG/5ML IJ SOLN
12.0000 mg | Freq: Once | INTRAMUSCULAR | Status: AC
  Administered 2014-06-17: 12 mg via INTRAVENOUS

## 2014-06-17 MED ORDER — POTASSIUM CHLORIDE 2 MEQ/ML IV SOLN
Freq: Once | INTRAVENOUS | Status: AC
  Administered 2014-06-17: 09:00:00 via INTRAVENOUS
  Filled 2014-06-17: qty 10

## 2014-06-17 MED ORDER — SODIUM CHLORIDE 0.9 % IV SOLN
Freq: Once | INTRAVENOUS | Status: AC
  Administered 2014-06-17: 09:00:00 via INTRAVENOUS

## 2014-06-17 MED ORDER — HEPARIN SOD (PORK) LOCK FLUSH 100 UNIT/ML IV SOLN
500.0000 [IU] | Freq: Once | INTRAVENOUS | Status: AC | PRN
  Administered 2014-06-17: 500 [IU]
  Filled 2014-06-17: qty 5

## 2014-06-17 MED ORDER — DEXAMETHASONE SODIUM PHOSPHATE 20 MG/5ML IJ SOLN
INTRAMUSCULAR | Status: AC
  Filled 2014-06-17: qty 5

## 2014-06-17 MED ORDER — SODIUM CHLORIDE 0.9 % IJ SOLN
10.0000 mL | INTRAMUSCULAR | Status: DC | PRN
  Administered 2014-06-17: 10 mL
  Filled 2014-06-17: qty 10

## 2014-06-17 MED ORDER — SODIUM CHLORIDE 0.9 % IJ SOLN
10.0000 mL | INTRAMUSCULAR | Status: DC | PRN
  Administered 2014-06-17: 10 mL via INTRAVENOUS
  Filled 2014-06-17: qty 10

## 2014-06-17 NOTE — Progress Notes (Signed)
[facility] [phone]   [phone]  OFFICE PROGRESS NOTE  Mathews Argyle, MD [address] [place] [address]  DIAGNOSIS: Stage IV (T1b, N2, M1b) non-small cell lung cancer, adenocarcinoma, presented with a right lower lobe lung mass as well as mediastinal, hilar, bilateral supraclavicular lymphadenopathy in addition to extensive liver metastases and retroperitoneal lymphadenopathy diagnosed in December of 2014.  MOLECULAR BIOMARKERS: [facility] ONE biomarker testing revealed TP53 E204*, KEAP1 R671f*25+, NFKBIA amplification, NKX2-1 amplification, SMARCA4 E463*. The patient was negative for EGFR and ALK gene translocation   PRIOR THERAPY: Systemic chemotherapy with carboplatin for AUC of 5 and Alimta 500 mg/M2 every 3 weeks, status post 3 cycles. First dose 09/02/2013.   CURRENT THERAPY: Systemic chemotherapy with cisplatin 60 mg/M2 on days 1 and gemcitabine 800 mg/M2 on days 1 and 8 every 3 weeks, first cycle on 11/12/2013. Status post 10 cycles.   CHEMOTHERAPY INTENT: palliative  CURRENT # OF CHEMOTHERAPY CYCLES: 11  CURRENT ANTIEMETICS: Zofran, dexamethasone and Compazine.  CURRENT SMOKING STATUS: former smoker  ORAL CHEMOTHERAPY AND CONSENT: None  CURRENT BISPHOSPHONATES USE: None  PAIN MANAGEMENT: 0/10  NARCOTICS INDUCED CONSTIPATION: None  LIVING WILL AND CODE STATUS: Full Code  INTERVAL HISTORY: JMASHAL SLAVICK89y.o. female returns to the clinic today for follow up visit accompanied by her husband. The patient is feeling fine today with no specific complaints except for fatigue and mild peripheral neuropathy which is now extending up in her leg. She continues to have some hearing deficit. The patient denied having any significant nausea or vomiting. She denied having any fever or chills. She has no significant weight loss or night sweats. She denied having any significant chest pain, shortness of breath,  cough or hemoptysis. She tolerated the last cycle of her systemic chemotherapy with reduced dose cisplatin and gemcitabine fairly well.   MEDICAL HISTORY: Past Medical History  Diagnosis Date  . HTN (hypertension)   . lung ca dx'd 07/2013    ALLERGIES:  is allergic to hctz and plendil.  MEDICATIONS:  Current Outpatient Prescriptions  Medication Sig Dispense Refill  . amLODipine (NORVASC) 10 MG tablet Take 10 mg by mouth every morning.       .Marland Kitchenaspirin EC 81 MG tablet Take 81 mg by mouth every morning.       . calcium carbonate (TUMS - DOSED IN MG ELEMENTAL CALCIUM) 500 MG chewable tablet Chew 1 tablet by mouth 3 (three) times daily as needed for indigestion or heartburn.       . lidocaine-prilocaine (EMLA) cream Apply 1 application topically as needed. Apply to port 1 hour before chemo appointment  30 g  0  . lisinopril (PRINIVIL,ZESTRIL) 20 MG tablet Take 20 mg by mouth every morning.       . magnesium oxide (MAG-OX) 400 (241.3 MG) MG tablet Take 1 tablet (400 mg total) by mouth 2 (two) times daily.  60 tablet  1  . nebivolol (BYSTOLIC) 10 MG tablet Take 10 mg by mouth every morning.       .Marland Kitchenomeprazole (PRILOSEC) 40 MG capsule Take 40 mg by mouth daily.      .Marland KitchenoxyCODONE (ROXICODONE) 5 MG immediate release tablet Take 1 tablet (5 mg total) by mouth every 6 (six) hours as needed for severe pain.  60 tablet  0  . prochlorperazine (COMPAZINE) 10 MG tablet Take 1 tablet (10 mg total) by mouth every 6 (six) hours as needed for nausea or  vomiting.  60 tablet  1   No current facility-administered medications for this visit.    REVIEW OF SYSTEMS:  A comprehensive review of systems was negative except for: Neurological: positive for paresthesia   PHYSICAL EXAMINATION: General appearance: alert, cooperative and no distress Head: Normocephalic, without obvious abnormality, atraumatic Neck: no adenopathy, no JVD, supple, symmetrical, trachea midline and thyroid not enlarged, symmetric, no  tenderness/mass/nodules Lymph nodes: Cervical, supraclavicular, and axillary nodes normal. Resp: clear to auscultation bilaterally Back: symmetric, no curvature. ROM normal. No CVA tenderness. Cardio: regular rate and rhythm, S1, S2 normal, no murmur, click, rub or gallop GI: soft, non-tender; bowel sounds normal; no masses,  no organomegaly Extremities: extremities normal, atraumatic, no cyanosis or edema Neurologic: Alert and oriented X 3, normal strength and tone. Normal symmetric reflexes. Normal coordination and gait  ECOG PERFORMANCE STATUS: 1 - Symptomatic but completely ambulatory  Blood pressure 131/56, pulse 75, temperature 98.3 F (36.8 C), temperature source Oral, resp. rate 18, height _0  (1.575 m), weight 112 lb 14.4 oz (51.211 kg), SpO2 99.00%.  LABORATORY DATA: Lab Results  Component Value Date   WBC 3.3* 06/17/2014   HGB 9.1* 06/17/2014   HCT 27.0* 06/17/2014   MCV 94.4 06/17/2014   PLT 123* 06/17/2014      Chemistry      Component Value Date/Time   NA 134* 06/03/2014 0858   NA 135 05/13/2014 1007   K 4.5 06/03/2014 0858   K 4.5 05/13/2014 1007   CL 96 05/13/2014 1007   CO2 28 06/03/2014 0858   CO2 29 05/13/2014 1007   BUN 13.1 06/03/2014 0858   BUN 18 05/13/2014 1007   CREATININE 0.9 06/03/2014 0858   CREATININE 1.00 05/13/2014 1007      Component Value Date/Time   CALCIUM 9.7 06/03/2014 0858   CALCIUM 9.5 05/13/2014 1007       RADIOGRAPHIC STUDIES:  ASSESSMENT AND PLAN: This is a very pleasant 63 years old white female recently diagnosed with metastatic non-small cell lung cancer, adenocarcinoma presented with right lower lobe lung mass in addition to mediastinal and supraclavicular lymphadenopathy as well as diffuse metastatic liver lesions and retroperitoneal lymphadenopathy. She underwent 3 cycles of systemic chemotherapy with carboplatin and Alimta but unfortunately has evidence for disease progression especially in the liver and new small left adrenal  metastasis. She is currently undergoing second line chemotherapy with cisplatin and gemcitabine status post 10 cycles and tolerating it fairly well patient after reducing the dose of cisplatin. I recommended for her to continue the same treatment with reduced dose cisplatin and gemcitabine. She will start cycle #11 today. She would come back for followup visit in 3 weeks with the next cycle of her treatment. For the peripheral neuropathy, I would start the patient on Cymbalta 30 mg by mouth each bedtime. She was advised to call immediately if she has any concerning symptoms in the interval. The patient voices understanding of current disease status and treatment options and is in agreement with the current care plan.  All questions were answered. The patient knows to call the clinic with any problems, questions or concerns. We can certainly see the patient much sooner if necessary.  Disclaimer: This note was dictated with voice recognition software. Similar sounding words can inadvertently be transcribed and may not be corrected upon review.

## 2014-06-17 NOTE — Telephone Encounter (Signed)
Per patient and MD request I have fixed 11/12 appt.

## 2014-06-17 NOTE — Patient Instructions (Signed)

## 2014-06-17 NOTE — Patient Instructions (Signed)
[facility] Discharge Instructions for Patients Receiving Chemotherapy  Today you received the following chemotherapy agents Cisplatin and Gemzar.  To help prevent nausea and vomiting after your treatment, we encourage you to take your nausea medication Compazine 10 mg every 6 hours as needed.   If you develop nausea and vomiting that is not controlled by your nausea medication, call the clinic.   BELOW ARE SYMPTOMS THAT SHOULD BE REPORTED IMMEDIATELY:  *FEVER GREATER THAN 100.5 F  *CHILLS WITH OR WITHOUT FEVER  NAUSEA AND VOMITING THAT IS NOT CONTROLLED WITH YOUR NAUSEA MEDICATION  *UNUSUAL SHORTNESS OF BREATH  *UNUSUAL BRUISING OR BLEEDING  TENDERNESS IN MOUTH AND THROAT WITH OR WITHOUT PRESENCE OF ULCERS  *URINARY PROBLEMS  *BOWEL PROBLEMS  UNUSUAL RASH Items with * indicate a potential emergency and should be followed up as soon as possible.  Feel free to call the clinic you have any questions or concerns. The clinic phone number is [phone].

## 2014-06-17 NOTE — Telephone Encounter (Signed)
, °

## 2014-06-17 NOTE — Patient Instructions (Signed)
Smoking Cessation, Tips for Success  If you are ready to quit smoking, congratulations! You have chosen to help yourself be healthier. Cigarettes bring nicotine, tar, carbon monoxide, and other irritants into your body. Your lungs, heart, and blood vessels will be able to work better without these poisons. There are many different ways to quit smoking. Nicotine gum, nicotine patches, a nicotine inhaler, or nicotine nasal spray can help with physical craving. Hypnosis, support groups, and medicines help break the habit of smoking.  WHAT THINGS CAN I DO TO MAKE QUITTING EASIER?   Here are some tips to help you quit for good:  · Pick a date when you will quit smoking completely. Tell all of your friends and family about your plan to quit on that date.  · Do not try to slowly cut down on the number of cigarettes you are smoking. Pick a quit date and quit smoking completely starting on that day.  · Throw away all cigarettes.    · Clean and remove all ashtrays from your home, work, and car.  · On a card, write down your reasons for quitting. Carry the card with you and read it when you get the urge to smoke.  · Cleanse your body of nicotine. Drink enough water and fluids to keep your urine clear or pale yellow. Do this after quitting to flush the nicotine from your body.  · Learn to predict your moods. Do not let a bad situation be your excuse to have a cigarette. Some situations in your life might tempt you into wanting a cigarette.  · Never have "just one" cigarette. It leads to wanting another and another. Remind yourself of your decision to quit.  · Change habits associated with smoking. If you smoked while driving or when feeling stressed, try other activities to replace smoking. Stand up when drinking your coffee. Brush your teeth after eating. Sit in a different chair when you read the paper. Avoid alcohol while trying to quit, and try to drink fewer caffeinated beverages. Alcohol and caffeine may urge you to  smoke.  · Avoid foods and drinks that can trigger a desire to smoke, such as sugary or spicy foods and alcohol.  · Ask people who smoke not to smoke around you.  · Have something planned to do right after eating or having a cup of coffee. For example, plan to take a walk or exercise.  · Try a relaxation exercise to calm you down and decrease your stress. Remember, you may be tense and nervous for the first 2 weeks after you quit, but this will pass.  · Find new activities to keep your hands busy. Play with a pen, coin, or rubber band. Doodle or draw things on paper.  · Brush your teeth right after eating. This will help cut down on the craving for the taste of tobacco after meals. You can also try mouthwash.    · Use oral substitutes in place of cigarettes. Try using lemon drops, carrots, cinnamon sticks, or chewing gum. Keep them handy so they are available when you have the urge to smoke.  · When you have the urge to smoke, try deep breathing.  · Designate your home as a nonsmoking area.  · If you are a heavy smoker, ask your health care provider about a prescription for nicotine chewing gum. It can ease your withdrawal from nicotine.  · Reward yourself. Set aside the cigarette money you save and buy yourself something nice.  · Look for   support from others. Join a support group or smoking cessation program. Ask someone at home or at work to help you with your plan to quit smoking.  · Always ask yourself, "Do I need this cigarette or is this just a reflex?" Tell yourself, "Today, I choose not to smoke," or "I do not want to smoke." You are reminding yourself of your decision to quit.  · Do not replace cigarette smoking with electronic cigarettes (commonly called e-cigarettes). The safety of e-cigarettes is unknown, and some may contain harmful chemicals.  · If you relapse, do not give up! Plan ahead and think about what you will do the next time you get the urge to smoke.  HOW WILL I FEEL WHEN I QUIT SMOKING?  You  may have symptoms of withdrawal because your body is used to nicotine (the addictive substance in cigarettes). You may crave cigarettes, be irritable, feel very hungry, cough often, get headaches, or have difficulty concentrating. The withdrawal symptoms are only temporary. They are strongest when you first quit but will go away within 10-14 days. When withdrawal symptoms occur, stay in control. Think about your reasons for quitting. Remind yourself that these are signs that your body is healing and getting used to being without cigarettes. Remember that withdrawal symptoms are easier to treat than the major diseases that smoking can cause.   Even after the withdrawal is over, expect periodic urges to smoke. However, these cravings are generally short lived and will go away whether you smoke or not. Do not smoke!  WHAT RESOURCES ARE AVAILABLE TO HELP ME QUIT SMOKING?  Your health care provider can direct you to community resources or hospitals for support, which may include:  · Group support.  · Education.  · Hypnosis.  · Therapy.  Document Released: 05/11/2004 Document Revised: 12/28/2013 Document Reviewed: 01/29/2013  [employer]® Patient Information ©2015 [employer]. This information is not intended to replace advice given to you by your health care provider. Make sure you discuss any questions you have with your health care provider.

## 2014-06-24 ENCOUNTER — Ambulatory Visit (HOSPITAL_BASED_OUTPATIENT_CLINIC_OR_DEPARTMENT_OTHER): Payer: 59

## 2014-06-24 ENCOUNTER — Ambulatory Visit: Payer: 59

## 2014-06-24 ENCOUNTER — Other Ambulatory Visit (HOSPITAL_BASED_OUTPATIENT_CLINIC_OR_DEPARTMENT_OTHER): Payer: 59

## 2014-06-24 DIAGNOSIS — C221 Intrahepatic bile duct carcinoma: Secondary | ICD-10-CM

## 2014-06-24 DIAGNOSIS — C3431 Malignant neoplasm of lower lobe, right bronchus or lung: Secondary | ICD-10-CM

## 2014-06-24 DIAGNOSIS — C787 Secondary malignant neoplasm of liver and intrahepatic bile duct: Secondary | ICD-10-CM

## 2014-06-24 DIAGNOSIS — C7970 Secondary malignant neoplasm of unspecified adrenal gland: Secondary | ICD-10-CM

## 2014-06-24 DIAGNOSIS — Z5111 Encounter for antineoplastic chemotherapy: Secondary | ICD-10-CM

## 2014-06-24 DIAGNOSIS — C3491 Malignant neoplasm of unspecified part of right bronchus or lung: Secondary | ICD-10-CM

## 2014-06-24 DIAGNOSIS — Z95828 Presence of other vascular implants and grafts: Secondary | ICD-10-CM

## 2014-06-24 DIAGNOSIS — R112 Nausea with vomiting, unspecified: Secondary | ICD-10-CM

## 2014-06-24 LAB — COMPREHENSIVE METABOLIC PANEL (CC13)
ALK PHOS: 100 U/L (ref 40–150)
ALT: 39 U/L (ref 0–55)
AST: 25 U/L (ref 5–34)
Albumin: 3.6 g/dL (ref 3.5–5.0)
Anion Gap: 8 mEq/L (ref 3–11)
BILIRUBIN TOTAL: 0.26 mg/dL (ref 0.20–1.20)
BUN: 20.8 mg/dL (ref 7.0–26.0)
CO2: 30 mEq/L — ABNORMAL HIGH (ref 22–29)
CREATININE: 1.1 mg/dL (ref 0.6–1.1)
Calcium: 10 mg/dL (ref 8.4–10.4)
Chloride: 96 mEq/L — ABNORMAL LOW (ref 98–109)
Glucose: 114 mg/dl (ref 70–140)
Potassium: 4.1 mEq/L (ref 3.5–5.1)
Sodium: 135 mEq/L — ABNORMAL LOW (ref 136–145)
Total Protein: 6.6 g/dL (ref 6.4–8.3)

## 2014-06-24 LAB — CBC WITH DIFFERENTIAL/PLATELET
BASO%: 0.3 % (ref 0.0–2.0)
BASOS ABS: 0 10*3/uL (ref 0.0–0.1)
EOS%: 0 % (ref 0.0–7.0)
Eosinophils Absolute: 0 10*3/uL (ref 0.0–0.5)
HEMATOCRIT: 26.1 % — AB (ref 34.8–46.6)
HEMOGLOBIN: 9.2 g/dL — AB (ref 11.6–15.9)
LYMPH%: 51.2 % — AB (ref 14.0–49.7)
MCH: 32.2 pg (ref 25.1–34.0)
MCHC: 35.2 g/dL (ref 31.5–36.0)
MCV: 91.3 fL (ref 79.5–101.0)
MONO#: 0.2 10*3/uL (ref 0.1–0.9)
MONO%: 5.9 % (ref 0.0–14.0)
NEUT#: 1.4 10*3/uL — ABNORMAL LOW (ref 1.5–6.5)
NEUT%: 42.6 % (ref 38.4–76.8)
PLATELETS: 155 10*3/uL (ref 145–400)
RBC: 2.86 10*6/uL — ABNORMAL LOW (ref 3.70–5.45)
RDW: 16.3 % — ABNORMAL HIGH (ref 11.2–14.5)
WBC: 3.2 10*3/uL — ABNORMAL LOW (ref 3.9–10.3)
lymph#: 1.7 10*3/uL (ref 0.9–3.3)

## 2014-06-24 LAB — MAGNESIUM (CC13): Magnesium: 2.2 mg/dl (ref 1.5–2.5)

## 2014-06-24 MED ORDER — SODIUM CHLORIDE 0.9 % IV SOLN
Freq: Once | INTRAVENOUS | Status: AC
  Administered 2014-06-24: 14:00:00 via INTRAVENOUS

## 2014-06-24 MED ORDER — PROCHLORPERAZINE MALEATE 10 MG PO TABS
10.0000 mg | ORAL_TABLET | Freq: Once | ORAL | Status: AC
  Administered 2014-06-24: 10 mg via ORAL

## 2014-06-24 MED ORDER — SODIUM CHLORIDE 0.9 % IJ SOLN
10.0000 mL | INTRAMUSCULAR | Status: DC | PRN
  Administered 2014-06-24: 10 mL via INTRAVENOUS
  Filled 2014-06-24: qty 10

## 2014-06-24 MED ORDER — GEMCITABINE HCL CHEMO INJECTION 1 GM/26.3ML
800.0000 mg/m2 | Freq: Once | INTRAVENOUS | Status: AC
  Administered 2014-06-24: 1178 mg via INTRAVENOUS
  Filled 2014-06-24: qty 30.98

## 2014-06-24 MED ORDER — PROCHLORPERAZINE MALEATE 10 MG PO TABS
ORAL_TABLET | ORAL | Status: AC
  Filled 2014-06-24: qty 1

## 2014-06-24 MED ORDER — HEPARIN SOD (PORK) LOCK FLUSH 100 UNIT/ML IV SOLN
500.0000 [IU] | Freq: Once | INTRAVENOUS | Status: AC | PRN
  Administered 2014-06-24: 500 [IU]
  Filled 2014-06-24: qty 5

## 2014-06-24 MED ORDER — SODIUM CHLORIDE 0.9 % IJ SOLN
10.0000 mL | INTRAMUSCULAR | Status: DC | PRN
  Administered 2014-06-24: 10 mL
  Filled 2014-06-24: qty 10

## 2014-06-24 NOTE — Patient Instructions (Signed)

## 2014-06-24 NOTE — Patient Instructions (Signed)
[facility] Discharge Instructions for Patients Receiving Chemotherapy  Today you received the following chemotherapy agents Gemcitabine.   To help prevent nausea and vomiting after your treatment, we encourage you to take your nausea medication as directed.    If you develop nausea and vomiting that is not controlled by your nausea medication, call the clinic.   BELOW ARE SYMPTOMS THAT SHOULD BE REPORTED IMMEDIATELY:  *FEVER GREATER THAN 100.5 F  *CHILLS WITH OR WITHOUT FEVER  NAUSEA AND VOMITING THAT IS NOT CONTROLLED WITH YOUR NAUSEA MEDICATION  *UNUSUAL SHORTNESS OF BREATH  *UNUSUAL BRUISING OR BLEEDING  TENDERNESS IN MOUTH AND THROAT WITH OR WITHOUT PRESENCE OF ULCERS  *URINARY PROBLEMS  *BOWEL PROBLEMS  UNUSUAL RASH Items with * indicate a potential emergency and should be followed up as soon as possible.  Feel free to call the clinic you have any questions or concerns. The clinic phone number is [phone].

## 2014-06-24 NOTE — Progress Notes (Signed)
Ok to treat with ANC 1.4 per MD Mohamed 

## 2014-07-01 ENCOUNTER — Other Ambulatory Visit: Payer: 59

## 2014-07-08 ENCOUNTER — Telehealth: Payer: Self-pay | Admitting: Internal Medicine

## 2014-07-08 ENCOUNTER — Ambulatory Visit (HOSPITAL_BASED_OUTPATIENT_CLINIC_OR_DEPARTMENT_OTHER): Payer: 59 | Admitting: Internal Medicine

## 2014-07-08 ENCOUNTER — Ambulatory Visit (HOSPITAL_BASED_OUTPATIENT_CLINIC_OR_DEPARTMENT_OTHER): Payer: 59 | Admitting: Nurse Practitioner

## 2014-07-08 ENCOUNTER — Other Ambulatory Visit (HOSPITAL_BASED_OUTPATIENT_CLINIC_OR_DEPARTMENT_OTHER): Payer: 59

## 2014-07-08 ENCOUNTER — Ambulatory Visit (HOSPITAL_BASED_OUTPATIENT_CLINIC_OR_DEPARTMENT_OTHER): Payer: 59

## 2014-07-08 ENCOUNTER — Telehealth: Payer: Self-pay | Admitting: *Deleted

## 2014-07-08 ENCOUNTER — Ambulatory Visit: Payer: 59

## 2014-07-08 ENCOUNTER — Ambulatory Visit (HOSPITAL_COMMUNITY)
Admission: RE | Admit: 2014-07-08 | Discharge: 2014-07-08 | Disposition: A | Discharge status: Home / Self Care | Payer: 59 | Source: Ambulatory Visit | Attending: Internal Medicine | Admitting: Internal Medicine

## 2014-07-08 ENCOUNTER — Other Ambulatory Visit: Payer: Self-pay | Admitting: *Deleted

## 2014-07-08 ENCOUNTER — Other Ambulatory Visit: Payer: Self-pay | Admitting: Internal Medicine

## 2014-07-08 ENCOUNTER — Ambulatory Visit: Payer: 59 | Admitting: Physician Assistant

## 2014-07-08 DIAGNOSIS — D6481 Anemia due to antineoplastic chemotherapy: Secondary | ICD-10-CM

## 2014-07-08 DIAGNOSIS — I10 Essential (primary) hypertension: Secondary | ICD-10-CM | POA: Insufficient documentation

## 2014-07-08 DIAGNOSIS — T451X5A Adverse effect of antineoplastic and immunosuppressive drugs, initial encounter: Secondary | ICD-10-CM

## 2014-07-08 DIAGNOSIS — C3491 Malignant neoplasm of unspecified part of right bronchus or lung: Secondary | ICD-10-CM | POA: Diagnosis not present

## 2014-07-08 DIAGNOSIS — C787 Secondary malignant neoplasm of liver and intrahepatic bile duct: Secondary | ICD-10-CM

## 2014-07-08 DIAGNOSIS — Z7982 Long term (current) use of aspirin: Secondary | ICD-10-CM | POA: Diagnosis not present

## 2014-07-08 DIAGNOSIS — G609 Hereditary and idiopathic neuropathy, unspecified: Secondary | ICD-10-CM

## 2014-07-08 DIAGNOSIS — C3431 Malignant neoplasm of lower lobe, right bronchus or lung: Secondary | ICD-10-CM

## 2014-07-08 DIAGNOSIS — T7840XA Allergy, unspecified, initial encounter: Secondary | ICD-10-CM

## 2014-07-08 DIAGNOSIS — R112 Nausea with vomiting, unspecified: Secondary | ICD-10-CM

## 2014-07-08 DIAGNOSIS — C3490 Malignant neoplasm of unspecified part of unspecified bronchus or lung: Secondary | ICD-10-CM

## 2014-07-08 DIAGNOSIS — R5383 Other fatigue: Secondary | ICD-10-CM

## 2014-07-08 DIAGNOSIS — Z95828 Presence of other vascular implants and grafts: Secondary | ICD-10-CM

## 2014-07-08 DIAGNOSIS — C221 Intrahepatic bile duct carcinoma: Secondary | ICD-10-CM

## 2014-07-08 DIAGNOSIS — Z5111 Encounter for antineoplastic chemotherapy: Secondary | ICD-10-CM

## 2014-07-08 LAB — CBC WITH DIFFERENTIAL/PLATELET
BASO%: 0.3 % (ref 0.0–2.0)
BASOS ABS: 0 10*3/uL (ref 0.0–0.1)
EOS%: 0.3 % (ref 0.0–7.0)
Eosinophils Absolute: 0 10*3/uL (ref 0.0–0.5)
HCT: 22.8 % — ABNORMAL LOW (ref 34.8–46.6)
HGB: 7.6 g/dL — ABNORMAL LOW (ref 11.6–15.9)
LYMPH%: 31.2 % (ref 14.0–49.7)
MCH: 31.4 pg (ref 25.1–34.0)
MCHC: 33.3 g/dL (ref 31.5–36.0)
MCV: 94.2 fL (ref 79.5–101.0)
MONO#: 0.6 10*3/uL (ref 0.1–0.9)
MONO%: 17.8 % — AB (ref 0.0–14.0)
NEUT%: 50.4 % (ref 38.4–76.8)
NEUTROS ABS: 1.8 10*3/uL (ref 1.5–6.5)
PLATELETS: 105 10*3/uL — AB (ref 145–400)
RBC: 2.42 10*6/uL — ABNORMAL LOW (ref 3.70–5.45)
RDW: 19.2 % — ABNORMAL HIGH (ref 11.2–14.5)
WBC: 3.5 10*3/uL — ABNORMAL LOW (ref 3.9–10.3)
lymph#: 1.1 10*3/uL (ref 0.9–3.3)

## 2014-07-08 LAB — COMPREHENSIVE METABOLIC PANEL (CC13)
ALBUMIN: 3.5 g/dL (ref 3.5–5.0)
ALK PHOS: 94 U/L (ref 40–150)
ALT: 11 U/L (ref 0–55)
AST: 18 U/L (ref 5–34)
Anion Gap: 8 mEq/L (ref 3–11)
BILIRUBIN TOTAL: 0.26 mg/dL (ref 0.20–1.20)
BUN: 14.1 mg/dL (ref 7.0–26.0)
CO2: 25 mEq/L (ref 22–29)
CREATININE: 1 mg/dL (ref 0.6–1.1)
Calcium: 9.3 mg/dL (ref 8.4–10.4)
Chloride: 101 mEq/L (ref 98–109)
GLUCOSE: 101 mg/dL (ref 70–140)
Potassium: 4.2 mEq/L (ref 3.5–5.1)
Sodium: 133 mEq/L — ABNORMAL LOW (ref 136–145)
Total Protein: 6.3 g/dL — ABNORMAL LOW (ref 6.4–8.3)

## 2014-07-08 LAB — MAGNESIUM (CC13): MAGNESIUM: 2.1 mg/dL (ref 1.5–2.5)

## 2014-07-08 LAB — HOLD TUBE, BLOOD BANK

## 2014-07-08 LAB — PREPARE RBC (CROSSMATCH)

## 2014-07-08 MED ORDER — HEPARIN SOD (PORK) LOCK FLUSH 100 UNIT/ML IV SOLN
500.0000 [IU] | Freq: Once | INTRAVENOUS | Status: DC | PRN
  Filled 2014-07-08: qty 5

## 2014-07-08 MED ORDER — DEXAMETHASONE SODIUM PHOSPHATE 20 MG/5ML IJ SOLN
INTRAMUSCULAR | Status: AC
  Filled 2014-07-08: qty 5

## 2014-07-08 MED ORDER — METHYLPREDNISOLONE SODIUM SUCC 125 MG IJ SOLR
125.0000 mg | Freq: Once | INTRAMUSCULAR | Status: AC
  Administered 2014-07-08: 125 mg via INTRAVENOUS

## 2014-07-08 MED ORDER — DEXAMETHASONE SODIUM PHOSPHATE 20 MG/5ML IJ SOLN
12.0000 mg | Freq: Once | INTRAMUSCULAR | Status: AC
  Administered 2014-07-08: 12 mg via INTRAVENOUS

## 2014-07-08 MED ORDER — SODIUM CHLORIDE 0.9 % IJ SOLN
10.0000 mL | INTRAMUSCULAR | Status: DC | PRN
  Filled 2014-07-08: qty 10

## 2014-07-08 MED ORDER — DIPHENHYDRAMINE HCL 50 MG/ML IJ SOLN
25.0000 mg | Freq: Once | INTRAMUSCULAR | Status: AC
  Administered 2014-07-08: 25 mg via INTRAVENOUS

## 2014-07-08 MED ORDER — SODIUM CHLORIDE 0.9 % IV SOLN
Freq: Once | INTRAVENOUS | Status: AC
  Administered 2014-07-08: 10:00:00 via INTRAVENOUS

## 2014-07-08 MED ORDER — GEMCITABINE HCL CHEMO INJECTION 1 GM/26.3ML
800.0000 mg/m2 | Freq: Once | INTRAVENOUS | Status: AC
  Administered 2014-07-08: 1178 mg via INTRAVENOUS
  Filled 2014-07-08: qty 30.98

## 2014-07-08 MED ORDER — SODIUM CHLORIDE 0.9 % IV SOLN
150.0000 mg | Freq: Once | INTRAVENOUS | Status: AC
  Administered 2014-07-08: 150 mg via INTRAVENOUS
  Filled 2014-07-08: qty 5

## 2014-07-08 MED ORDER — POTASSIUM CHLORIDE 2 MEQ/ML IV SOLN
Freq: Once | INTRAVENOUS | Status: AC
  Administered 2014-07-08: 10:00:00 via INTRAVENOUS
  Filled 2014-07-08: qty 10

## 2014-07-08 MED ORDER — SODIUM CHLORIDE 0.9 % IV SOLN
Freq: Once | INTRAVENOUS | Status: AC
  Administered 2014-07-08: 13:00:00 via INTRAVENOUS

## 2014-07-08 MED ORDER — SODIUM CHLORIDE 0.9 % IV SOLN
60.0000 mg/m2 | Freq: Once | INTRAVENOUS | Status: AC
  Administered 2014-07-08: 88 mg via INTRAVENOUS
  Filled 2014-07-08: qty 88

## 2014-07-08 MED ORDER — FAMOTIDINE IN NACL 20-0.9 MG/50ML-% IV SOLN
20.0000 mg | Freq: Once | INTRAVENOUS | Status: AC
  Administered 2014-07-08: 20 mg via INTRAVENOUS

## 2014-07-08 MED ORDER — SODIUM CHLORIDE 0.9 % IJ SOLN
10.0000 mL | INTRAMUSCULAR | Status: DC | PRN
  Administered 2014-07-08: 10 mL via INTRAVENOUS
  Filled 2014-07-08: qty 10

## 2014-07-08 MED ORDER — PALONOSETRON HCL INJECTION 0.25 MG/5ML
0.2500 mg | Freq: Once | INTRAVENOUS | Status: AC
  Administered 2014-07-08: 0.25 mg via INTRAVENOUS

## 2014-07-08 MED ORDER — PALONOSETRON HCL INJECTION 0.25 MG/5ML
INTRAVENOUS | Status: AC
  Filled 2014-07-08: qty 5

## 2014-07-08 NOTE — Progress Notes (Signed)
Hgb 7.6 today, patient reports feeling "great" with no increased fatigue or SOB. Patient does report peripheral neuropathy with increased difficulty picking up/holding items. Dr. Julien Nordmann notified of the above. Patient to receive RBC transfusion this week and it is OK to treat with chemotherapy today. Patient informed, verbalizing understanding of the plan.

## 2014-07-08 NOTE — Progress Notes (Signed)
1255: Pt reporting feeling hot all over and having a scratchy throat and some shortness of breath. Cisplatin and hydration fluids stopped and a new line of normal saline hung. Vital signs obtained and patient found to be hypertensive and oxygen saturation in the upper 80s.  5L/[place] of 02 initiated. 1257: Selena Lesser, NP at bedside to assess patient. Emergency medicines given per verbal orders from Selena Lesser, NP.  13:05: Dr. Julien Nordmann at bedside to assess patient. Orders given to d/c cisplatin and hydration fluids; only give Normal Saline.  Pt reports feeling somewhat better. Vital signs stable. Will continue to monitor patient closely.  Husband at chairside. 1330: Pt states she is feeling a little better but now c/o having some "jittery" feelings.  Selena Lesser, NP at bedside and states it is most likely due to the steroids.  1350: Pt feeling much better, states she is not quite back to normal. Will continue to monitor patient. Vital signs remain stable, pt on room air.  1430: Selena Lesser, NP at bedside to assess patient. Okay to discharge patient home. Pt discharged in no acute distress, vital signs stable, accompanied by husband.

## 2014-07-08 NOTE — Patient Instructions (Signed)

## 2014-07-08 NOTE — Telephone Encounter (Signed)
Pt confirmed labs/ov per 11/12 POF, gave pt AVS..... KJ sent msg add chemo

## 2014-07-08 NOTE — Patient Instructions (Signed)
[facility] Discharge Instructions for Patients Receiving Chemotherapy  Today you received the following chemotherapy agents: Gemzar and Cisplatin.  To help prevent nausea and vomiting after your treatment, we encourage you to take your nausea medication as prescribed.   If you develop nausea and vomiting that is not controlled by your nausea medication, call the clinic.   BELOW ARE SYMPTOMS THAT SHOULD BE REPORTED IMMEDIATELY:  *FEVER GREATER THAN 100.5 F  *CHILLS WITH OR WITHOUT FEVER  NAUSEA AND VOMITING THAT IS NOT CONTROLLED WITH YOUR NAUSEA MEDICATION  *UNUSUAL SHORTNESS OF BREATH  *UNUSUAL BRUISING OR BLEEDING  TENDERNESS IN MOUTH AND THROAT WITH OR WITHOUT PRESENCE OF ULCERS  *URINARY PROBLEMS  *BOWEL PROBLEMS  UNUSUAL RASH Items with * indicate a potential emergency and should be followed up as soon as possible.  Feel free to call the clinic you have any questions or concerns. The clinic phone number is [phone].

## 2014-07-08 NOTE — Progress Notes (Signed)
[facility] [phone]   [phone]  OFFICE PROGRESS NOTE  Mathews Argyle, MD [address] [place] [address]  DIAGNOSIS: Stage IV (T1b, N2, M1b) non-small cell lung cancer, adenocarcinoma, presented with a right lower lobe lung mass as well as mediastinal, hilar, bilateral supraclavicular lymphadenopathy in addition to extensive liver metastases and retroperitoneal lymphadenopathy diagnosed in December of 2014.  MOLECULAR BIOMARKERS: [facility] ONE biomarker testing revealed TP53 E204*, KEAP1 R641f*25+, NFKBIA amplification, NKX2-1 amplification, SMARCA4 E463*. The patient was negative for EGFR and ALK gene translocation   PRIOR THERAPY: Systemic chemotherapy with carboplatin for AUC of 5 and Alimta 500 mg/M2 every 3 weeks, status post 3 cycles. First dose 09/02/2013.   CURRENT THERAPY: Systemic chemotherapy with cisplatin 60 mg/M2 on days 1 and gemcitabine 800 mg/M2 on days 1 and 8 every 3 weeks, first cycle on 11/12/2013. Status post 11 cycles.   CHEMOTHERAPY INTENT: palliative  CURRENT # OF CHEMOTHERAPY CYCLES: 12  CURRENT ANTIEMETICS: Zofran, dexamethasone and Compazine.  CURRENT SMOKING STATUS: former smoker  ORAL CHEMOTHERAPY AND CONSENT: None  CURRENT BISPHOSPHONATES USE: None  PAIN MANAGEMENT: 0/10  NARCOTICS INDUCED CONSTIPATION: None  LIVING WILL AND CODE STATUS: Full Code  INTERVAL HISTORY: Amber HENDERSHOTT89y.o. female returns to the clinic today for follow up visit accompanied by her husband. The patient is feeling fine today with no specific complaints except for fatigue and and the persistent mild peripheral neuropathy. She was started on treatment with Cymbalta but unfortunately the patient was unable to tolerate it and she was sleeping a lot. She was taking her dose in the morning. The patient denied having any significant nausea or vomiting. She denied having any fever or chills. She has no  significant weight loss or night sweats. She denied having any significant chest pain, shortness of breath, cough or hemoptysis. She tolerated the last cycle of her systemic chemotherapy with reduced dose cisplatin and gemcitabine fairly well.    MEDICAL HISTORY: Past Medical History  Diagnosis Date  . HTN (hypertension)   . lung ca dx'd 07/2013    ALLERGIES:  is allergic to hctz and plendil.  MEDICATIONS:  Current Outpatient Prescriptions  Medication Sig Dispense Refill  . amLODipine (NORVASC) 10 MG tablet Take 10 mg by mouth every morning.     .Marland Kitchenaspirin EC 81 MG tablet Take 81 mg by mouth every morning.     . calcium carbonate (TUMS - DOSED IN MG ELEMENTAL CALCIUM) 500 MG chewable tablet Chew 1 tablet by mouth 3 (three) times daily as needed for indigestion or heartburn.     . DULoxetine (CYMBALTA) 30 MG capsule Take 1 capsule (30 mg total) by mouth daily. 30 capsule 1  . lidocaine-prilocaine (EMLA) cream Apply 1 application topically as needed. Apply to port 1 hour before chemo appointment 30 g 0  . lisinopril (PRINIVIL,ZESTRIL) 20 MG tablet Take 20 mg by mouth every morning.     . magnesium oxide (MAG-OX) 400 (241.3 MG) MG tablet Take 1 tablet (400 mg total) by mouth 2 (two) times daily. 60 tablet 1  . nebivolol (BYSTOLIC) 10 MG tablet Take 10 mg by mouth every morning.     .Marland Kitchenomeprazole (PRILOSEC) 40 MG capsule Take 40 mg by mouth daily.    .Marland KitchenoxyCODONE (ROXICODONE) 5 MG immediate release tablet Take 1 tablet (5 mg total) by mouth every 6 (six) hours as needed for severe pain. 60 tablet 0  . prochlorperazine (COMPAZINE)  10 MG tablet Take 1 tablet (10 mg total) by mouth every 6 (six) hours as needed for nausea or vomiting. 60 tablet 1   No current facility-administered medications for this visit.   Facility-Administered Medications Ordered in Other Visits  Medication Dose Route Frequency Provider Last Rate Last Dose  . CISplatin (PLATINOL) 88 mg in sodium chloride 0.9 % 250 mL chemo  infusion  60 mg/m2 (Treatment Plan Actual) Intravenous Once Curt Bears, MD      . fosaprepitant (EMEND) 150 mg in sodium chloride 0.9 % 145 mL IVPB  150 mg Intravenous Once Curt Bears, MD 300 mL/hr at 07/08/14 1121 150 mg at 07/08/14 1121  . Gemcitabine HCl (GEMZAR) 1,178 mg in sodium chloride 0.9 % 100 mL chemo infusion  800 mg/m2 (Treatment Plan Actual) Intravenous Once Curt Bears, MD      . heparin lock flush 100 unit/mL  500 Units Intracatheter Once PRN Curt Bears, MD      . sodium chloride 0.9 % injection 10 mL  10 mL Intracatheter PRN Curt Bears, MD        REVIEW OF SYSTEMS:  A comprehensive review of systems was negative except for: Neurological: positive for paresthesia   PHYSICAL EXAMINATION: General appearance: alert, cooperative and no distress Head: Normocephalic, without obvious abnormality, atraumatic Neck: no adenopathy, no JVD, supple, symmetrical, trachea midline and thyroid not enlarged, symmetric, no tenderness/mass/nodules Lymph nodes: Cervical, supraclavicular, and axillary nodes normal. Resp: clear to auscultation bilaterally Back: symmetric, no curvature. ROM normal. No CVA tenderness. Cardio: regular rate and rhythm, S1, S2 normal, no murmur, click, rub or gallop GI: soft, non-tender; bowel sounds normal; no masses,  no organomegaly Extremities: extremities normal, atraumatic, no cyanosis or edema Neurologic: Alert and oriented X 3, normal strength and tone. Normal symmetric reflexes. Normal coordination and gait  ECOG PERFORMANCE STATUS: 1 - Symptomatic but completely ambulatory  There were no vitals taken for this visit.  LABORATORY DATA: Lab Results  Component Value Date   WBC 3.5* 07/08/2014   HGB 7.6* 07/08/2014   HCT 22.8* 07/08/2014   MCV 94.2 07/08/2014   PLT 105* 07/08/2014      Chemistry      Component Value Date/Time   NA 133* 07/08/2014 0852   NA 135 05/13/2014 1007   K 4.2 07/08/2014 0852   K 4.5 05/13/2014 1007    CL 96 05/13/2014 1007   CO2 25 07/08/2014 0852   CO2 29 05/13/2014 1007   BUN 14.1 07/08/2014 0852   BUN 18 05/13/2014 1007   CREATININE 1.0 07/08/2014 0852   CREATININE 1.00 05/13/2014 1007      Component Value Date/Time   CALCIUM 9.3 07/08/2014 0852   CALCIUM 9.5 05/13/2014 1007       RADIOGRAPHIC STUDIES:  ASSESSMENT AND PLAN: This is a very pleasant 63 years old white female recently diagnosed with metastatic non-small cell lung cancer, adenocarcinoma presented with right lower lobe lung mass in addition to mediastinal and supraclavicular lymphadenopathy as well as diffuse metastatic liver lesions and retroperitoneal lymphadenopathy. She underwent 3 cycles of systemic chemotherapy with carboplatin and Alimta but unfortunately has evidence for disease progression especially in the liver and new small left adrenal metastasis. She is currently undergoing second line chemotherapy with cisplatin and gemcitabine status post 11 cycles and tolerating it fairly well patient after reducing the dose of cisplatin. I recommended for her to continue the same treatment with reduced dose cisplatin and gemcitabine. She will start cycle #12 today. She would come back  for followup visit in 3 weeks with the next cycle of her treatment after repeating CT scan of the chest, abdomen and pelvis for restaging of her disease.. For the peripheral neuropathy, I recommended for her to take Cymbalta 30 mg by mouth each bedtime other than morning. She was advised to call immediately if she has any concerning symptoms in the interval. The patient voices understanding of current disease status and treatment options and is in agreement with the current care plan.  All questions were answered. The patient knows to call the clinic with any problems, questions or concerns. We can certainly see the patient much sooner if necessary.  ADDENDUM: During the treatment with cisplatin the patient had hypersensitivity reaction  to the drug which was discontinued. It will be listed as allergy and she will not receive cisplatin anymore.  Disclaimer: This note was dictated with voice recognition software. Similar sounding words can inadvertently be transcribed and may not be corrected upon review.

## 2014-07-08 NOTE — Telephone Encounter (Signed)
Per staff message and POF I have scheduled appts. Advised scheduler of appts. JMW  

## 2014-07-09 ENCOUNTER — Telehealth: Payer: Self-pay | Admitting: Internal Medicine

## 2014-07-09 ENCOUNTER — Encounter: Payer: Self-pay | Admitting: Nurse Practitioner

## 2014-07-09 ENCOUNTER — Ambulatory Visit (HOSPITAL_BASED_OUTPATIENT_CLINIC_OR_DEPARTMENT_OTHER): Payer: 59

## 2014-07-09 VITALS — BP 116/61 | HR 73 | Temp 98.0°F | Resp 18

## 2014-07-09 DIAGNOSIS — C3491 Malignant neoplasm of unspecified part of right bronchus or lung: Secondary | ICD-10-CM

## 2014-07-09 DIAGNOSIS — T451X5A Adverse effect of antineoplastic and immunosuppressive drugs, initial encounter: Secondary | ICD-10-CM

## 2014-07-09 DIAGNOSIS — T7840XA Allergy, unspecified, initial encounter: Secondary | ICD-10-CM | POA: Insufficient documentation

## 2014-07-09 DIAGNOSIS — D6481 Anemia due to antineoplastic chemotherapy: Secondary | ICD-10-CM | POA: Diagnosis not present

## 2014-07-09 DIAGNOSIS — C3431 Malignant neoplasm of lower lobe, right bronchus or lung: Secondary | ICD-10-CM

## 2014-07-09 MED ORDER — SODIUM CHLORIDE 0.9 % IV SOLN
250.0000 mL | Freq: Once | INTRAVENOUS | Status: AC
  Administered 2014-07-09: 250 mL via INTRAVENOUS

## 2014-07-09 MED ORDER — DIPHENHYDRAMINE HCL 25 MG PO CAPS
ORAL_CAPSULE | ORAL | Status: AC
  Filled 2014-07-09: qty 1

## 2014-07-09 MED ORDER — ACETAMINOPHEN 325 MG PO TABS
ORAL_TABLET | ORAL | Status: AC
  Filled 2014-07-09: qty 2

## 2014-07-09 MED ORDER — SODIUM CHLORIDE 0.9 % IJ SOLN
10.0000 mL | INTRAMUSCULAR | Status: AC | PRN
  Administered 2014-07-09: 10 mL
  Filled 2014-07-09: qty 10

## 2014-07-09 MED ORDER — DIPHENHYDRAMINE HCL 25 MG PO CAPS
25.0000 mg | ORAL_CAPSULE | Freq: Once | ORAL | Status: AC
  Administered 2014-07-09: 25 mg via ORAL

## 2014-07-09 MED ORDER — HEPARIN SOD (PORK) LOCK FLUSH 100 UNIT/ML IV SOLN
500.0000 [IU] | Freq: Every day | INTRAVENOUS | Status: AC | PRN
  Administered 2014-07-09: 500 [IU]
  Filled 2014-07-09: qty 5

## 2014-07-09 MED ORDER — ACETAMINOPHEN 325 MG PO TABS
650.0000 mg | ORAL_TABLET | Freq: Once | ORAL | Status: AC
  Administered 2014-07-09: 650 mg via ORAL

## 2014-07-09 NOTE — Assessment & Plan Note (Signed)
Hemoglobin 7.6; and patient to receive 2 units packed red blood cells tomorrow. Patient denies any worsening issues with either fatigue or shortness of breath with exertion.

## 2014-07-09 NOTE — Assessment & Plan Note (Addendum)
Patient presented today to receive cycle 12, day 1 of her gemcitabine/cisplatin chemotherapy regimen.  Patient received the gemcitabine portion of her chemotherapy with no difficulty.  However, patient did develop a hypersensitivity reaction once starting the cisplatin infusion.  She experienced some mild dyspnea and a scratchy throat.  She also had some mild facial flushing. Cisplatin was held.  Vital signs remained stable throughout.  Hypersensitivity reaction was managed per protocol. Decision was made to forego any further cisplatin today.  Also, plan is to discontinue any further cisplatin chemotherapy in the future as well.  Advised patient and her husband that would discuss revised care plan when patient returns 07/15/2014.

## 2014-07-09 NOTE — Assessment & Plan Note (Signed)
Patient experienced a mild hypersensitivity reaction to her cisplatin infusion today.  Cisplatin was immediately held.  Patient developed some mild dyspnea, throat scratchiness, and facial flushing.  Confirmed the patient was premedicated with Aloxi, Emend, and dexamethasone 12 mg.  Patient was given additional Solu-Medrol 125 mg IV, Benadryl 25 mg IV, and Pepcid 20 mg IV.  All vital signs remained stable throughout. Patient was monitored for approximately 90 minutes; and she was noted to have returned to baseline. The decision was made to discontinue any further cisplatin either today or any future therapy.

## 2014-07-09 NOTE — Patient Instructions (Signed)

## 2014-07-09 NOTE — Telephone Encounter (Signed)
RE: Per 11/12 POF  Received: Today    Curt Bears, MD  Selena Lesser            Lab only day of chemo       Previous Messages     ----- Message -----   From: Selena Lesser   Sent: 07/08/2014 12:48 PM    To: Curt Bears, MD  Subject: Per 11/12 POF                   Pt's husband states pt is not to get labs weekly but per 11/12 POF labs weekly, can you please assist me with this matter.    Thanks    KIM

## 2014-07-09 NOTE — Telephone Encounter (Signed)
gv and printed appt sched and avs for pt for NOV adn DEC.Marland KitchenMarland Kitchen

## 2014-07-09 NOTE — Progress Notes (Signed)
_   SYMPTOM MANAGEMENT CLINIC   HPI: Amber Hayden 63 y.o. female diagnosed with lung cancer.  Currently undergoing gemcitabine/cisplatin therapy regimen.  Patient presented today for cycle 12, day 1 of her cisplatin/gemcitabine chemotherapy therapy regimen.  She had completed the gemcitabine portion of her chemotherapy; and had just initiated the cisplatin portion of her chemotherapy- when she developed a mild hypersensitivity reaction which consisted of mild dyspnea, scratchy throat, and facial flushing.  Cisplatin infusion was immediately held; and hypersensitivity protocol was initiated.  HPI  CURRENT THERAPY: Upcoming Treatment Dates - LUNG Cisplatin D1 / Gemcitabine D1,8 q21d Days with orders from any treatment category:  07/15/2014      SCHEDULING COMMUNICATION      prochlorperazine (COMPAZINE) tablet 10 mg      Gemcitabine HCl (GEMZAR) 1,178 mg in sodium chloride 0.9 % 100 mL chemo infusion      sodium chloride 0.9 % injection 10 mL      heparin lock flush 100 unit/mL      heparin lock flush 100 unit/mL      alteplase (CATHFLO ACTIVASE) injection 2 mg      sodium chloride 0.9 % injection 3 mL      Cold Pack 1 packet      0.9 %  sodium chloride infusion      TREATMENT CONDITIONS 07/29/2014      SCHEDULING COMMUNICATION      palonosetron (ALOXI) injection 0.25 mg      Dexamethasone Sodium Phosphate (DECADRON) injection 12 mg      fosaprepitant (EMEND) 150 mg in sodium chloride 0.9 % 145 mL IVPB      Gemcitabine HCl (GEMZAR) 1,178 mg in sodium chloride 0.9 % 100 mL chemo infusion      CISplatin (PLATINOL) 88 mg in sodium chloride 0.9 % 250 mL chemo infusion      sodium chloride 0.9 % injection 10 mL      heparin lock flush 100 unit/mL      heparin lock flush 100 unit/mL      alteplase (CATHFLO ACTIVASE) injection 2 mg      sodium chloride 0.9 % injection 3 mL      Cold Pack 1 packet      0.9 %  sodium chloride infusion      dextrose 5 % and 0.45% NaCl 1,000 mL with  potassium chloride 20 mEq, magnesium sulfate 12 mEq, mannitol 12.5 g infusion      TREATMENT CONDITIONS 08/05/2014      SCHEDULING COMMUNICATION      prochlorperazine (COMPAZINE) tablet 10 mg      Gemcitabine HCl (GEMZAR) 1,178 mg in sodium chloride 0.9 % 100 mL chemo infusion      sodium chloride 0.9 % injection 10 mL      heparin lock flush 100 unit/mL      heparin lock flush 100 unit/mL      alteplase (CATHFLO ACTIVASE) injection 2 mg      sodium chloride 0.9 % injection 3 mL      Cold Pack 1 packet      0.9 %  sodium chloride infusion      TREATMENT CONDITIONS    ROS  Past Medical History  Diagnosis Date  . HTN (hypertension)   . lung ca dx'd 07/2013    History reviewed. No pertinent past surgical history.  has HTN (hypertension); Tobacco use disorder; Cancer of right lung; Nausea with vomiting; Neuropathy due to chemotherapeutic drug; Hearing loss; Hypersensitivity reaction; and Anemia  associated with chemotherapy on her problem list.     is allergic to cisplatin; hctz; and plendil.    Medication List       This list is accurate as of: 07/08/14 11:59 PM.  Always use your most recent med list.               amLODipine 10 MG tablet  Commonly known as:  NORVASC  Take 10 mg by mouth every morning.     aspirin EC 81 MG tablet  Take 81 mg by mouth every morning.     calcium carbonate 500 MG chewable tablet  Commonly known as:  TUMS - dosed in mg elemental calcium  Chew 1 tablet by mouth 3 (three) times daily as needed for indigestion or heartburn.     DULoxetine 30 MG capsule  Commonly known as:  CYMBALTA  Take 1 capsule (30 mg total) by mouth daily.     lidocaine-prilocaine cream  Commonly known as:  EMLA  Apply 1 application topically as needed. Apply to port 1 hour before chemo appointment     lisinopril 20 MG tablet  Commonly known as:  PRINIVIL,ZESTRIL  Take 20 mg by mouth every morning.     magnesium oxide 400 (241.3 MG) MG tablet  Commonly known  as:  MAG-OX  Take 1 tablet (400 mg total) by mouth 2 (two) times daily.     nebivolol 10 MG tablet  Commonly known as:  BYSTOLIC  Take 10 mg by mouth every morning.     omeprazole 40 MG capsule  Commonly known as:  PRILOSEC  Take 40 mg by mouth daily.     oxyCODONE 5 MG immediate release tablet  Commonly known as:  ROXICODONE  Take 1 tablet (5 mg total) by mouth every 6 (six) hours as needed for severe pain.     prochlorperazine 10 MG tablet  Commonly known as:  COMPAZINE  Take 1 tablet (10 mg total) by mouth every 6 (six) hours as needed for nausea or vomiting.         PHYSICAL EXAMINATION  Vitals: BP 120/54, HR 74, temp 98, sat 99  Physical Exam  Constitutional: She is oriented to person, place, and time. Vital signs are normal. She appears unhealthy.  HENT:  Head: Normocephalic and atraumatic.  Eyes: Conjunctivae and EOM are normal. Pupils are equal, round, and reactive to light. No scleral icterus.  Neck: Normal range of motion.  Cardiovascular: Normal rate, regular rhythm, normal heart sounds and intact distal pulses.   Pulmonary/Chest: Effort normal and breath sounds normal. No respiratory distress. She has no wheezes. She has no rales.  Abdominal: Soft. Bowel sounds are normal.  Musculoskeletal: Normal range of motion. She exhibits no edema or tenderness.  Neurological: She is alert and oriented to person, place, and time. Gait normal.  Skin: Skin is warm and dry. No rash noted. No erythema.  Psychiatric: Affect normal.  Nursing note and vitals reviewed.   LABORATORY DATA:. Office Visit on 07/08/2014  Component Date Value Ref Range Status  . Hold Tube, Blood Bank 07/08/2014 Type and Crossmatch Added   Final  Hospital Outpatient Visit on 07/08/2014  Component Date Value Ref Range Status  . Order Confirmation 07/08/2014 ORDER PROCESSED BY BLOOD BANK   Final  Appointment on 07/08/2014  Component Date Value Ref Range Status  . WBC 07/08/2014 3.5* 3.9 - 10.3  10e3/uL Final  . NEUT# 07/08/2014 1.8  1.5 - 6.5 10e3/uL Final  . HGB 07/08/2014 7.6* 11.6 -  15.9 g/dL Final  . HCT 07/08/2014 22.8* 34.8 - 46.6 % Final  . Platelets 07/08/2014 105* 145 - 400 10e3/uL Final  . MCV 07/08/2014 94.2  79.5 - 101.0 fL Final  . MCH 07/08/2014 31.4  25.1 - 34.0 pg Final  . MCHC 07/08/2014 33.3  31.5 - 36.0 g/dL Final  . RBC 07/08/2014 2.42* 3.70 - 5.45 10e6/uL Final  . RDW 07/08/2014 19.2* 11.2 - 14.5 % Final  . lymph# 07/08/2014 1.1  0.9 - 3.3 10e3/uL Final  . MONO# 07/08/2014 0.6  0.1 - 0.9 10e3/uL Final  . Eosinophils Absolute 07/08/2014 0.0  0.0 - 0.5 10e3/uL Final  . Basophils Absolute 07/08/2014 0.0  0.0 - 0.1 10e3/uL Final  . NEUT% 07/08/2014 50.4  38.4 - 76.8 % Final  . LYMPH% 07/08/2014 31.2  14.0 - 49.7 % Final  . MONO% 07/08/2014 17.8* 0.0 - 14.0 % Final  . EOS% 07/08/2014 0.3  0.0 - 7.0 % Final  . BASO% 07/08/2014 0.3  0.0 - 2.0 % Final  . Sodium 07/08/2014 133* 136 - 145 mEq/L Final  . Potassium 07/08/2014 4.2  3.5 - 5.1 mEq/L Final  . Chloride 07/08/2014 101  98 - 109 mEq/L Final  . CO2 07/08/2014 25  22 - 29 mEq/L Final  . Glucose 07/08/2014 101  70 - 140 mg/dl Final  . BUN 07/08/2014 14.1  7.0 - 26.0 mg/dL Final  . Creatinine 07/08/2014 1.0  0.6 - 1.1 mg/dL Final  . Total Bilirubin 07/08/2014 0.26  0.20 - 1.20 mg/dL Final  . Alkaline Phosphatase 07/08/2014 94  40 - 150 U/L Final  . AST 07/08/2014 18  5 - 34 U/L Final  . ALT 07/08/2014 11  0 - 55 U/L Final  . Total Protein 07/08/2014 6.3* 6.4 - 8.3 g/dL Final  . Albumin 07/08/2014 3.5  3.5 - 5.0 g/dL Final  . Calcium 07/08/2014 9.3  8.4 - 10.4 mg/dL Final  . Anion Gap 07/08/2014 8  3 - 11 mEq/L Final  . Magnesium 07/08/2014 2.1  1.5 - 2.5 mg/dl Final  . ABO/RH(D) 07/08/2014 O POS   Final  . Antibody Screen 07/08/2014 NEG   Final  . Sample Expiration 07/08/2014 07/11/2014   Final  . Unit Number 07/08/2014 [id number]   Final  . Blood Component Type 07/08/2014 RED CELLS,LR   Final  .  Unit division 07/08/2014 00   Final  . Status of Unit 07/08/2014 ISSUED   Final  . Transfusion Status 07/08/2014 OK TO TRANSFUSE   Final  . Crossmatch Result 07/08/2014 Compatible   Final  . Unit Number 07/08/2014 [id number]   Final  . Blood Component Type 07/08/2014 RED CELLS,LR   Final  . Unit division 07/08/2014 00   Final  . Status of Unit 07/08/2014 ISSUED   Final  . Transfusion Status 07/08/2014 OK TO TRANSFUSE   Final  . Crossmatch Result 07/08/2014 Compatible   Final     RADIOGRAPHIC STUDIES: No results found.  ASSESSMENT/PLAN:    Lung cancer: Patient presented today to receive cycle 12, day 1 of her gemcitabine/cisplatin chemotherapy regimen.  Patient received the gemcitabine portion of her chemotherapy with no difficulty.  However, patient did develop a hypersensitivity reaction once starting the cisplatin infusion.  She experienced some mild dyspnea and a scratchy throat.  She also had some mild facial flushing. Cisplatin was held.  Vital signs remained stable throughout.  Hypersensitivity reaction was managed per protocol. Decision was made to forego any further cisplatin today.  Also,  plan is to discontinue any further cisplatin chemotherapy in the future as well.  Advised patient and her husband that would discuss revised care plan when patient returns 07/15/2014.   Anemia: Hemoglobin 7.6; and patient to receive 2 units packed red blood cells tomorrow. Patient denies any worsening issues with either fatigue or shortness of breath with exertion.   Hypersensitivity reaction: Patient experienced a mild hypersensitivity reaction to her cisplatin infusion today.  Cisplatin was immediately held.  Patient developed some mild dyspnea, throat scratchiness, and facial flushing.  Confirmed the patient was premedicated with Aloxi, Emend, and dexamethasone 12 mg.  Patient was given additional Solu-Medrol 125 mg IV, Benadryl 25 mg IV, and Pepcid 20 mg IV.  All vital signs remained stable  throughout. Patient was monitored for approximately 90 minutes; and she was noted to have returned to baseline. The decision was made to discontinue any further cisplatin either today or any future therapy.                                                                                Patient stated understanding of all instructions; and was in agreement with this plan of care. The patient knows to call the clinic with any problems, questions or concerns.   All results and findings were reviewed with Dr. Julien Nordmann today.   Total time spent with patient was 25 minutes;  with greater than 75 percent of that time spent in face to face counseling regarding her symptoms, frequent monitoring of patient while in the infusion area, and coordination of care and follow up.  Disclaimer: This note was dictated with voice recognition software. Similar sounding words can inadvertently be transcribed and may not be corrected upon review.   Drue Second, NP 07/09/2014

## 2014-07-10 ENCOUNTER — Encounter: Payer: Self-pay | Admitting: Internal Medicine

## 2014-07-10 LAB — TYPE AND SCREEN
ABO/RH(D): O POS
Antibody Screen: NEGATIVE
Unit division: 0
Unit division: 0

## 2014-07-12 ENCOUNTER — Telehealth: Payer: Self-pay | Admitting: Internal Medicine

## 2014-07-12 NOTE — Telephone Encounter (Signed)
s.w. pt husband and advised on NOV appt...ok and aware

## 2014-07-12 NOTE — Telephone Encounter (Signed)
pt husband called to confirm next appt due to he received a message from the scheduler re changing appt from 11/18 tp 11/19 but still received the phone tree message for the 11/18 appt. confirmed w/husband 11/18 is cancelled and next appt is 11/19 lb/flush/fu/tx @ 12:45pm.

## 2014-07-14 ENCOUNTER — Ambulatory Visit: Payer: 59 | Admitting: Physician Assistant

## 2014-07-14 ENCOUNTER — Other Ambulatory Visit: Payer: 59

## 2014-07-15 ENCOUNTER — Other Ambulatory Visit (HOSPITAL_BASED_OUTPATIENT_CLINIC_OR_DEPARTMENT_OTHER): Payer: 59

## 2014-07-15 ENCOUNTER — Ambulatory Visit: Payer: 59

## 2014-07-15 ENCOUNTER — Ambulatory Visit (HOSPITAL_BASED_OUTPATIENT_CLINIC_OR_DEPARTMENT_OTHER): Payer: 59

## 2014-07-15 ENCOUNTER — Ambulatory Visit (HOSPITAL_BASED_OUTPATIENT_CLINIC_OR_DEPARTMENT_OTHER): Payer: 59 | Admitting: Adult Health

## 2014-07-15 ENCOUNTER — Other Ambulatory Visit: Payer: 59

## 2014-07-15 ENCOUNTER — Encounter: Payer: Self-pay | Admitting: Adult Health

## 2014-07-15 VITALS — BP 126/71 | HR 76 | Temp 98.4°F | Resp 18 | Ht 62.0 in | Wt 111.3 lb

## 2014-07-15 DIAGNOSIS — C3491 Malignant neoplasm of unspecified part of right bronchus or lung: Secondary | ICD-10-CM

## 2014-07-15 DIAGNOSIS — C3431 Malignant neoplasm of lower lobe, right bronchus or lung: Secondary | ICD-10-CM

## 2014-07-15 DIAGNOSIS — G609 Hereditary and idiopathic neuropathy, unspecified: Secondary | ICD-10-CM

## 2014-07-15 DIAGNOSIS — C787 Secondary malignant neoplasm of liver and intrahepatic bile duct: Secondary | ICD-10-CM

## 2014-07-15 DIAGNOSIS — R112 Nausea with vomiting, unspecified: Secondary | ICD-10-CM

## 2014-07-15 DIAGNOSIS — Z5111 Encounter for antineoplastic chemotherapy: Secondary | ICD-10-CM

## 2014-07-15 DIAGNOSIS — Z95828 Presence of other vascular implants and grafts: Secondary | ICD-10-CM

## 2014-07-15 DIAGNOSIS — C7970 Secondary malignant neoplasm of unspecified adrenal gland: Secondary | ICD-10-CM

## 2014-07-15 DIAGNOSIS — C221 Intrahepatic bile duct carcinoma: Secondary | ICD-10-CM

## 2014-07-15 LAB — CBC WITH DIFFERENTIAL/PLATELET
BASO%: 0 % (ref 0.0–2.0)
Basophils Absolute: 0 10*3/uL (ref 0.0–0.1)
EOS ABS: 0 10*3/uL (ref 0.0–0.5)
EOS%: 0.3 % (ref 0.0–7.0)
HCT: 32.6 % — ABNORMAL LOW (ref 34.8–46.6)
HGB: 11.1 g/dL — ABNORMAL LOW (ref 11.6–15.9)
LYMPH%: 39.7 % (ref 14.0–49.7)
MCH: 31 pg (ref 25.1–34.0)
MCHC: 34 g/dL (ref 31.5–36.0)
MCV: 91.1 fL (ref 79.5–101.0)
MONO#: 0.5 10*3/uL (ref 0.1–0.9)
MONO%: 11.5 % (ref 0.0–14.0)
NEUT%: 48.5 % (ref 38.4–76.8)
NEUTROS ABS: 1.9 10*3/uL (ref 1.5–6.5)
PLATELETS: 124 10*3/uL — AB (ref 145–400)
RBC: 3.58 10*6/uL — AB (ref 3.70–5.45)
RDW: 17.2 % — AB (ref 11.2–14.5)
WBC: 3.9 10*3/uL (ref 3.9–10.3)
lymph#: 1.6 10*3/uL (ref 0.9–3.3)

## 2014-07-15 LAB — COMPREHENSIVE METABOLIC PANEL (CC13)
ALT: 33 U/L (ref 0–55)
AST: 25 U/L (ref 5–34)
Albumin: 3.5 g/dL (ref 3.5–5.0)
Alkaline Phosphatase: 115 U/L (ref 40–150)
Anion Gap: 9 mEq/L (ref 3–11)
BILIRUBIN TOTAL: 0.31 mg/dL (ref 0.20–1.20)
BUN: 22.6 mg/dL (ref 7.0–26.0)
CO2: 30 mEq/L — ABNORMAL HIGH (ref 22–29)
Calcium: 9.8 mg/dL (ref 8.4–10.4)
Chloride: 95 mEq/L — ABNORMAL LOW (ref 98–109)
Creatinine: 1.1 mg/dL (ref 0.6–1.1)
GLUCOSE: 105 mg/dL (ref 70–140)
Potassium: 4.5 mEq/L (ref 3.5–5.1)
SODIUM: 134 meq/L — AB (ref 136–145)
TOTAL PROTEIN: 6.5 g/dL (ref 6.4–8.3)

## 2014-07-15 LAB — MAGNESIUM (CC13): MAGNESIUM: 2.2 mg/dL (ref 1.5–2.5)

## 2014-07-15 MED ORDER — SODIUM CHLORIDE 0.9 % IV SOLN
800.0000 mg/m2 | Freq: Once | INTRAVENOUS | Status: AC
  Administered 2014-07-15: 1178 mg via INTRAVENOUS
  Filled 2014-07-15: qty 30.98

## 2014-07-15 MED ORDER — PROCHLORPERAZINE MALEATE 10 MG PO TABS
10.0000 mg | ORAL_TABLET | Freq: Once | ORAL | Status: AC
  Administered 2014-07-15: 10 mg via ORAL

## 2014-07-15 MED ORDER — HEPARIN SOD (PORK) LOCK FLUSH 100 UNIT/ML IV SOLN
500.0000 [IU] | Freq: Once | INTRAVENOUS | Status: AC | PRN
  Administered 2014-07-15: 500 [IU]
  Filled 2014-07-15: qty 5

## 2014-07-15 MED ORDER — SODIUM CHLORIDE 0.9 % IJ SOLN
10.0000 mL | INTRAMUSCULAR | Status: DC | PRN
  Administered 2014-07-15: 10 mL via INTRAVENOUS
  Filled 2014-07-15: qty 10

## 2014-07-15 MED ORDER — PROCHLORPERAZINE MALEATE 10 MG PO TABS
ORAL_TABLET | ORAL | Status: AC
  Filled 2014-07-15: qty 1

## 2014-07-15 MED ORDER — SODIUM CHLORIDE 0.9 % IV SOLN
Freq: Once | INTRAVENOUS | Status: AC
  Administered 2014-07-15: 14:00:00 via INTRAVENOUS

## 2014-07-15 MED ORDER — SODIUM CHLORIDE 0.9 % IJ SOLN
10.0000 mL | INTRAMUSCULAR | Status: DC | PRN
  Administered 2014-07-15: 10 mL
  Filled 2014-07-15: qty 10

## 2014-07-15 NOTE — Patient Instructions (Signed)
[facility] Discharge Instructions for Patients Receiving Chemotherapy  Today you received the following chemotherapy agents Gemzar To help prevent nausea and vomiting after your treatment, we encourage you to take your nausea medication as prescribed. If you develop nausea and vomiting that is not controlled by your nausea medication, call the clinic.   BELOW ARE SYMPTOMS THAT SHOULD BE REPORTED IMMEDIATELY:  *FEVER GREATER THAN 100.5 F  *CHILLS WITH OR WITHOUT FEVER  NAUSEA AND VOMITING THAT IS NOT CONTROLLED WITH YOUR NAUSEA MEDICATION  *UNUSUAL SHORTNESS OF BREATH  *UNUSUAL BRUISING OR BLEEDING  TENDERNESS IN MOUTH AND THROAT WITH OR WITHOUT PRESENCE OF ULCERS  *URINARY PROBLEMS  *BOWEL PROBLEMS  UNUSUAL RASH Items with * indicate a potential emergency and should be followed up as soon as possible.  Feel free to call the clinic you have any questions or concerns. The clinic phone number is [phone].

## 2014-07-15 NOTE — Patient Instructions (Signed)

## 2014-07-15 NOTE — Progress Notes (Signed)
[facility] [phone]   [phone]  OFFICE PROGRESS NOTE  Mathews Argyle, MD [address] [place] [address]  DIAGNOSIS: Stage IV (T1b, N2, M1b) non-small cell lung cancer, adenocarcinoma, presented with a right lower lobe lung mass as well as mediastinal, hilar, bilateral supraclavicular lymphadenopathy in addition to extensive liver metastases and retroperitoneal lymphadenopathy diagnosed in December of 2014.  MOLECULAR BIOMARKERS: [facility] ONE biomarker testing revealed TP53 E204*, KEAP1 R611f*25+, NFKBIA amplification, NKX2-1 amplification, SMARCA4 E463*. The patient was negative for EGFR and ALK gene translocation   PRIOR THERAPY: Systemic chemotherapy with carboplatin for AUC of 5 and Alimta 500 mg/M2 every 3 weeks, status post 3 cycles. First dose 09/02/2013.   CURRENT THERAPY: Systemic chemotherapy with cisplatin 60 mg/M2 on days 1 and gemcitabine 800 mg/M2 on days 1 and 8 every 3 weeks, first cycle on 11/12/2013. Currently cycle 12 day 8   CHEMOTHERAPY INTENT: palliative  CURRENT # OF CHEMOTHERAPY CYCLES: 12  CURRENT ANTIEMETICS: Zofran, dexamethasone and Compazine.  CURRENT SMOKING STATUS: former smoker  ORAL CHEMOTHERAPY AND CONSENT: None  CURRENT BISPHOSPHONATES USE: None  PAIN MANAGEMENT: 0/10  NARCOTICS INDUCED CONSTIPATION: None  LIVING WILL AND CODE STATUS: Full Code  INTERVAL HISTORY: Amber BELOTE89y.o. female returns to the clinic today for follow up visit accompanied by her husband. She is here today on cycle 12 day 8 of Gemcitabine/Cisplatin.  Today she will receive Gemcitabine alone.  She did experience shortness of breath with Cisplatin and the infusion was stopped and discontinued.  She is taking Cymbalta daily and her numbness is slightly improved.  The cymbalta does make her tired and she is taking it at night.  She denies any cancer related pain.  She did have one episode of nausea  /vomiting last week and continues to take her anti-emetics as prescribed.  She has repeat CT set up for 07/27/14 to evaluate her disease status.  She is otherwise well and w/o questions or concerns.   MEDICAL HISTORY: Past Medical History  Diagnosis Date  . HTN (hypertension)   . lung ca dx'd 07/2013    ALLERGIES:  is allergic to cisplatin; hctz; and plendil.  MEDICATIONS:  Current Outpatient Prescriptions  Medication Sig Dispense Refill  . amLODipine (NORVASC) 10 MG tablet Take 10 mg by mouth every morning.     .Marland Kitchenaspirin EC 81 MG tablet Take 81 mg by mouth every morning.     . calcium carbonate (TUMS - DOSED IN MG ELEMENTAL CALCIUM) 500 MG chewable tablet Chew 1 tablet by mouth 3 (three) times daily as needed for indigestion or heartburn.     . DULoxetine (CYMBALTA) 30 MG capsule Take 1 capsule (30 mg total) by mouth daily. 30 capsule 1  . lidocaine-prilocaine (EMLA) cream Apply 1 application topically as needed. Apply to port 1 hour before chemo appointment 30 g 0  . lisinopril (PRINIVIL,ZESTRIL) 20 MG tablet Take 20 mg by mouth every morning.     . magnesium oxide (MAG-OX) 400 (241.3 MG) MG tablet Take 1 tablet (400 mg total) by mouth 2 (two) times daily. 60 tablet 1  . nebivolol (BYSTOLIC) 10 MG tablet Take 10 mg by mouth every morning.     .Marland Kitchenomeprazole (PRILOSEC) 40 MG capsule Take 40 mg by mouth daily.    .Marland KitchenoxyCODONE (ROXICODONE) 5 MG immediate release tablet Take 1 tablet (5 mg total) by mouth every 6 (six) hours as needed for severe pain. [place]  tablet 0  . prochlorperazine (COMPAZINE) 10 MG tablet Take 1 tablet (10 mg total) by mouth every 6 (six) hours as needed for nausea or vomiting. 60 tablet 1   No current facility-administered medications for this visit.    REVIEW OF SYSTEMS:  A 10 point review of systems was conducted and is otherwise negative except for what is noted above.      PHYSICAL EXAMINATION: BP 126/71 mmHg  Pulse 76  Temp(Src) 98.4 F (36.9 C) (Oral)  Resp 18   Ht 5' 2"  (1.575 m)  Wt 111 lb 4.8 oz (50.485 kg)  BMI 20.35 kg/m2  SpO2 98% GENERAL: Patient is a well appearing female in no acute distress HEENT:  Sclerae anicteric.  Oropharynx clear and moist. No ulcerations or evidence of oropharyngeal candidiasis. Neck is supple.  NODES:  No cervical, supraclavicular, or axillary lymphadenopathy palpated.  BREAST EXAM:  Deferred. LUNGS:  Clear to auscultation bilaterally.  No wheezes or rhonchi. HEART:  Regular rate and rhythm. No murmur appreciated. ABDOMEN:  Soft, nontender.  Positive, normoactive bowel sounds. No organomegaly palpated. MSK:  No focal spinal tenderness to palpation. Full range of motion bilaterally in the upper extremities. EXTREMITIES:  No peripheral edema.   SKIN:  Clear with no obvious rashes or skin changes. No nail dyscrasia. NEURO:  Nonfocal. Well oriented.  Appropriate affect. ECOG PERFORMANCE STATUS: 1 - Symptomatic but completely ambulatory  LABORATORY DATA: Lab Results  Component Value Date   WBC 3.9 07/15/2014   HGB 11.1* 07/15/2014   HCT 32.6* 07/15/2014   MCV 91.1 07/15/2014   PLT 124* 07/15/2014      Chemistry      Component Value Date/Time   NA 133* 07/08/2014 0852   NA 135 05/13/2014 1007   K 4.2 07/08/2014 0852   K 4.5 05/13/2014 1007   CL 96 05/13/2014 1007   CO2 25 07/08/2014 0852   CO2 29 05/13/2014 1007   BUN 14.1 07/08/2014 0852   BUN 18 05/13/2014 1007   CREATININE 1.0 07/08/2014 0852   CREATININE 1.00 05/13/2014 1007      Component Value Date/Time   CALCIUM 9.3 07/08/2014 0852   CALCIUM 9.5 05/13/2014 1007       RADIOGRAPHIC STUDIES:  ASSESSMENT AND PLAN: This is a very pleasant 63 years old white female recently diagnosed with   1. metastatic non-small cell lung cancer, adenocarcinoma presented with right lower lobe lung mass in addition to mediastinal and supraclavicular lymphadenopathy as well as diffuse metastatic liver lesions and retroperitoneal lymphadenopathy. She  underwent 3 cycles of systemic chemotherapy with carboplatin and Alimta but unfortunately has evidence for disease progression especially in the liver and new small left adrenal metastasis.She is currently cycle 12 day 8 of Gemcitabine/Cisplatin and will proceed with Gemcitabine alone today.  Her labs are stable and I reviewed these with her in detail.  The working plan is for her to proceed with CT scans on 07/27/14 and f/u with Dr. Julien Nordmann on 07/29/14 to discuss these results and formulate a plan.     2. Peripheral Neuropathy: This is likely related to previous chemotherapy.  She is taking Cymbalta daily and notes that it is improved.  3. Allergic reaction to Cisplatin: The patient was reassured that she will no longer receive any Cisplatin due to the shortness of breath experienced during infusion last week.    The above plan was reviewed with Dr. Julien Nordmann and the patient/her husband in detail.  Everyone is in agreement and we will proceed with  the above plan.  All questions were answered. The patient knows to call the clinic with any problems, questions or concerns. We can certainly see the patient much sooner if necessary.  I spent 25 minutes counseling the patient face to face.  The total time spent in the appointment was 30 minutes.  Minette Headland, [place] [phone]

## 2014-07-27 ENCOUNTER — Encounter (HOSPITAL_COMMUNITY): Payer: Self-pay

## 2014-07-27 ENCOUNTER — Ambulatory Visit (HOSPITAL_COMMUNITY)
Admission: RE | Admit: 2014-07-27 | Discharge: 2014-07-27 | Disposition: A | Discharge status: Home / Self Care | Payer: 59 | Source: Ambulatory Visit | Attending: Internal Medicine | Admitting: Internal Medicine

## 2014-07-27 DIAGNOSIS — C3491 Malignant neoplasm of unspecified part of right bronchus or lung: Secondary | ICD-10-CM | POA: Diagnosis present

## 2014-07-27 DIAGNOSIS — D259 Leiomyoma of uterus, unspecified: Secondary | ICD-10-CM | POA: Insufficient documentation

## 2014-07-27 DIAGNOSIS — R911 Solitary pulmonary nodule: Secondary | ICD-10-CM | POA: Insufficient documentation

## 2014-07-27 DIAGNOSIS — C787 Secondary malignant neoplasm of liver and intrahepatic bile duct: Secondary | ICD-10-CM | POA: Insufficient documentation

## 2014-07-27 MED ORDER — IOHEXOL 300 MG/ML  SOLN
100.0000 mL | Freq: Once | INTRAMUSCULAR | Status: AC | PRN
  Administered 2014-07-27: 100 mL via INTRAVENOUS

## 2014-07-29 ENCOUNTER — Telehealth: Payer: Self-pay | Admitting: Internal Medicine

## 2014-07-29 ENCOUNTER — Encounter: Payer: Self-pay | Admitting: Internal Medicine

## 2014-07-29 ENCOUNTER — Ambulatory Visit: Payer: 59

## 2014-07-29 ENCOUNTER — Telehealth: Payer: Self-pay | Admitting: *Deleted

## 2014-07-29 ENCOUNTER — Ambulatory Visit (HOSPITAL_BASED_OUTPATIENT_CLINIC_OR_DEPARTMENT_OTHER): Payer: 59 | Admitting: Internal Medicine

## 2014-07-29 ENCOUNTER — Other Ambulatory Visit (HOSPITAL_BASED_OUTPATIENT_CLINIC_OR_DEPARTMENT_OTHER): Payer: 59

## 2014-07-29 ENCOUNTER — Ambulatory Visit (HOSPITAL_BASED_OUTPATIENT_CLINIC_OR_DEPARTMENT_OTHER): Payer: 59

## 2014-07-29 VITALS — BP 124/65 | HR 74 | Temp 97.8°F | Resp 18 | Ht 62.0 in | Wt 111.3 lb

## 2014-07-29 DIAGNOSIS — C3431 Malignant neoplasm of lower lobe, right bronchus or lung: Secondary | ICD-10-CM

## 2014-07-29 DIAGNOSIS — C221 Intrahepatic bile duct carcinoma: Secondary | ICD-10-CM

## 2014-07-29 DIAGNOSIS — C7970 Secondary malignant neoplasm of unspecified adrenal gland: Secondary | ICD-10-CM

## 2014-07-29 DIAGNOSIS — Z5111 Encounter for antineoplastic chemotherapy: Secondary | ICD-10-CM

## 2014-07-29 DIAGNOSIS — G609 Hereditary and idiopathic neuropathy, unspecified: Secondary | ICD-10-CM

## 2014-07-29 DIAGNOSIS — C3491 Malignant neoplasm of unspecified part of right bronchus or lung: Secondary | ICD-10-CM

## 2014-07-29 DIAGNOSIS — Z95828 Presence of other vascular implants and grafts: Secondary | ICD-10-CM

## 2014-07-29 DIAGNOSIS — C787 Secondary malignant neoplasm of liver and intrahepatic bile duct: Secondary | ICD-10-CM

## 2014-07-29 LAB — COMPREHENSIVE METABOLIC PANEL (CC13)
ALT: 23 U/L (ref 0–55)
AST: 25 U/L (ref 5–34)
Albumin: 3.3 g/dL — ABNORMAL LOW (ref 3.5–5.0)
Alkaline Phosphatase: 120 U/L (ref 40–150)
Anion Gap: 11 mEq/L (ref 3–11)
BUN: 16.4 mg/dL (ref 7.0–26.0)
CALCIUM: 9.5 mg/dL (ref 8.4–10.4)
CHLORIDE: 99 meq/L (ref 98–109)
CO2: 26 mEq/L (ref 22–29)
CREATININE: 1.1 mg/dL (ref 0.6–1.1)
EGFR: 55 mL/min/{1.73_m2} — ABNORMAL LOW (ref >90)
Glucose: 114 mg/dl (ref 70–140)
Potassium: 4 mEq/L (ref 3.5–5.1)
Sodium: 136 mEq/L (ref 136–145)
Total Bilirubin: 0.27 mg/dL (ref 0.20–1.20)
Total Protein: 6.2 g/dL — ABNORMAL LOW (ref 6.4–8.3)

## 2014-07-29 LAB — CBC WITH DIFFERENTIAL/PLATELET
BASO%: 0.2 % (ref 0.0–2.0)
Basophils Absolute: 0 10*3/uL (ref 0.0–0.1)
EOS%: 0.5 % (ref 0.0–7.0)
Eosinophils Absolute: 0 10*3/uL (ref 0.0–0.5)
HCT: 29.2 % — ABNORMAL LOW (ref 34.8–46.6)
HGB: 9.8 g/dL — ABNORMAL LOW (ref 11.6–15.9)
LYMPH%: 28 % (ref 14.0–49.7)
MCH: 31.1 pg (ref 25.1–34.0)
MCHC: 33.6 g/dL (ref 31.5–36.0)
MCV: 92.7 fL (ref 79.5–101.0)
MONO#: 0.9 10*3/uL (ref 0.1–0.9)
MONO%: 19.7 % — AB (ref 0.0–14.0)
NEUT#: 2.3 10*3/uL (ref 1.5–6.5)
NEUT%: 51.6 % (ref 38.4–76.8)
NRBC: 0 % (ref 0–0)
PLATELETS: 169 10*3/uL (ref 145–400)
RBC: 3.15 10*6/uL — ABNORMAL LOW (ref 3.70–5.45)
RDW: 18.6 % — ABNORMAL HIGH (ref 11.2–14.5)
WBC: 4.4 10*3/uL (ref 3.9–10.3)
lymph#: 1.2 10*3/uL (ref 0.9–3.3)

## 2014-07-29 MED ORDER — HEPARIN SOD (PORK) LOCK FLUSH 100 UNIT/ML IV SOLN
500.0000 [IU] | Freq: Once | INTRAVENOUS | Status: AC | PRN
  Administered 2014-07-29: 500 [IU]
  Filled 2014-07-29: qty 5

## 2014-07-29 MED ORDER — LIDOCAINE-PRILOCAINE 2.5-2.5 % EX CREA: 1.0000 "application " | TOPICAL_CREAM | CUTANEOUS | Status: DC | PRN

## 2014-07-29 MED ORDER — SODIUM CHLORIDE 0.9 % IV SOLN
1000.0000 mg/m2 | Freq: Once | INTRAVENOUS | Status: AC
  Administered 2014-07-29: 1482 mg via INTRAVENOUS
  Filled 2014-07-29: qty 38.98

## 2014-07-29 MED ORDER — SODIUM CHLORIDE 0.9 % IJ SOLN
10.0000 mL | INTRAMUSCULAR | Status: DC | PRN
  Administered 2014-07-29: 10 mL via INTRAVENOUS
  Filled 2014-07-29: qty 10

## 2014-07-29 MED ORDER — PROCHLORPERAZINE MALEATE 10 MG PO TABS
ORAL_TABLET | ORAL | Status: AC
  Filled 2014-07-29: qty 1

## 2014-07-29 MED ORDER — PROCHLORPERAZINE MALEATE 10 MG PO TABS
10.0000 mg | ORAL_TABLET | Freq: Once | ORAL | Status: AC
  Administered 2014-07-29: 10 mg via ORAL

## 2014-07-29 MED ORDER — SODIUM CHLORIDE 0.9 % IV SOLN
Freq: Once | INTRAVENOUS | Status: AC
  Administered 2014-07-29: 10:00:00 via INTRAVENOUS

## 2014-07-29 MED ORDER — SODIUM CHLORIDE 0.9 % IJ SOLN
10.0000 mL | INTRAMUSCULAR | Status: DC | PRN
  Administered 2014-07-29: 10 mL
  Filled 2014-07-29: qty 10

## 2014-07-29 NOTE — Patient Instructions (Signed)

## 2014-07-29 NOTE — Patient Instructions (Signed)
[facility] Discharge Instructions for Patients Receiving Chemotherapy  Today you received the following chemotherapy agents :  Gemcitabine.  To help prevent nausea and vomiting after your treatment, we encourage you to take your nausea medication as prescribed by your physician.   If you develop nausea and vomiting that is not controlled by your nausea medication, call the clinic.   BELOW ARE SYMPTOMS THAT SHOULD BE REPORTED IMMEDIATELY:  *FEVER GREATER THAN 100.5 F  *CHILLS WITH OR WITHOUT FEVER  NAUSEA AND VOMITING THAT IS NOT CONTROLLED WITH YOUR NAUSEA MEDICATION  *UNUSUAL SHORTNESS OF BREATH  *UNUSUAL BRUISING OR BLEEDING  TENDERNESS IN MOUTH AND THROAT WITH OR WITHOUT PRESENCE OF ULCERS  *URINARY PROBLEMS  *BOWEL PROBLEMS  UNUSUAL RASH Items with * indicate a potential emergency and should be followed up as soon as possible.  Feel free to call the clinic you have any questions or concerns. The clinic phone number is [phone].

## 2014-07-29 NOTE — Patient Instructions (Signed)
Smoking Cessation, Tips for Success  If you are ready to quit smoking, congratulations! You have chosen to help yourself be healthier. Cigarettes bring nicotine, tar, carbon monoxide, and other irritants into your body. Your lungs, heart, and blood vessels will be able to work better without these poisons. There are many different ways to quit smoking. Nicotine gum, nicotine patches, a nicotine inhaler, or nicotine nasal spray can help with physical craving. Hypnosis, support groups, and medicines help break the habit of smoking.  WHAT THINGS CAN I DO TO MAKE QUITTING EASIER?   Here are some tips to help you quit for good:  · Pick a date when you will quit smoking completely. Tell all of your friends and family about your plan to quit on that date.  · Do not try to slowly cut down on the number of cigarettes you are smoking. Pick a quit date and quit smoking completely starting on that day.  · Throw away all cigarettes.    · Clean and remove all ashtrays from your home, work, and car.  · On a card, write down your reasons for quitting. Carry the card with you and read it when you get the urge to smoke.  · Cleanse your body of nicotine. Drink enough water and fluids to keep your urine clear or pale yellow. Do this after quitting to flush the nicotine from your body.  · Learn to predict your moods. Do not let a bad situation be your excuse to have a cigarette. Some situations in your life might tempt you into wanting a cigarette.  · Never have "just one" cigarette. It leads to wanting another and another. Remind yourself of your decision to quit.  · Change habits associated with smoking. If you smoked while driving or when feeling stressed, try other activities to replace smoking. Stand up when drinking your coffee. Brush your teeth after eating. Sit in a different chair when you read the paper. Avoid alcohol while trying to quit, and try to drink fewer caffeinated beverages. Alcohol and caffeine may urge you to  smoke.  · Avoid foods and drinks that can trigger a desire to smoke, such as sugary or spicy foods and alcohol.  · Ask people who smoke not to smoke around you.  · Have something planned to do right after eating or having a cup of coffee. For example, plan to take a walk or exercise.  · Try a relaxation exercise to calm you down and decrease your stress. Remember, you may be tense and nervous for the first 2 weeks after you quit, but this will pass.  · Find new activities to keep your hands busy. Play with a pen, coin, or rubber band. Doodle or draw things on paper.  · Brush your teeth right after eating. This will help cut down on the craving for the taste of tobacco after meals. You can also try mouthwash.    · Use oral substitutes in place of cigarettes. Try using lemon drops, carrots, cinnamon sticks, or chewing gum. Keep them handy so they are available when you have the urge to smoke.  · When you have the urge to smoke, try deep breathing.  · Designate your home as a nonsmoking area.  · If you are a heavy smoker, ask your health care provider about a prescription for nicotine chewing gum. It can ease your withdrawal from nicotine.  · Reward yourself. Set aside the cigarette money you save and buy yourself something nice.  · Look for   support from others. Join a support group or smoking cessation program. Ask someone at home or at work to help you with your plan to quit smoking.  · Always ask yourself, "Do I need this cigarette or is this just a reflex?" Tell yourself, "Today, I choose not to smoke," or "I do not want to smoke." You are reminding yourself of your decision to quit.  · Do not replace cigarette smoking with electronic cigarettes (commonly called e-cigarettes). The safety of e-cigarettes is unknown, and some may contain harmful chemicals.  · If you relapse, do not give up! Plan ahead and think about what you will do the next time you get the urge to smoke.  HOW WILL I FEEL WHEN I QUIT SMOKING?  You  may have symptoms of withdrawal because your body is used to nicotine (the addictive substance in cigarettes). You may crave cigarettes, be irritable, feel very hungry, cough often, get headaches, or have difficulty concentrating. The withdrawal symptoms are only temporary. They are strongest when you first quit but will go away within 10-14 days. When withdrawal symptoms occur, stay in control. Think about your reasons for quitting. Remind yourself that these are signs that your body is healing and getting used to being without cigarettes. Remember that withdrawal symptoms are easier to treat than the major diseases that smoking can cause.   Even after the withdrawal is over, expect periodic urges to smoke. However, these cravings are generally short lived and will go away whether you smoke or not. Do not smoke!  WHAT RESOURCES ARE AVAILABLE TO HELP ME QUIT SMOKING?  Your health care provider can direct you to community resources or hospitals for support, which may include:  · Group support.  · Education.  · Hypnosis.  · Therapy.  Document Released: 05/11/2004 Document Revised: 12/28/2013 Document Reviewed: 01/29/2013  [employer]® Patient Information ©2015 [employer]. This information is not intended to replace advice given to you by your health care provider. Make sure you discuss any questions you have with your health care provider.

## 2014-07-29 NOTE — Progress Notes (Signed)
[place] [phone]   [phone]  OFFICE PROGRESS NOTE  Mathews Argyle, MD [address] [place] [address]  DIAGNOSIS: Stage IV (T1b, N2, M1b) non-small cell lung cancer, adenocarcinoma, presented with a right lower lobe lung mass as well as mediastinal, hilar, bilateral supraclavicular lymphadenopathy in addition to extensive liver metastases and retroperitoneal lymphadenopathy diagnosed in December of 2014.  MOLECULAR BIOMARKERS: [facility] biomarker testing revealed TP53 E204*, KEAP1 R642f*25+, NFKBIA amplification, NKX2-1 amplification, SMARCA4 E463*. The patient was negative for EGFR and ALK gene translocation   PRIOR THERAPY:  1) Systemic chemotherapy with carboplatin for AUC of 5 and Alimta 500 mg/M2 every 3 weeks, status post 3 cycles. First dose 09/02/2013.  2)  Systemic chemotherapy with cisplatin 60 mg/M2 on days 1 and gemcitabine 800 mg/M2 on days 1 and 8 every 3 weeks, first cycle on 11/12/2013. Status post 12 cycles. Discontinued secondary to hypersensitivity reaction to cisplatin during cycle #12.  CURRENT THERAPY: Systemic chemotherapy with gemcitabine 1000 mg/M2 on days 1 and 8 every 3 weeks, first cycle on 07/29/2014.    CHEMOTHERAPY INTENT: palliative  CURRENT # OF CHEMOTHERAPY CYCLES: 1  CURRENT ANTIEMETICS: Zofran, dexamethasone and Compazine.  CURRENT SMOKING STATUS: former smoker  ORAL CHEMOTHERAPY AND CONSENT: None  CURRENT BISPHOSPHONATES USE: None  PAIN MANAGEMENT: 0/10  NARCOTICS INDUCED CONSTIPATION: None  LIVING WILL AND CODE STATUS: Full Code  INTERVAL HISTORY: Amber [id number].o. female returns to the clinic today for follow up visit accompanied by her husband. The patient is feeling fine today with no specific complaints except for fatigue and and the persistent mild peripheral neuropathy. She is now taking Cymbalta at nighttime and tolerating it well. She noticed mild  improvement in her neuropathy. The patient had hypersensitivity reaction to cisplatin during cycle #12 and this was discontinued and she received treatment with gemcitabine only as a single agent.  The patient denied having any significant nausea or vomiting. She denied having any fever or chills. She has no significant weight loss or night sweats. She denied having any significant chest pain, shortness of breath, cough or hemoptysis. She had repeat CT scan of the chest, abdomen and pelvis performed recently and she is here for evaluation and discussion of her scan results.  MEDICAL HISTORY: Past Medical History  Diagnosis Date  . HTN (hypertension)   . lung ca dx'd 07/2013    ALLERGIES:  is allergic to cisplatin; hctz; and plendil.  MEDICATIONS:  Current Outpatient Prescriptions  Medication Sig Dispense Refill  . amLODipine (NORVASC) 10 MG tablet Take 10 mg by mouth every morning.     .Marland Kitchenaspirin EC 81 MG tablet Take 81 mg by mouth every morning.     . calcium carbonate (TUMS - DOSED IN MG ELEMENTAL CALCIUM) 500 MG chewable tablet Chew 1 tablet by mouth 3 (three) times daily as needed for indigestion or heartburn.     . DULoxetine (CYMBALTA) 30 MG capsule Take 1 capsule (30 mg total) by mouth daily. 30 capsule 1  . lidocaine-prilocaine (EMLA) cream Apply 1 application topically as needed. Apply to port 1 hour before chemo appointment 30 g 0  . lisinopril (PRINIVIL,ZESTRIL) 20 MG tablet Take 20 mg by mouth every morning.     . magnesium oxide (MAG-OX) 400 (241.3 MG) MG tablet Take 1 tablet (400 mg total) by mouth 2 (two) times daily. 60 tablet 1  . nebivolol (BYSTOLIC) 10 MG tablet Take 10 mg by  mouth every morning.     Marland Kitchen omeprazole (PRILOSEC) 40 MG capsule Take 40 mg by mouth daily.    Marland Kitchen oxyCODONE (ROXICODONE) 5 MG immediate release tablet Take 1 tablet (5 mg total) by mouth every 6 (six) hours as needed for severe pain. 60 tablet 0  . prochlorperazine (COMPAZINE) 10 MG tablet Take 1 tablet  (10 mg total) by mouth every 6 (six) hours as needed for nausea or vomiting. 60 tablet 1   No current facility-administered medications for this visit.    REVIEW OF SYSTEMS:  Constitutional: positive for fatigue Eyes: negative Ears, nose, mouth, throat, and face: negative Respiratory: negative Cardiovascular: negative Gastrointestinal: negative Genitourinary:negative Integument/breast: negative Hematologic/lymphatic: negative Musculoskeletal:negative Neurological: positive for paresthesia Behavioral/Psych: negative Endocrine: negative Allergic/Immunologic: negative   PHYSICAL EXAMINATION: General appearance: alert, cooperative and no distress Head: Normocephalic, without obvious abnormality, atraumatic Neck: no adenopathy, no JVD, supple, symmetrical, trachea midline and thyroid not enlarged, symmetric, no tenderness/mass/nodules Lymph nodes: Cervical, supraclavicular, and axillary nodes normal. Resp: clear to auscultation bilaterally Back: symmetric, no curvature. ROM normal. No CVA tenderness. Cardio: regular rate and rhythm, S1, S2 normal, no murmur, click, rub or gallop GI: soft, non-tender; bowel sounds normal; no masses,  no organomegaly Extremities: extremities normal, atraumatic, no cyanosis or edema Neurologic: Alert and oriented X 3, normal strength and tone. Normal symmetric reflexes. Normal coordination and gait  ECOG PERFORMANCE STATUS: 1 - Symptomatic but completely ambulatory  Blood pressure 124/65, pulse 74, temperature 97.8 F (36.6 C), temperature source Oral, resp. rate 18, height 5' 2"  (1.575 m), weight 111 lb 4.8 oz (50.485 kg).  LABORATORY DATA: Lab Results  Component Value Date   WBC 4.4 07/29/2014   HGB 9.8* 07/29/2014   HCT 29.2* 07/29/2014   MCV 92.7 07/29/2014   PLT 169 07/29/2014      Chemistry      Component Value Date/Time   NA 134* 07/15/2014 1232   NA 135 05/13/2014 1007   K 4.5 07/15/2014 1232   K 4.5 05/13/2014 1007   CL 96  05/13/2014 1007   CO2 30* 07/15/2014 1232   CO2 29 05/13/2014 1007   BUN 22.6 07/15/2014 1232   BUN 18 05/13/2014 1007   CREATININE 1.1 07/15/2014 1232   CREATININE 1.00 05/13/2014 1007      Component Value Date/Time   CALCIUM 9.8 07/15/2014 1232   CALCIUM 9.5 05/13/2014 1007       RADIOGRAPHIC STUDIES: Ct Chest W Contrast  07/27/2014   CLINICAL DATA:  Lung cancer diagnosed in 2014. Chemotherapy and progress.  EXAM: CT CHEST, ABDOMEN, AND PELVIS WITH CONTRAST  TECHNIQUE: Multidetector CT imaging of the chest, abdomen and pelvis was performed following the standard protocol during bolus administration of intravenous contrast.  CONTRAST:  192m OMNIPAQUE IOHEXOL 300 MG/ML  SOLN  COMPARISON:  Multiple prior chest CTs and PET-CT 08/26/2013  FINDINGS: CT CHEST FINDINGS  Chest wall: Stable right-sided Port-A-Cath. No breast masses, supraclavicular or axillary lymphadenopathy. The thyroid gland is grossly normal. The bony thorax is intact. No destructive bone lesions or spinal canal compromise.  Mediastinum: The heart is normal in size. No pericardial effusion. No mediastinal or hilar mass or lymphadenopathy. The aorta demonstrates stable atherosclerotic calcifications. No dissection or aneurysm. The esophagus is grossly normal. There is a small hiatal hernia.  Lungs: Stable biapical parenchymal scarring changes. Stable emphysema. The right lower lobe spiculated lung mass measures 14 x 12 mm and is stable. Probable surrounding radiation changes. No other pulmonary lesions.  CT ABDOMEN AND  PELVIS FINDINGS  Numerous hepatic lesions continue to decrease in size. The segment 7 lesion measures a maximum of 2.2 cm and previously measured 2.9 cm. The segment 8 lesion measures a maximum of 2.4 cm and previously measured 2.7 cm. Segment 2 lesion measures a maximum of 14 mm and previously measured 18 mm. No new lesions. The pancreas, spleen, adrenal glands and kidneys are unremarkable and stable.  The stomach is  moderately distended with contrast, fluid and air. The small bowel and colon are grossly normal. Moderate stool throughout the colon may suggest constipation. No inflammatory changes, mass lesions or obstructive findings.  No mesenteric or retroperitoneal mass or adenopathy. Stable advanced atherosclerotic calcifications involving the aorta and branch vessels.  Stable large exophytic fibroid in the right pelvis. The ovaries appear normal. No pelvic mass or adenopathy. No inguinal mass or adenopathy.  IMPRESSION: 1. Stable right lower lobe pulmonary nodule with surrounding probable radiation changes. 2. No mediastinal or hilar lymphadenopathy. 3. Continued slight improvement in hepatic metastatic disease. 4. No abdominal/pelvic lymphadenopathy. Stable large exophytic fibroid in the right pelvis.   Electronically Signed   By: Kalman Jewels M.D.   On: 07/27/2014 16:42   Ct Abdomen Pelvis W Contrast  07/27/2014   CLINICAL DATA:  Lung cancer diagnosed in 2014. Chemotherapy and progress.  EXAM: CT CHEST, ABDOMEN, AND PELVIS WITH CONTRAST  TECHNIQUE: Multidetector CT imaging of the chest, abdomen and pelvis was performed following the standard protocol during bolus administration of intravenous contrast.  CONTRAST:  143m OMNIPAQUE IOHEXOL 300 MG/ML  SOLN  COMPARISON:  Multiple prior chest CTs and PET-CT 08/26/2013  FINDINGS: CT CHEST FINDINGS  Chest wall: Stable right-sided Port-A-Cath. No breast masses, supraclavicular or axillary lymphadenopathy. The thyroid gland is grossly normal. The bony thorax is intact. No destructive bone lesions or spinal canal compromise.  Mediastinum: The heart is normal in size. No pericardial effusion. No mediastinal or hilar mass or lymphadenopathy. The aorta demonstrates stable atherosclerotic calcifications. No dissection or aneurysm. The esophagus is grossly normal. There is a small hiatal hernia.  Lungs: Stable biapical parenchymal scarring changes. Stable emphysema. The right  lower lobe spiculated lung mass measures 14 x 12 mm and is stable. Probable surrounding radiation changes. No other pulmonary lesions.  CT ABDOMEN AND PELVIS FINDINGS  Numerous hepatic lesions continue to decrease in size. The segment 7 lesion measures a maximum of 2.2 cm and previously measured 2.9 cm. The segment 8 lesion measures a maximum of 2.4 cm and previously measured 2.7 cm. Segment 2 lesion measures a maximum of 14 mm and previously measured 18 mm. No new lesions. The pancreas, spleen, adrenal glands and kidneys are unremarkable and stable.  The stomach is moderately distended with contrast, fluid and air. The small bowel and colon are grossly normal. Moderate stool throughout the colon may suggest constipation. No inflammatory changes, mass lesions or obstructive findings.  No mesenteric or retroperitoneal mass or adenopathy. Stable advanced atherosclerotic calcifications involving the aorta and branch vessels.  Stable large exophytic fibroid in the right pelvis. The ovaries appear normal. No pelvic mass or adenopathy. No inguinal mass or adenopathy.  IMPRESSION: 1. Stable right lower lobe pulmonary nodule with surrounding probable radiation changes. 2. No mediastinal or hilar lymphadenopathy. 3. Continued slight improvement in hepatic metastatic disease. 4. No abdominal/pelvic lymphadenopathy. Stable large exophytic fibroid in the right pelvis.   Electronically Signed   By: MKalman JewelsM.D.   On: 07/27/2014 16:42    ASSESSMENT AND PLAN: This is a very  pleasant 63 years old white female recently diagnosed with metastatic non-small cell lung cancer, adenocarcinoma presented with right lower lobe lung mass in addition to mediastinal and supraclavicular lymphadenopathy as well as diffuse metastatic liver lesions and retroperitoneal lymphadenopathy. She underwent 3 cycles of systemic chemotherapy with carboplatin and Alimta but unfortunately has evidence for disease progression especially in the liver  and new small left adrenal metastasis. She is currently undergoing second line chemotherapy with cisplatin and gemcitabine status post 12 cycles and tolerating it fairly well patient after reducing the dose of cisplatin. This treatment was discontinued after the patient developed hypersensitivity reaction to cisplatin. The recent CT scan of the chest, abdomen and pelvis showed no evidence for disease progression. I discussed the scan results with the patient and her husband. I recommended for her to continue with systemic chemotherapy with single agent gemcitabine 1000 MG/M2 on days 1 and 8 every 3 weeks. She will start the first cycle of this treatment today. For the peripheral neuropathy, she will continue on Cymbalta 30 mg by mouth each bedtime other than morning. The patient would come back for follow-up visit in 3 weeks with the next cycle of her treatment. She was advised to call immediately if she has any concerning symptoms in the interval. The patient voices understanding of current disease status and treatment options and is in agreement with the current care plan.  All questions were answered. The patient knows to call the clinic with any problems, questions or concerns. We can certainly see the patient much sooner if necessary.  Disclaimer: This note was dictated with voice recognition software. Similar sounding words can inadvertently be transcribed and may not be corrected upon review.

## 2014-07-29 NOTE — Telephone Encounter (Signed)
Per staff message and POF I have scheduled appts. Advised scheduler of appts. JMW  

## 2014-07-29 NOTE — Telephone Encounter (Signed)
Gave avs & cal forDec. Sent mess to sch tx. °

## 2014-07-30 ENCOUNTER — Encounter: Payer: Self-pay | Admitting: *Deleted

## 2014-07-30 NOTE — [facility] Oncology Navigator Note (Unsigned)
Patient called.  She asked if I could send BP, weight, and O2 sats to Dr. Felipa Eth office.  I did with confirmation fax obtained.

## 2014-08-05 ENCOUNTER — Other Ambulatory Visit (HOSPITAL_BASED_OUTPATIENT_CLINIC_OR_DEPARTMENT_OTHER): Payer: 59

## 2014-08-05 ENCOUNTER — Ambulatory Visit (HOSPITAL_BASED_OUTPATIENT_CLINIC_OR_DEPARTMENT_OTHER): Payer: 59

## 2014-08-05 ENCOUNTER — Ambulatory Visit: Payer: 59

## 2014-08-05 DIAGNOSIS — C3431 Malignant neoplasm of lower lobe, right bronchus or lung: Secondary | ICD-10-CM

## 2014-08-05 DIAGNOSIS — C787 Secondary malignant neoplasm of liver and intrahepatic bile duct: Secondary | ICD-10-CM

## 2014-08-05 DIAGNOSIS — C221 Intrahepatic bile duct carcinoma: Secondary | ICD-10-CM

## 2014-08-05 DIAGNOSIS — Z5111 Encounter for antineoplastic chemotherapy: Secondary | ICD-10-CM

## 2014-08-05 DIAGNOSIS — Z95828 Presence of other vascular implants and grafts: Secondary | ICD-10-CM

## 2014-08-05 DIAGNOSIS — C3491 Malignant neoplasm of unspecified part of right bronchus or lung: Secondary | ICD-10-CM

## 2014-08-05 LAB — CBC WITH DIFFERENTIAL/PLATELET
BASO%: 0 % (ref 0.0–2.0)
BASOS ABS: 0 10*3/uL (ref 0.0–0.1)
EOS ABS: 0 10*3/uL (ref 0.0–0.5)
EOS%: 0 % (ref 0.0–7.0)
HCT: 29.7 % — ABNORMAL LOW (ref 34.8–46.6)
HEMOGLOBIN: 9.9 g/dL — AB (ref 11.6–15.9)
LYMPH%: 35.6 % (ref 14.0–49.7)
MCH: 30.9 pg (ref 25.1–34.0)
MCHC: 33.3 g/dL (ref 31.5–36.0)
MCV: 92.8 fL (ref 79.5–101.0)
MONO#: 0.5 10*3/uL (ref 0.1–0.9)
MONO%: 13.6 % (ref 0.0–14.0)
NEUT%: 50.8 % (ref 38.4–76.8)
NEUTROS ABS: 2 10*3/uL (ref 1.5–6.5)
PLATELETS: 196 10*3/uL (ref 145–400)
RBC: 3.2 10*6/uL — AB (ref 3.70–5.45)
RDW: 18.2 % — ABNORMAL HIGH (ref 11.2–14.5)
WBC: 3.9 10*3/uL (ref 3.9–10.3)
lymph#: 1.4 10*3/uL (ref 0.9–3.3)

## 2014-08-05 LAB — COMPREHENSIVE METABOLIC PANEL (CC13)
ALBUMIN: 3.6 g/dL (ref 3.5–5.0)
ALT: 36 U/L (ref 0–55)
ANION GAP: 12 meq/L — AB (ref 3–11)
AST: 30 U/L (ref 5–34)
Alkaline Phosphatase: 132 U/L (ref 40–150)
BUN: 14.5 mg/dL (ref 7.0–26.0)
CALCIUM: 9.8 mg/dL (ref 8.4–10.4)
CO2: 25 meq/L (ref 22–29)
Chloride: 98 mEq/L (ref 98–109)
Creatinine: 1.1 mg/dL (ref 0.6–1.1)
EGFR: 55 mL/min/{1.73_m2} — AB (ref >90)
GLUCOSE: 103 mg/dL (ref 70–140)
POTASSIUM: 4.4 meq/L (ref 3.5–5.1)
Sodium: 135 mEq/L — ABNORMAL LOW (ref 136–145)
Total Bilirubin: 0.38 mg/dL (ref 0.20–1.20)
Total Protein: 6.6 g/dL (ref 6.4–8.3)

## 2014-08-05 MED ORDER — HEPARIN SOD (PORK) LOCK FLUSH 100 UNIT/ML IV SOLN
500.0000 [IU] | Freq: Once | INTRAVENOUS | Status: AC | PRN
  Administered 2014-08-05: 500 [IU]
  Filled 2014-08-05: qty 5

## 2014-08-05 MED ORDER — SODIUM CHLORIDE 0.9 % IJ SOLN
10.0000 mL | INTRAMUSCULAR | Status: DC | PRN
  Administered 2014-08-05: 10 mL
  Filled 2014-08-05: qty 10

## 2014-08-05 MED ORDER — PROCHLORPERAZINE MALEATE 10 MG PO TABS
ORAL_TABLET | ORAL | Status: AC
  Filled 2014-08-05: qty 1

## 2014-08-05 MED ORDER — SODIUM CHLORIDE 0.9 % IV SOLN
Freq: Once | INTRAVENOUS | Status: AC
  Administered 2014-08-05: 10:00:00 via INTRAVENOUS

## 2014-08-05 MED ORDER — SODIUM CHLORIDE 0.9 % IJ SOLN
10.0000 mL | INTRAMUSCULAR | Status: DC | PRN
  Administered 2014-08-05: 10 mL via INTRAVENOUS
  Filled 2014-08-05: qty 10

## 2014-08-05 MED ORDER — PROCHLORPERAZINE MALEATE 10 MG PO TABS
10.0000 mg | ORAL_TABLET | Freq: Once | ORAL | Status: AC
  Administered 2014-08-05: 10 mg via ORAL

## 2014-08-05 MED ORDER — SODIUM CHLORIDE 0.9 % IV SOLN
1000.0000 mg/m2 | Freq: Once | INTRAVENOUS | Status: AC
  Administered 2014-08-05: 1482 mg via INTRAVENOUS
  Filled 2014-08-05: qty 38.98

## 2014-08-05 NOTE — Patient Instructions (Signed)

## 2014-08-05 NOTE — Patient Instructions (Signed)
[facility] Discharge Instructions for Patients Receiving Chemotherapy  Today you received the following chemotherapy agents: Gemzar  To help prevent nausea and vomiting after your treatment, we encourage you to take your nausea medication as prescribed by your physician.   If you develop nausea and vomiting that is not controlled by your nausea medication, call the clinic.   BELOW ARE SYMPTOMS THAT SHOULD BE REPORTED IMMEDIATELY:  *FEVER GREATER THAN 100.5 F  *CHILLS WITH OR WITHOUT FEVER  NAUSEA AND VOMITING THAT IS NOT CONTROLLED WITH YOUR NAUSEA MEDICATION  *UNUSUAL SHORTNESS OF BREATH  *UNUSUAL BRUISING OR BLEEDING  TENDERNESS IN MOUTH AND THROAT WITH OR WITHOUT PRESENCE OF ULCERS  *URINARY PROBLEMS  *BOWEL PROBLEMS  UNUSUAL RASH Items with * indicate a potential emergency and should be followed up as soon as possible.  Feel free to call the clinic you have any questions or concerns. The clinic phone number is [phone].

## 2014-08-19 ENCOUNTER — Other Ambulatory Visit (HOSPITAL_BASED_OUTPATIENT_CLINIC_OR_DEPARTMENT_OTHER): Payer: 59

## 2014-08-19 ENCOUNTER — Ambulatory Visit (HOSPITAL_BASED_OUTPATIENT_CLINIC_OR_DEPARTMENT_OTHER): Payer: 59

## 2014-08-19 ENCOUNTER — Telehealth: Payer: Self-pay | Admitting: Physician Assistant

## 2014-08-19 ENCOUNTER — Ambulatory Visit: Payer: 59

## 2014-08-19 ENCOUNTER — Telehealth: Payer: Self-pay | Admitting: *Deleted

## 2014-08-19 ENCOUNTER — Encounter: Payer: Self-pay | Admitting: Physician Assistant

## 2014-08-19 ENCOUNTER — Ambulatory Visit (HOSPITAL_BASED_OUTPATIENT_CLINIC_OR_DEPARTMENT_OTHER): Payer: 59 | Admitting: Physician Assistant

## 2014-08-19 VITALS — BP 136/63 | HR 73 | Temp 98.4°F | Resp 18 | Ht 62.0 in | Wt 114.8 lb

## 2014-08-19 DIAGNOSIS — C3431 Malignant neoplasm of lower lobe, right bronchus or lung: Secondary | ICD-10-CM

## 2014-08-19 DIAGNOSIS — C787 Secondary malignant neoplasm of liver and intrahepatic bile duct: Secondary | ICD-10-CM

## 2014-08-19 DIAGNOSIS — G629 Polyneuropathy, unspecified: Secondary | ICD-10-CM

## 2014-08-19 DIAGNOSIS — C221 Intrahepatic bile duct carcinoma: Secondary | ICD-10-CM

## 2014-08-19 DIAGNOSIS — Z5111 Encounter for antineoplastic chemotherapy: Secondary | ICD-10-CM

## 2014-08-19 DIAGNOSIS — C3490 Malignant neoplasm of unspecified part of unspecified bronchus or lung: Secondary | ICD-10-CM

## 2014-08-19 DIAGNOSIS — Z95828 Presence of other vascular implants and grafts: Secondary | ICD-10-CM

## 2014-08-19 DIAGNOSIS — C3491 Malignant neoplasm of unspecified part of right bronchus or lung: Secondary | ICD-10-CM

## 2014-08-19 LAB — CBC WITH DIFFERENTIAL/PLATELET
BASO%: 0.5 % (ref 0.0–2.0)
Basophils Absolute: 0 10*3/uL (ref 0.0–0.1)
EOS%: 0.6 % (ref 0.0–7.0)
Eosinophils Absolute: 0 10*3/uL (ref 0.0–0.5)
HCT: 26.5 % — ABNORMAL LOW (ref 34.8–46.6)
HGB: 8.7 g/dL — ABNORMAL LOW (ref 11.6–15.9)
LYMPH#: 1.1 10*3/uL (ref 0.9–3.3)
LYMPH%: 22.8 % (ref 14.0–49.7)
MCH: 31.6 pg (ref 25.1–34.0)
MCHC: 32.9 g/dL (ref 31.5–36.0)
MCV: 96 fL (ref 79.5–101.0)
MONO#: 0.8 10*3/uL (ref 0.1–0.9)
MONO%: 16.1 % — ABNORMAL HIGH (ref 0.0–14.0)
NEUT#: 2.9 10*3/uL (ref 1.5–6.5)
NEUT%: 60 % (ref 38.4–76.8)
Platelets: 150 10*3/uL (ref 145–400)
RBC: 2.76 10*6/uL — AB (ref 3.70–5.45)
RDW: 22 % — AB (ref 11.2–14.5)
WBC: 4.9 10*3/uL (ref 3.9–10.3)

## 2014-08-19 LAB — COMPREHENSIVE METABOLIC PANEL (CC13)
ALT: 28 U/L (ref 0–55)
AST: 27 U/L (ref 5–34)
Albumin: 3.4 g/dL — ABNORMAL LOW (ref 3.5–5.0)
Alkaline Phosphatase: 133 U/L (ref 40–150)
Anion Gap: 8 mEq/L (ref 3–11)
BUN: 13.2 mg/dL (ref 7.0–26.0)
CALCIUM: 9.3 mg/dL (ref 8.4–10.4)
CO2: 27 mEq/L (ref 22–29)
CREATININE: 1.1 mg/dL (ref 0.6–1.1)
Chloride: 101 mEq/L (ref 98–109)
EGFR: 55 mL/min/{1.73_m2} — ABNORMAL LOW (ref >90)
Glucose: 111 mg/dl (ref 70–140)
POTASSIUM: 4.2 meq/L (ref 3.5–5.1)
Sodium: 136 mEq/L (ref 136–145)
Total Bilirubin: 0.34 mg/dL (ref 0.20–1.20)
Total Protein: 6.3 g/dL — ABNORMAL LOW (ref 6.4–8.3)

## 2014-08-19 MED ORDER — SODIUM CHLORIDE 0.9 % IV SOLN
1000.0000 mg/m2 | Freq: Once | INTRAVENOUS | Status: AC
  Administered 2014-08-19: 1482 mg via INTRAVENOUS
  Filled 2014-08-19: qty 38.98

## 2014-08-19 MED ORDER — PROCHLORPERAZINE MALEATE 10 MG PO TABS
10.0000 mg | ORAL_TABLET | Freq: Once | ORAL | Status: AC
  Administered 2014-08-19: 10 mg via ORAL

## 2014-08-19 MED ORDER — HEPARIN SOD (PORK) LOCK FLUSH 100 UNIT/ML IV SOLN
500.0000 [IU] | Freq: Once | INTRAVENOUS | Status: AC | PRN
  Administered 2014-08-19: 500 [IU]
  Filled 2014-08-19: qty 5

## 2014-08-19 MED ORDER — PROCHLORPERAZINE MALEATE 10 MG PO TABS: 10.0000 mg | ORAL_TABLET | Freq: Four times a day (QID) | ORAL | Status: AC | PRN

## 2014-08-19 MED ORDER — SODIUM CHLORIDE 0.9 % IJ SOLN
10.0000 mL | INTRAMUSCULAR | Status: DC | PRN
  Administered 2014-08-19: 10 mL via INTRAVENOUS
  Filled 2014-08-19: qty 10

## 2014-08-19 MED ORDER — DULOXETINE HCL 30 MG PO CPEP: 30.0000 mg | ORAL_CAPSULE | Freq: Every day | ORAL | Status: DC

## 2014-08-19 MED ORDER — SODIUM CHLORIDE 0.9 % IV SOLN
Freq: Once | INTRAVENOUS | Status: AC
  Administered 2014-08-19: 11:00:00 via INTRAVENOUS

## 2014-08-19 MED ORDER — PROCHLORPERAZINE MALEATE 10 MG PO TABS
ORAL_TABLET | ORAL | Status: AC
  Filled 2014-08-19: qty 1

## 2014-08-19 MED ORDER — SODIUM CHLORIDE 0.9 % IJ SOLN
10.0000 mL | INTRAMUSCULAR | Status: DC | PRN
  Administered 2014-08-19: 10 mL
  Filled 2014-08-19: qty 10

## 2014-08-19 NOTE — Telephone Encounter (Signed)
Gave avs & cal for Dec/Jan. Sent mess to sch tx.

## 2014-08-19 NOTE — Patient Instructions (Signed)
Continue labs and chemotherapy as scheduled Follow up in 3 weeks, prior to the start of your next scheduled cycle of chemotherapy

## 2014-08-19 NOTE — Patient Instructions (Signed)

## 2014-08-19 NOTE — Telephone Encounter (Signed)
Per staff message and POF I have scheduled appts. Advised scheduler of appts. JMW  

## 2014-08-19 NOTE — Patient Instructions (Signed)
[facility] Discharge Instructions for Patients Receiving Chemotherapy  Today you received the following chemotherapy agents gemzar  To help prevent nausea and vomiting after your treatment, we encourage you to take your nausea medication as directed.  If you develop nausea and vomiting that is not controlled by your nausea medication, call the clinic.   BELOW ARE SYMPTOMS THAT SHOULD BE REPORTED IMMEDIATELY:  *FEVER GREATER THAN 100.5 F  *CHILLS WITH OR WITHOUT FEVER  NAUSEA AND VOMITING THAT IS NOT CONTROLLED WITH YOUR NAUSEA MEDICATION  *UNUSUAL SHORTNESS OF BREATH  *UNUSUAL BRUISING OR BLEEDING  TENDERNESS IN MOUTH AND THROAT WITH OR WITHOUT PRESENCE OF ULCERS  *URINARY PROBLEMS  *BOWEL PROBLEMS  UNUSUAL RASH Items with * indicate a potential emergency and should be followed up as soon as possible.  Feel free to call the clinic you have any questions or concerns. The clinic phone number is [phone].  

## 2014-08-19 NOTE — Progress Notes (Addendum)
[place] [phone]   [phone]  OFFICE PROGRESS NOTE  Mathews Argyle, MD [address] [place] [address]  DIAGNOSIS: Stage IV (T1b, N2, M1b) non-small cell lung cancer, adenocarcinoma, presented with a right lower lobe lung mass as well as mediastinal, hilar, bilateral supraclavicular lymphadenopathy in addition to extensive liver metastases and retroperitoneal lymphadenopathy diagnosed in December of 2014.  MOLECULAR BIOMARKERS: [facility] biomarker testing revealed TP53 E204*, KEAP1 R64f*25+, NFKBIA amplification, NKX2-1 amplification, SMARCA4 E463*. The patient was negative for EGFR and ALK gene translocation   PRIOR THERAPY:  1) Systemic chemotherapy with carboplatin for AUC of 5 and Alimta 500 mg/M2 every 3 weeks, status post 3 cycles. First dose 09/02/2013.  2)  Systemic chemotherapy with cisplatin 60 mg/M2 on days 1 and gemcitabine 800 mg/M2 on days 1 and 8 every 3 weeks, first cycle on 11/12/2013. Status post 12 cycles. Discontinued secondary to hypersensitivity reaction to cisplatin during cycle #12.  CURRENT THERAPY: Systemic chemotherapy with gemcitabine 1000 mg/M2 on days 1 and 8 every 3 weeks, first cycle on 07/29/2014. Status post 1 cycle   CHEMOTHERAPY INTENT: palliative  CURRENT # OF CHEMOTHERAPY CYCLES: 2  CURRENT ANTIEMETICS: Zofran, dexamethasone and Compazine.  CURRENT SMOKING STATUS: former smoker  ORAL CHEMOTHERAPY AND CONSENT: None  CURRENT BISPHOSPHONATES USE: None  PAIN MANAGEMENT: 0/10  NARCOTICS INDUCED CONSTIPATION: None  LIVING WILL AND CODE STATUS: Full Code  INTERVAL HISTORY: Amber SANJOSE63y.o. female returns to the clinic today for follow up visit accompanied by her husband. The patient is feeling fine today with no specific complaints except for fatigue and and the persistent mild peripheral neuropathy. She is now taking Cymbalta at nighttime and tolerating it well.  She noticed mild improvement in her neuropathy in her fingertips. She still has some significant peripheral neuropathy affecting her feet which is causing some imbalance issues. She has not sustained any recent falls. The patient had hypersensitivity reaction to cisplatin during cycle #12 and this was discontinued and she received treatment with gemcitabine only as a single agent.  The patient denied having any significant nausea or vomiting. The nausea that she does have is well-controlled with Compazine, average of 2 tablets daily. She denied having any fever or chills. She has no significant weight loss or night sweats. She denied having any significant chest pain, shortness of breath, cough or hemoptysis.   MEDICAL HISTORY: Past Medical History  Diagnosis Date  . HTN (hypertension)   . lung ca dx'd 07/2013    ALLERGIES:  is allergic to cisplatin; hctz; and plendil.  MEDICATIONS:  Current Outpatient Prescriptions  Medication Sig Dispense Refill  . amLODipine (NORVASC) 10 MG tablet Take 10 mg by mouth every morning.     .Marland Kitchenaspirin EC 81 MG tablet Take 81 mg by mouth every morning.     . calcium carbonate (TUMS - DOSED IN MG ELEMENTAL CALCIUM) 500 MG chewable tablet Chew 1 tablet by mouth 3 (three) times daily as needed for indigestion or heartburn.     . DULoxetine (CYMBALTA) 30 MG capsule Take 1 capsule (30 mg total) by mouth daily. 90 capsule 1  . lidocaine-prilocaine (EMLA) cream Apply 1 application topically as needed. Apply to port 1 hour before chemo appointment 30 g 0  . lisinopril (PRINIVIL,ZESTRIL) 20 MG tablet Take 20 mg by mouth every morning.     . magnesium oxide (MAG-OX) 400 (241.3 MG) MG tablet Take 1 tablet (400 mg total)  by mouth 2 (two) times daily. 60 tablet 1  . nebivolol (BYSTOLIC) 10 MG tablet Take 10 mg by mouth every morning.     Marland Kitchen omeprazole (PRILOSEC) 40 MG capsule Take 40 mg by mouth daily.    Marland Kitchen oxyCODONE (ROXICODONE) 5 MG immediate release tablet Take 1 tablet (5  mg total) by mouth every 6 (six) hours as needed for severe pain. 60 tablet 0  . prochlorperazine (COMPAZINE) 10 MG tablet Take 1 tablet (10 mg total) by mouth every 6 (six) hours as needed for nausea or vomiting. 90 tablet 1   No current facility-administered medications for this visit.   Facility-Administered Medications Ordered in Other Visits  Medication Dose Route Frequency Provider Last Rate Last Dose  . 0.9 %  sodium chloride infusion   Intravenous Once Curt Bears, MD      . Gemcitabine HCl (GEMZAR) 1,482 mg in sodium chloride 0.9 % 100 mL chemo infusion  1,000 mg/m2 (Treatment Plan Actual) Intravenous Once Curt Bears, MD 278 mL/hr at 08/19/14 1116 1,482 mg at 08/19/14 1116  . heparin lock flush 100 unit/mL  500 Units Intracatheter Once PRN Curt Bears, MD      . sodium chloride 0.9 % injection 10 mL  10 mL Intracatheter PRN Curt Bears, MD        REVIEW OF SYSTEMS:  Constitutional: positive for fatigue Eyes: negative Ears, nose, mouth, throat, and face: negative Respiratory: negative Cardiovascular: negative Gastrointestinal: positive for nausea Genitourinary:negative Integument/breast: negative Hematologic/lymphatic: negative Musculoskeletal:negative Neurological: positive for gait problems and paresthesia Behavioral/Psych: negative Endocrine: negative Allergic/Immunologic: negative   PHYSICAL EXAMINATION: General appearance: alert, cooperative and no distress Head: Normocephalic, without obvious abnormality, atraumatic Neck: no adenopathy, no JVD, supple, symmetrical, trachea midline and thyroid not enlarged, symmetric, no tenderness/mass/nodules Lymph nodes: Cervical, supraclavicular, and axillary nodes normal. Resp: clear to auscultation bilaterally Back: symmetric, no curvature. ROM normal. No CVA tenderness. Cardio: regular rate and rhythm, S1, S2 normal, no murmur, click, rub or gallop GI: soft, non-tender; bowel sounds normal; no masses,  no  organomegaly Extremities: extremities normal, atraumatic, no cyanosis or edema Neurologic: Grossly normal  ECOG PERFORMANCE STATUS: 1 - Symptomatic but completely ambulatory  Blood pressure 136/63, pulse 73, temperature 98.4 F (36.9 C), temperature source Oral, resp. rate 18, height 5' 2"  (1.575 m), weight 114 lb 12.8 oz (52.073 kg), SpO2 100 %.  LABORATORY DATA: Lab Results  Component Value Date   WBC 4.9 08/19/2014   HGB 8.7* 08/19/2014   HCT 26.5* 08/19/2014   MCV 96.0 08/19/2014   PLT 150 08/19/2014      Chemistry      Component Value Date/Time   NA 136 08/19/2014 0843   NA 135 05/13/2014 1007   K 4.2 08/19/2014 0843   K 4.5 05/13/2014 1007   CL 96 05/13/2014 1007   CO2 27 08/19/2014 0843   CO2 29 05/13/2014 1007   BUN 13.2 08/19/2014 0843   BUN 18 05/13/2014 1007   CREATININE 1.1 08/19/2014 0843   CREATININE 1.00 05/13/2014 1007      Component Value Date/Time   CALCIUM 9.3 08/19/2014 0843   CALCIUM 9.5 05/13/2014 1007       RADIOGRAPHIC STUDIES: Ct Chest W Contrast  07/27/2014   CLINICAL DATA:  Lung cancer diagnosed in 2014. Chemotherapy and progress.  EXAM: CT CHEST, ABDOMEN, AND PELVIS WITH CONTRAST  TECHNIQUE: Multidetector CT imaging of the chest, abdomen and pelvis was performed following the standard protocol during bolus administration of intravenous contrast.  CONTRAST:  135m OMNIPAQUE IOHEXOL 300 MG/ML  SOLN  COMPARISON:  Multiple prior chest CTs and PET-CT 08/26/2013  FINDINGS: CT CHEST FINDINGS  Chest wall: Stable right-sided Port-A-Cath. No breast masses, supraclavicular or axillary lymphadenopathy. The thyroid gland is grossly normal. The bony thorax is intact. No destructive bone lesions or spinal canal compromise.  Mediastinum: The heart is normal in size. No pericardial effusion. No mediastinal or hilar mass or lymphadenopathy. The aorta demonstrates stable atherosclerotic calcifications. No dissection or aneurysm. The esophagus is grossly normal.  There is a small hiatal hernia.  Lungs: Stable biapical parenchymal scarring changes. Stable emphysema. The right lower lobe spiculated lung mass measures 14 x 12 mm and is stable. Probable surrounding radiation changes. No other pulmonary lesions.  CT ABDOMEN AND PELVIS FINDINGS  Numerous hepatic lesions continue to decrease in size. The segment 7 lesion measures a maximum of 2.2 cm and previously measured 2.9 cm. The segment 8 lesion measures a maximum of 2.4 cm and previously measured 2.7 cm. Segment 2 lesion measures a maximum of 14 mm and previously measured 18 mm. No new lesions. The pancreas, spleen, adrenal glands and kidneys are unremarkable and stable.  The stomach is moderately distended with contrast, fluid and air. The small bowel and colon are grossly normal. Moderate stool throughout the colon may suggest constipation. No inflammatory changes, mass lesions or obstructive findings.  No mesenteric or retroperitoneal mass or adenopathy. Stable advanced atherosclerotic calcifications involving the aorta and branch vessels.  Stable large exophytic fibroid in the right pelvis. The ovaries appear normal. No pelvic mass or adenopathy. No inguinal mass or adenopathy.  IMPRESSION: 1. Stable right lower lobe pulmonary nodule with surrounding probable radiation changes. 2. No mediastinal or hilar lymphadenopathy. 3. Continued slight improvement in hepatic metastatic disease. 4. No abdominal/pelvic lymphadenopathy. Stable large exophytic fibroid in the right pelvis.   Electronically Signed   By: MKalman JewelsM.D.   On: 07/27/2014 16:42   Ct Abdomen Pelvis W Contrast  07/27/2014   CLINICAL DATA:  Lung cancer diagnosed in 2014. Chemotherapy and progress.  EXAM: CT CHEST, ABDOMEN, AND PELVIS WITH CONTRAST  TECHNIQUE: Multidetector CT imaging of the chest, abdomen and pelvis was performed following the standard protocol during bolus administration of intravenous contrast.  CONTRAST:  1020mOMNIPAQUE IOHEXOL 300  MG/ML  SOLN  COMPARISON:  Multiple prior chest CTs and PET-CT 08/26/2013  FINDINGS: CT CHEST FINDINGS  Chest wall: Stable right-sided Port-A-Cath. No breast masses, supraclavicular or axillary lymphadenopathy. The thyroid gland is grossly normal. The bony thorax is intact. No destructive bone lesions or spinal canal compromise.  Mediastinum: The heart is normal in size. No pericardial effusion. No mediastinal or hilar mass or lymphadenopathy. The aorta demonstrates stable atherosclerotic calcifications. No dissection or aneurysm. The esophagus is grossly normal. There is a small hiatal hernia.  Lungs: Stable biapical parenchymal scarring changes. Stable emphysema. The right lower lobe spiculated lung mass measures 14 x 12 mm and is stable. Probable surrounding radiation changes. No other pulmonary lesions.  CT ABDOMEN AND PELVIS FINDINGS  Numerous hepatic lesions continue to decrease in size. The segment 7 lesion measures a maximum of 2.2 cm and previously measured 2.9 cm. The segment 8 lesion measures a maximum of 2.4 cm and previously measured 2.7 cm. Segment 2 lesion measures a maximum of 14 mm and previously measured 18 mm. No new lesions. The pancreas, spleen, adrenal glands and kidneys are unremarkable and stable.  The stomach is moderately distended with contrast, fluid and air. The small  bowel and colon are grossly normal. Moderate stool throughout the colon may suggest constipation. No inflammatory changes, mass lesions or obstructive findings.  No mesenteric or retroperitoneal mass or adenopathy. Stable advanced atherosclerotic calcifications involving the aorta and branch vessels.  Stable large exophytic fibroid in the right pelvis. The ovaries appear normal. No pelvic mass or adenopathy. No inguinal mass or adenopathy.  IMPRESSION: 1. Stable right lower lobe pulmonary nodule with surrounding probable radiation changes. 2. No mediastinal or hilar lymphadenopathy. 3. Continued slight improvement in hepatic  metastatic disease. 4. No abdominal/pelvic lymphadenopathy. Stable large exophytic fibroid in the right pelvis.   Electronically Signed   By: Amber Hayden M.D.   On: 07/27/2014 16:42    ASSESSMENT AND PLAN: This is a very pleasant 63 years old white female recently diagnosed with metastatic non-small cell lung cancer, adenocarcinoma presented with right lower lobe lung mass in addition to mediastinal and supraclavicular lymphadenopathy as well as diffuse metastatic liver lesions and retroperitoneal lymphadenopathy. She underwent 3 cycles of systemic chemotherapy with carboplatin and Alimta but unfortunately has evidence for disease progression especially in the liver and new small left adrenal metastasis. She is currently undergoing second line chemotherapy with cisplatin and gemcitabine status post 12 cycles and tolerating it fairly well patient after reducing the dose of cisplatin. This treatment was discontinued after the patient developed hypersensitivity reaction to cisplatin. The recent CT scan of the chest, abdomen and pelvis showed no evidence for disease progression. She is being treated with systemic chemotherapy with single agent gemcitabine 1000 MG/M2 on days 1 and 8 every 3 weeks. Status post 1 cycle. Patient was discussed with and also seen by Dr. Julien Nordmann. She will proceed with cycle #2 of her systemic chemotherapy with single agent gemcitabine today as scheduled. She will continue with labs on treatment days as previously scheduled. She'll follow-up in 3 weeks prior to the start of cycle #3. For the peripheral neuropathy, she will continue on Cymbalta 30 mg by mouth each bedtime. Refill prescriptions for her Cymbalta and Compazine, 90 day supply with 1 refill each was sent her pharmacy of record via E scribe.   She was advised to call immediately if she has any concerning symptoms in the interval. The patient voices understanding of current disease status and treatment options and is in  agreement with the current care plan.  All questions were answered. The patient knows to call the clinic with any problems, questions or concerns. We can certainly see the patient much sooner if necessary.  Carlton Adam, PA-C 08/19/2014  ADDENDUM: Hematology/Oncology Attending: I had a face to face encounter with the patient. I recommended her care plan. This is a very pleasant 63 years old white female with a stage IV non-small cell lung cancer, adenocarcinoma currently undergoing systemic chemotherapy with single agent gemcitabine status post 1 cycle. She is tolerating her treatment well with no significant adverse effects. I recommended for the patient to proceed with cycle #2 today as scheduled. She will come back for follow-up visit in 3 weeks with the start of cycle #3. For the peripheral neuropathy, she will continue on Cymbalta 30 mg by mouth daily at bedtime. The patient was advised to call immediately if she has any concerning symptoms in the interval.  Disclaimer: This note was dictated with voice recognition software. Similar sounding words can inadvertently be transcribed and may not be corrected upon review. Eilleen Kempf., MD 08/21/2014

## 2014-08-25 ENCOUNTER — Other Ambulatory Visit: Payer: Self-pay | Admitting: *Deleted

## 2014-08-26 ENCOUNTER — Ambulatory Visit (HOSPITAL_BASED_OUTPATIENT_CLINIC_OR_DEPARTMENT_OTHER): Payer: 59

## 2014-08-26 ENCOUNTER — Ambulatory Visit: Payer: 59

## 2014-08-26 ENCOUNTER — Other Ambulatory Visit (HOSPITAL_BASED_OUTPATIENT_CLINIC_OR_DEPARTMENT_OTHER): Payer: 59

## 2014-08-26 DIAGNOSIS — Z95828 Presence of other vascular implants and grafts: Secondary | ICD-10-CM

## 2014-08-26 DIAGNOSIS — C3431 Malignant neoplasm of lower lobe, right bronchus or lung: Secondary | ICD-10-CM

## 2014-08-26 DIAGNOSIS — C221 Intrahepatic bile duct carcinoma: Secondary | ICD-10-CM

## 2014-08-26 DIAGNOSIS — C787 Secondary malignant neoplasm of liver and intrahepatic bile duct: Secondary | ICD-10-CM

## 2014-08-26 DIAGNOSIS — C3491 Malignant neoplasm of unspecified part of right bronchus or lung: Secondary | ICD-10-CM

## 2014-08-26 DIAGNOSIS — Z5111 Encounter for antineoplastic chemotherapy: Secondary | ICD-10-CM

## 2014-08-26 LAB — COMPREHENSIVE METABOLIC PANEL (CC13)
ALT: 44 U/L (ref 0–55)
AST: 39 U/L — AB (ref 5–34)
Albumin: 3.4 g/dL — ABNORMAL LOW (ref 3.5–5.0)
Alkaline Phosphatase: 151 U/L — ABNORMAL HIGH (ref 40–150)
Anion Gap: 10 mEq/L (ref 3–11)
BUN: 21.8 mg/dL (ref 7.0–26.0)
CHLORIDE: 102 meq/L (ref 98–109)
CO2: 26 mEq/L (ref 22–29)
CREATININE: 0.9 mg/dL (ref 0.6–1.1)
Calcium: 9.4 mg/dL (ref 8.4–10.4)
EGFR: 64 mL/min/{1.73_m2} — ABNORMAL LOW (ref >90)
Glucose: 103 mg/dl (ref 70–140)
Potassium: 4.2 mEq/L (ref 3.5–5.1)
Sodium: 137 mEq/L (ref 136–145)
Total Bilirubin: 0.38 mg/dL (ref 0.20–1.20)
Total Protein: 6.4 g/dL (ref 6.4–8.3)

## 2014-08-26 LAB — CBC WITH DIFFERENTIAL/PLATELET
BASO%: 0.7 % (ref 0.0–2.0)
Basophils Absolute: 0 10*3/uL (ref 0.0–0.1)
EOS%: 0.2 % (ref 0.0–7.0)
Eosinophils Absolute: 0 10*3/uL (ref 0.0–0.5)
HEMATOCRIT: 25.5 % — AB (ref 34.8–46.6)
HEMOGLOBIN: 8.3 g/dL — AB (ref 11.6–15.9)
LYMPH%: 36.5 % (ref 14.0–49.7)
MCH: 32.2 pg (ref 25.1–34.0)
MCHC: 32.5 g/dL (ref 31.5–36.0)
MCV: 98.9 fL (ref 79.5–101.0)
MONO#: 0.4 10*3/uL (ref 0.1–0.9)
MONO%: 13.1 % (ref 0.0–14.0)
NEUT%: 49.5 % (ref 38.4–76.8)
NEUTROS ABS: 1.4 10*3/uL — AB (ref 1.5–6.5)
PLATELETS: 171 10*3/uL (ref 145–400)
RBC: 2.58 10*6/uL — AB (ref 3.70–5.45)
RDW: 21 % — ABNORMAL HIGH (ref 11.2–14.5)
WBC: 2.8 10*3/uL — ABNORMAL LOW (ref 3.9–10.3)
lymph#: 1 10*3/uL (ref 0.9–3.3)

## 2014-08-26 MED ORDER — SODIUM CHLORIDE 0.9 % IV SOLN
Freq: Once | INTRAVENOUS | Status: AC
Start: 2014-08-26 — End: 2014-08-26
  Administered 2014-08-26: 10:00:00 via INTRAVENOUS

## 2014-08-26 MED ORDER — SODIUM CHLORIDE 0.9 % IV SOLN
1000.0000 mg/m2 | Freq: Once | INTRAVENOUS | Status: AC
  Administered 2014-08-26: 1482 mg via INTRAVENOUS
  Filled 2014-08-26: qty 38.98

## 2014-08-26 MED ORDER — SODIUM CHLORIDE 0.9 % IJ SOLN
10.0000 mL | INTRAMUSCULAR | Status: DC | PRN
  Administered 2014-08-26: 10 mL
  Filled 2014-08-26: qty 10

## 2014-08-26 MED ORDER — SODIUM CHLORIDE 0.9 % IJ SOLN
10.0000 mL | INTRAMUSCULAR | Status: DC | PRN
  Administered 2014-08-26: 10 mL via INTRAVENOUS
  Filled 2014-08-26: qty 10

## 2014-08-26 MED ORDER — PROCHLORPERAZINE MALEATE 10 MG PO TABS
ORAL_TABLET | ORAL | Status: AC
  Filled 2014-08-26: qty 1

## 2014-08-26 MED ORDER — PROCHLORPERAZINE MALEATE 10 MG PO TABS
10.0000 mg | ORAL_TABLET | Freq: Once | ORAL | Status: AC
  Administered 2014-08-26: 10 mg via ORAL

## 2014-08-26 MED ORDER — HEPARIN SOD (PORK) LOCK FLUSH 100 UNIT/ML IV SOLN
500.0000 [IU] | Freq: Once | INTRAVENOUS | Status: AC | PRN
  Administered 2014-08-26: 500 [IU]
  Filled 2014-08-26: qty 5

## 2014-08-26 NOTE — Progress Notes (Signed)
Verbal consent given by Dr. Earlie Server to treat patient with  ABS Neut count of 1.4.

## 2014-08-26 NOTE — Patient Instructions (Signed)

## 2014-08-26 NOTE — Patient Instructions (Signed)
[facility] Discharge Instructions for Patients Receiving Chemotherapy  Today you received the following chemotherapy agents gemzar  To help prevent nausea and vomiting after your treatment, we encourage you to take your nausea medication as directed.  If you develop nausea and vomiting that is not controlled by your nausea medication, call the clinic.   BELOW ARE SYMPTOMS THAT SHOULD BE REPORTED IMMEDIATELY:  *FEVER GREATER THAN 100.5 F  *CHILLS WITH OR WITHOUT FEVER  NAUSEA AND VOMITING THAT IS NOT CONTROLLED WITH YOUR NAUSEA MEDICATION  *UNUSUAL SHORTNESS OF BREATH  *UNUSUAL BRUISING OR BLEEDING  TENDERNESS IN MOUTH AND THROAT WITH OR WITHOUT PRESENCE OF ULCERS  *URINARY PROBLEMS  *BOWEL PROBLEMS  UNUSUAL RASH Items with * indicate a potential emergency and should be followed up as soon as possible.  Feel free to call the clinic you have any questions or concerns. The clinic phone number is [phone].  

## 2014-09-09 ENCOUNTER — Telehealth: Payer: Self-pay | Admitting: Internal Medicine

## 2014-09-09 ENCOUNTER — Encounter: Payer: Self-pay | Admitting: Physician Assistant

## 2014-09-09 ENCOUNTER — Ambulatory Visit: Payer: 59

## 2014-09-09 ENCOUNTER — Ambulatory Visit (HOSPITAL_BASED_OUTPATIENT_CLINIC_OR_DEPARTMENT_OTHER): Payer: 59 | Admitting: Physician Assistant

## 2014-09-09 ENCOUNTER — Ambulatory Visit (HOSPITAL_BASED_OUTPATIENT_CLINIC_OR_DEPARTMENT_OTHER): Payer: 59

## 2014-09-09 ENCOUNTER — Other Ambulatory Visit (HOSPITAL_BASED_OUTPATIENT_CLINIC_OR_DEPARTMENT_OTHER): Payer: 59

## 2014-09-09 VITALS — BP 125/63 | HR 72 | Temp 98.1°F | Resp 18 | Ht 62.0 in | Wt 115.5 lb

## 2014-09-09 DIAGNOSIS — C3490 Malignant neoplasm of unspecified part of unspecified bronchus or lung: Secondary | ICD-10-CM

## 2014-09-09 DIAGNOSIS — C787 Secondary malignant neoplasm of liver and intrahepatic bile duct: Secondary | ICD-10-CM

## 2014-09-09 DIAGNOSIS — C3491 Malignant neoplasm of unspecified part of right bronchus or lung: Secondary | ICD-10-CM

## 2014-09-09 DIAGNOSIS — C3431 Malignant neoplasm of lower lobe, right bronchus or lung: Secondary | ICD-10-CM

## 2014-09-09 DIAGNOSIS — Z5111 Encounter for antineoplastic chemotherapy: Secondary | ICD-10-CM

## 2014-09-09 DIAGNOSIS — Z95828 Presence of other vascular implants and grafts: Secondary | ICD-10-CM

## 2014-09-09 LAB — COMPREHENSIVE METABOLIC PANEL (CC13)
ALK PHOS: 171 U/L — AB (ref 40–150)
ALT: 23 U/L (ref 0–55)
AST: 30 U/L (ref 5–34)
Albumin: 3.4 g/dL — ABNORMAL LOW (ref 3.5–5.0)
Anion Gap: 9 mEq/L (ref 3–11)
BILIRUBIN TOTAL: 0.36 mg/dL (ref 0.20–1.20)
BUN: 13.8 mg/dL (ref 7.0–26.0)
CHLORIDE: 103 meq/L (ref 98–109)
CO2: 25 mEq/L (ref 22–29)
CREATININE: 1.1 mg/dL (ref 0.6–1.1)
Calcium: 9.1 mg/dL (ref 8.4–10.4)
EGFR: 55 mL/min/{1.73_m2} — AB (ref >90)
GLUCOSE: 98 mg/dL (ref 70–140)
Potassium: 4.2 mEq/L (ref 3.5–5.1)
SODIUM: 137 meq/L (ref 136–145)
TOTAL PROTEIN: 6.6 g/dL (ref 6.4–8.3)

## 2014-09-09 LAB — CBC WITH DIFFERENTIAL/PLATELET
BASO%: 0.7 % (ref 0.0–2.0)
Basophils Absolute: 0 10*3/uL (ref 0.0–0.1)
EOS ABS: 0 10*3/uL (ref 0.0–0.5)
EOS%: 0.4 % (ref 0.0–7.0)
HCT: 26.8 % — ABNORMAL LOW (ref 34.8–46.6)
HGB: 8.8 g/dL — ABNORMAL LOW (ref 11.6–15.9)
LYMPH#: 1.2 10*3/uL (ref 0.9–3.3)
LYMPH%: 22.7 % (ref 14.0–49.7)
MCH: 33.1 pg (ref 25.1–34.0)
MCHC: 32.8 g/dL (ref 31.5–36.0)
MCV: 101 fL (ref 79.5–101.0)
MONO#: 0.6 10*3/uL (ref 0.1–0.9)
MONO%: 12.2 % (ref 0.0–14.0)
NEUT%: 64 % (ref 38.4–76.8)
NEUTROS ABS: 3.3 10*3/uL (ref 1.5–6.5)
PLATELETS: 180 10*3/uL (ref 145–400)
RBC: 2.66 10*6/uL — ABNORMAL LOW (ref 3.70–5.45)
RDW: 21.5 % — ABNORMAL HIGH (ref 11.2–14.5)
WBC: 5.1 10*3/uL (ref 3.9–10.3)

## 2014-09-09 MED ORDER — PROCHLORPERAZINE MALEATE 10 MG PO TABS
10.0000 mg | ORAL_TABLET | Freq: Once | ORAL | Status: AC
  Administered 2014-09-09: 10 mg via ORAL

## 2014-09-09 MED ORDER — HEPARIN SOD (PORK) LOCK FLUSH 100 UNIT/ML IV SOLN
500.0000 [IU] | Freq: Once | INTRAVENOUS | Status: AC | PRN
  Administered 2014-09-09: 500 [IU]
  Filled 2014-09-09: qty 5

## 2014-09-09 MED ORDER — PROCHLORPERAZINE MALEATE 10 MG PO TABS
ORAL_TABLET | ORAL | Status: AC
  Filled 2014-09-09: qty 1

## 2014-09-09 MED ORDER — SODIUM CHLORIDE 0.9 % IJ SOLN
10.0000 mL | INTRAMUSCULAR | Status: DC | PRN
  Administered 2014-09-09: 10 mL
  Filled 2014-09-09: qty 10

## 2014-09-09 MED ORDER — SODIUM CHLORIDE 0.9 % IV SOLN
1000.0000 mg/m2 | Freq: Once | INTRAVENOUS | Status: AC
  Administered 2014-09-09: 1482 mg via INTRAVENOUS
  Filled 2014-09-09: qty 38.98

## 2014-09-09 MED ORDER — SODIUM CHLORIDE 0.9 % IV SOLN
Freq: Once | INTRAVENOUS | Status: AC
  Administered 2014-09-09: 10:00:00 via INTRAVENOUS

## 2014-09-09 MED ORDER — SODIUM CHLORIDE 0.9 % IJ SOLN
10.0000 mL | INTRAMUSCULAR | Status: DC | PRN
  Administered 2014-09-09: 10 mL via INTRAVENOUS
  Filled 2014-09-09: qty 10

## 2014-09-09 NOTE — Telephone Encounter (Signed)
Pt confirmed labs/ov per 01/14 POF, gave pt AVS.... KJ, sent msg to add chemo

## 2014-09-09 NOTE — Patient Instructions (Signed)
Continue labs and chemotherapy as scheduled Follow-up in 3 weeks with a restaging CT scan of your chest, abdomen and pelvis to reevaluate your disease

## 2014-09-09 NOTE — Patient Instructions (Signed)

## 2014-09-09 NOTE — Patient Instructions (Signed)
[facility] Discharge Instructions for Patients Receiving Chemotherapy  Today you received the following chemotherapy agents Gemzar To help prevent nausea and vomiting after your treatment, we encourage you to take your nausea medication as prescribed.  If you develop nausea and vomiting that is not controlled by your nausea medication, call the clinic.   BELOW ARE SYMPTOMS THAT SHOULD BE REPORTED IMMEDIATELY:  *FEVER GREATER THAN 100.5 F  *CHILLS WITH OR WITHOUT FEVER  NAUSEA AND VOMITING THAT IS NOT CONTROLLED WITH YOUR NAUSEA MEDICATION  *UNUSUAL SHORTNESS OF BREATH  *UNUSUAL BRUISING OR BLEEDING  TENDERNESS IN MOUTH AND THROAT WITH OR WITHOUT PRESENCE OF ULCERS  *URINARY PROBLEMS  *BOWEL PROBLEMS  UNUSUAL RASH Items with * indicate a potential emergency and should be followed up as soon as possible.  Feel free to call the clinic you have any questions or concerns. The clinic phone number is [phone].

## 2014-09-09 NOTE — Progress Notes (Addendum)
[place] Telephone:([phone]) [phone]   [phone]) [phone]  OFFICE PROGRESS NOTE  Mathews Argyle, MD [address] [place] [address]  DIAGNOSIS: Stage IV (T1b, N2, M1b) non-small cell lung cancer, adenocarcinoma, presented with a right lower lobe lung mass as well as mediastinal, hilar, bilateral supraclavicular lymphadenopathy in addition to extensive liver metastases and retroperitoneal lymphadenopathy diagnosed in December of 2014.  MOLECULAR BIOMARKERS: [facility] biomarker testing revealed TP53 E204*, KEAP1 R637f*25+, NFKBIA amplification, NKX2-1 amplification, SMARCA4 E463*. The patient was negative for EGFR and ALK gene translocation   PRIOR THERAPY:  1) Systemic chemotherapy with carboplatin for AUC of 5 and Alimta 500 mg/M2 every 3 weeks, status post 3 cycles. First dose 09/02/2013.  2)  Systemic chemotherapy with cisplatin 60 mg/M2 on days 1 and gemcitabine 800 mg/M2 on days 1 and 8 every 3 weeks, first cycle on 11/12/2013. Status post 12 cycles. Discontinued secondary to hypersensitivity reaction to cisplatin during cycle #12.  CURRENT THERAPY: Systemic chemotherapy with gemcitabine 1000 mg/M2 on days 1 and 8 every 3 weeks, first cycle on 07/29/2014. Status post 2 cycles   CHEMOTHERAPY INTENT: palliative  CURRENT # OF CHEMOTHERAPY CYCLES: 3  CURRENT ANTIEMETICS: Zofran, dexamethasone and Compazine.  CURRENT SMOKING STATUS: former smoker  ORAL CHEMOTHERAPY AND CONSENT: None  CURRENT BISPHOSPHONATES USE: None  PAIN MANAGEMENT: 0/10  NARCOTICS INDUCED CONSTIPATION: None  LIVING WILL AND CODE STATUS: Full Code  INTERVAL HISTORY: Amber LAHMANN63y.o. female returns to the clinic today for follow up visit accompanied by her husband. The patient is feeling fine today with no specific complaints except for fatigue and and the persistent mild peripheral neuropathy. She is now taking Cymbalta at nighttime and tolerating it well.  She noticed mild improvement in her neuropathy in her fingertips, particularly the fourth and fifth fingers of both hands. She still has some significant peripheral neuropathy affecting her feet which continues to cause some imbalance issues. She has not sustained any recent falls. The patient had hypersensitivity reaction to cisplatin during cycle #12 and this was discontinued and she received treatment with gemcitabine only as a single agent.  The patient denied having any significant nausea or vomiting. The nausea that she does have is well-controlled with Compazine, average of 2 tablets daily. She denied having any fever or chills. She has no significant weight loss or night sweats. She denied having any significant chest pain, shortness of breath, cough or hemoptysis.   MEDICAL HISTORY: Past Medical History  Diagnosis Date  . HTN (hypertension)   . lung ca dx'd 07/2013    ALLERGIES:  is allergic to cisplatin; hctz; and plendil.  MEDICATIONS:  Current Outpatient Prescriptions  Medication Sig Dispense Refill  . amLODipine (NORVASC) 10 MG tablet Take 10 mg by mouth every morning.     .Marland Kitchenaspirin EC 81 MG tablet Take 81 mg by mouth every morning.     . calcium carbonate (TUMS - DOSED IN MG ELEMENTAL CALCIUM) 500 MG chewable tablet Chew 1 tablet by mouth 3 (three) times daily as needed for indigestion or heartburn.     . DULoxetine (CYMBALTA) 30 MG capsule Take 1 capsule (30 mg total) by mouth daily. 90 capsule 1  . lidocaine-prilocaine (EMLA) cream Apply 1 application topically as needed. Apply to port 1 hour before chemo appointment 30 g 0  . lisinopril (PRINIVIL,ZESTRIL) 20 MG tablet Take 20 mg by mouth every morning.     . magnesium oxide (MAG-OX) 400 (  241.3 MG) MG tablet Take 1 tablet (400 mg total) by mouth 2 (two) times daily. 60 tablet 1  . nebivolol (BYSTOLIC) 10 MG tablet Take 10 mg by mouth every morning.     Marland Kitchen omeprazole (PRILOSEC) 40 MG capsule Take 40 mg by mouth daily.    .  prochlorperazine (COMPAZINE) 10 MG tablet Take 1 tablet (10 mg total) by mouth every 6 (six) hours as needed for nausea or vomiting. 90 tablet 1  . oxyCODONE (ROXICODONE) 5 MG immediate release tablet Take 1 tablet (5 mg total) by mouth every 6 (six) hours as needed for severe pain. (Patient not taking: Reported on 09/09/2014) 60 tablet 0   No current facility-administered medications for this visit.   Facility-Administered Medications Ordered in Other Visits  Medication Dose Route Frequency Provider Last Rate Last Dose  . sodium chloride 0.9 % injection 10 mL  10 mL Intracatheter PRN Curt Bears, MD   10 mL at 09/09/14 1041    REVIEW OF SYSTEMS:  Constitutional: positive for fatigue Eyes: negative Ears, nose, mouth, throat, and face: negative Respiratory: negative Cardiovascular: negative Gastrointestinal: positive for nausea Genitourinary:negative Integument/breast: negative Hematologic/lymphatic: negative Musculoskeletal:negative Neurological: positive for gait problems and paresthesia Behavioral/Psych: negative Endocrine: negative Allergic/Immunologic: negative   PHYSICAL EXAMINATION: General appearance: alert, cooperative and no distress Head: Normocephalic, without obvious abnormality, atraumatic Neck: no adenopathy, no JVD, supple, symmetrical, trachea midline and thyroid not enlarged, symmetric, no tenderness/mass/nodules Lymph nodes: Cervical, supraclavicular, and axillary nodes normal. Resp: clear to auscultation bilaterally Back: symmetric, no curvature. ROM normal. No CVA tenderness. Cardio: regular rate and rhythm, S1, S2 normal, no murmur, click, rub or gallop GI: soft, non-tender; bowel sounds normal; no masses,  no organomegaly Extremities: extremities normal, atraumatic, no cyanosis or edema Neurologic: Grossly normal  ECOG PERFORMANCE STATUS: 1 - Symptomatic but completely ambulatory  Blood pressure 125/63, pulse 72, temperature 98.1 F (36.7 C),  temperature source Oral, resp. rate 18, height 5' 2"  (1.575 m), weight 115 lb 8 oz (52.39 kg), SpO2 100 %.  LABORATORY DATA: Lab Results  Component Value Date   WBC 5.1 09/09/2014   HGB 8.8* 09/09/2014   HCT 26.8* 09/09/2014   MCV 101.0 09/09/2014   PLT 180 09/09/2014      Chemistry      Component Value Date/Time   NA 137 09/09/2014 0815   NA 135 05/13/2014 1007   K 4.2 09/09/2014 0815   K 4.5 05/13/2014 1007   CL 96 05/13/2014 1007   CO2 25 09/09/2014 0815   CO2 29 05/13/2014 1007   BUN 13.8 09/09/2014 0815   BUN 18 05/13/2014 1007   CREATININE 1.1 09/09/2014 0815   CREATININE 1.00 05/13/2014 1007      Component Value Date/Time   CALCIUM 9.1 09/09/2014 0815   CALCIUM 9.5 05/13/2014 1007       RADIOGRAPHIC STUDIES: No results found.  ASSESSMENT AND PLAN: This is a very pleasant 64 years old white female recently diagnosed with metastatic non-small cell lung cancer, adenocarcinoma presented with right lower lobe lung mass in addition to mediastinal and supraclavicular lymphadenopathy as well as diffuse metastatic liver lesions and retroperitoneal lymphadenopathy. She underwent 3 cycles of systemic chemotherapy with carboplatin and Alimta but unfortunately has evidence for disease progression especially in the liver and new small left adrenal metastasis. She is currently undergoing second line chemotherapy with cisplatin and gemcitabine status post 12 cycles and tolerating it fairly well patient after reducing the dose of cisplatin. This treatment was discontinued after the  patient developed hypersensitivity reaction to cisplatin. The recent CT scan of the chest, abdomen and pelvis showed no evidence for disease progression. She is being treated with systemic chemotherapy with single agent gemcitabine 1000 MG/M2 on days 1 and 8 every 3 weeks. Status post 2 cycles. Patient was discussed with and also seen by Dr. Julien Nordmann. She will proceed with cycle #3 of her systemic  chemotherapy with single agent gemcitabine today as scheduled. She will continue with labs on treatment days as previously scheduled. She'll follow-up in 3 weeks prior to the start of cycle #4 with a restaging CT scan of her chest, abdomen and pelvis with contrast to reevaluate her disease. For the peripheral neuropathy, she will continue on Cymbalta 30 mg by mouth each bedtime.   She was advised to call immediately if she has any concerning symptoms in the interval. The patient voices understanding of current disease status and treatment options and is in agreement with the current care plan.  All questions were answered. The patient knows to call the clinic with any problems, questions or concerns. We can certainly see the patient much sooner if necessary.  Carlton Adam, PA-C 09/09/2014  ADDENDUM: Hematology/Oncology Attending: I had a face to face encounter with the patient today. I recommended her care plan. This is a very pleasant 64 years old white female with a stage IV non-small cell lung cancer, adenocarcinoma currently undergoing treatment with single agent gemcitabine status post 2 cycles. She is tolerating her treatment fairly well was no significant complaints except for mild fatigue. I recommended for her to proceed with cycle #3 today as scheduled. The patient would come back for follow-up visit in 3 weeks for reevaluation after repeating CT scan of the chest, abdomen and pelvis for restaging of her disease. She was advised to call immediately if she has any concerning symptoms in the interval.  Disclaimer: This note was dictated with voice recognition software. Similar sounding words can inadvertently be transcribed and may be missed upon review. Eilleen Kempf., MD 09/09/2014

## 2014-09-10 ENCOUNTER — Telehealth: Payer: Self-pay | Admitting: *Deleted

## 2014-09-10 NOTE — Telephone Encounter (Signed)
Per staff message and POF I have scheduled appts. Advised scheduler of appts. JMW  

## 2014-09-16 ENCOUNTER — Other Ambulatory Visit (HOSPITAL_BASED_OUTPATIENT_CLINIC_OR_DEPARTMENT_OTHER): Payer: 59

## 2014-09-16 ENCOUNTER — Ambulatory Visit: Payer: 59

## 2014-09-16 ENCOUNTER — Ambulatory Visit (HOSPITAL_BASED_OUTPATIENT_CLINIC_OR_DEPARTMENT_OTHER): Payer: 59

## 2014-09-16 DIAGNOSIS — C787 Secondary malignant neoplasm of liver and intrahepatic bile duct: Secondary | ICD-10-CM

## 2014-09-16 DIAGNOSIS — Z95828 Presence of other vascular implants and grafts: Secondary | ICD-10-CM

## 2014-09-16 DIAGNOSIS — Z5111 Encounter for antineoplastic chemotherapy: Secondary | ICD-10-CM

## 2014-09-16 DIAGNOSIS — C3431 Malignant neoplasm of lower lobe, right bronchus or lung: Secondary | ICD-10-CM

## 2014-09-16 DIAGNOSIS — C3491 Malignant neoplasm of unspecified part of right bronchus or lung: Secondary | ICD-10-CM

## 2014-09-16 LAB — CBC WITH DIFFERENTIAL/PLATELET
BASO%: 0.5 % (ref 0.0–2.0)
Basophils Absolute: 0 10*3/uL (ref 0.0–0.1)
EOS%: 0.1 % (ref 0.0–7.0)
Eosinophils Absolute: 0 10*3/uL (ref 0.0–0.5)
HCT: 26.1 % — ABNORMAL LOW (ref 34.8–46.6)
HGB: 8.5 g/dL — ABNORMAL LOW (ref 11.6–15.9)
LYMPH%: 34.7 % (ref 14.0–49.7)
MCH: 33.5 pg (ref 25.1–34.0)
MCHC: 32.7 g/dL (ref 31.5–36.0)
MCV: 102.3 fL — AB (ref 79.5–101.0)
MONO#: 0.4 10*3/uL (ref 0.1–0.9)
MONO%: 12.3 % (ref 0.0–14.0)
NEUT#: 1.7 10*3/uL (ref 1.5–6.5)
NEUT%: 52.4 % (ref 38.4–76.8)
PLATELETS: 200 10*3/uL (ref 145–400)
RBC: 2.55 10*6/uL — AB (ref 3.70–5.45)
RDW: 19.6 % — ABNORMAL HIGH (ref 11.2–14.5)
WBC: 3.3 10*3/uL — AB (ref 3.9–10.3)
lymph#: 1.1 10*3/uL (ref 0.9–3.3)

## 2014-09-16 LAB — COMPREHENSIVE METABOLIC PANEL (CC13)
ALK PHOS: 166 U/L — AB (ref 40–150)
ALT: 28 U/L (ref 0–55)
ANION GAP: 10 meq/L (ref 3–11)
AST: 35 U/L — AB (ref 5–34)
Albumin: 3.4 g/dL — ABNORMAL LOW (ref 3.5–5.0)
BILIRUBIN TOTAL: 0.34 mg/dL (ref 0.20–1.20)
BUN: 14.8 mg/dL (ref 7.0–26.0)
CHLORIDE: 102 meq/L (ref 98–109)
CO2: 25 mEq/L (ref 22–29)
CREATININE: 1.2 mg/dL — AB (ref 0.6–1.1)
Calcium: 9.2 mg/dL (ref 8.4–10.4)
EGFR: 50 mL/min/{1.73_m2} — ABNORMAL LOW (ref >90)
Glucose: 141 mg/dl — ABNORMAL HIGH (ref 70–140)
POTASSIUM: 4.3 meq/L (ref 3.5–5.1)
SODIUM: 137 meq/L (ref 136–145)
TOTAL PROTEIN: 6.6 g/dL (ref 6.4–8.3)

## 2014-09-16 MED ORDER — SODIUM CHLORIDE 0.9 % IV SOLN
1000.0000 mg/m2 | Freq: Once | INTRAVENOUS | Status: AC
  Administered 2014-09-16: 1482 mg via INTRAVENOUS
  Filled 2014-09-16: qty 38.98

## 2014-09-16 MED ORDER — SODIUM CHLORIDE 0.9 % IJ SOLN
10.0000 mL | INTRAMUSCULAR | Status: DC | PRN
  Administered 2014-09-16: 10 mL via INTRAVENOUS
  Filled 2014-09-16: qty 10

## 2014-09-16 MED ORDER — SODIUM CHLORIDE 0.9 % IV SOLN
Freq: Once | INTRAVENOUS | Status: AC
  Administered 2014-09-16: 10:00:00 via INTRAVENOUS

## 2014-09-16 MED ORDER — PROCHLORPERAZINE MALEATE 10 MG PO TABS
10.0000 mg | ORAL_TABLET | Freq: Once | ORAL | Status: AC
  Administered 2014-09-16: 10 mg via ORAL

## 2014-09-16 MED ORDER — HEPARIN SOD (PORK) LOCK FLUSH 100 UNIT/ML IV SOLN
500.0000 [IU] | Freq: Once | INTRAVENOUS | Status: AC | PRN
  Administered 2014-09-16: 500 [IU]
  Filled 2014-09-16: qty 5

## 2014-09-16 MED ORDER — SODIUM CHLORIDE 0.9 % IJ SOLN
10.0000 mL | INTRAMUSCULAR | Status: DC | PRN
  Filled 2014-09-16: qty 10

## 2014-09-16 MED ORDER — HEPARIN SOD (PORK) LOCK FLUSH 100 UNIT/ML IV SOLN
500.0000 [IU] | Freq: Once | INTRAVENOUS | Status: DC
  Filled 2014-09-16: qty 5

## 2014-09-16 MED ORDER — SODIUM CHLORIDE 0.9 % IJ SOLN
10.0000 mL | INTRAMUSCULAR | Status: DC | PRN
  Administered 2014-09-16: 10 mL
  Filled 2014-09-16: qty 10

## 2014-09-16 MED ORDER — PROCHLORPERAZINE MALEATE 10 MG PO TABS
ORAL_TABLET | ORAL | Status: AC
Start: 2014-09-16 — End: 2014-09-16
  Filled 2014-09-16: qty 1

## 2014-09-16 NOTE — Patient Instructions (Addendum)
[facility] Discharge Instructions for Patients Receiving Chemotherapy  Today you received the following chemotherapy agents:  Gemzar.  To help prevent nausea and vomiting after your treatment, we encourage you to take your nausea medication: compazine 10 mg every 6 hours.   If you develop nausea and vomiting that is not controlled by your nausea medication, call the clinic.   BELOW ARE SYMPTOMS THAT SHOULD BE REPORTED IMMEDIATELY:  *FEVER GREATER THAN 100.5 F  *CHILLS WITH OR WITHOUT FEVER  NAUSEA AND VOMITING THAT IS NOT CONTROLLED WITH YOUR NAUSEA MEDICATION  *UNUSUAL SHORTNESS OF BREATH  *UNUSUAL BRUISING OR BLEEDING  TENDERNESS IN MOUTH AND THROAT WITH OR WITHOUT PRESENCE OF ULCERS  *URINARY PROBLEMS  *BOWEL PROBLEMS  UNUSUAL RASH Items with * indicate a potential emergency and should be followed up as soon as possible.  Feel free to call the clinic you have any questions or concerns. The clinic phone number is [phone].   Gemcitabine injection What is this medicine? GEMCITABINE (jem SIT a been) is a chemotherapy drug. This medicine is used to treat many types of cancer like breast cancer, lung cancer, pancreatic cancer, and ovarian cancer. This medicine may be used for other purposes; ask your health care provider or pharmacist if you have questions. COMMON BRAND NAME(S): Gemzar What should I tell my health care provider before I take this medicine? They need to know if you have any of these conditions: -blood disorders -infection -kidney disease -liver disease -recent or ongoing radiation therapy -an unusual or allergic reaction to gemcitabine, other chemotherapy, other medicines, foods, dyes, or preservatives -pregnant or trying to get pregnant -breast-feeding How should I use this medicine? This drug is given as an infusion into a vein. It is administered in a hospital or clinic by a specially trained health care professional. Talk to  your pediatrician regarding the use of this medicine in children. Special care may be needed. Overdosage: If you think you have taken too much of this medicine contact a poison control center or emergency room at once. NOTE: This medicine is only for you. Do not share this medicine with others. What if I miss a dose? It is important not to miss your dose. Call your doctor or health care professional if you are unable to keep an appointment. What may interact with this medicine? -medicines to increase blood counts like filgrastim, pegfilgrastim, sargramostim -some other chemotherapy drugs like cisplatin -vaccines Talk to your doctor or health care professional before taking any of these medicines: -acetaminophen -aspirin -ibuprofen -ketoprofen -naproxen This list may not describe all possible interactions. Give your health care provider a list of all the medicines, herbs, non-prescription drugs, or dietary supplements you use. Also tell them if you smoke, drink alcohol, or use illegal drugs. Some items may interact with your medicine. What should I watch for while using this medicine? Visit your doctor for checks on your progress. This drug may make you feel generally unwell. This is not uncommon, as chemotherapy can affect healthy cells as well as cancer cells. Report any side effects. Continue your course of treatment even though you feel ill unless your doctor tells you to stop. In some cases, you may be given additional medicines to help with side effects. Follow all directions for their use. Call your doctor or health care professional for advice if you get a fever, chills or sore throat, or other symptoms of a cold or flu. Do not treat yourself. This drug decreases your body's ability  to fight infections. Try to avoid being around people who are sick. This medicine may increase your risk to bruise or bleed. Call your doctor or health care professional if you notice any unusual bleeding. Be  careful brushing and flossing your teeth or using a toothpick because you may get an infection or bleed more easily. If you have any dental work done, tell your dentist you are receiving this medicine. Avoid taking products that contain aspirin, acetaminophen, ibuprofen, naproxen, or ketoprofen unless instructed by your doctor. These medicines may hide a fever. Women should inform their doctor if they wish to become pregnant or think they might be pregnant. There is a potential for serious side effects to an unborn child. Talk to your health care professional or pharmacist for more information. Do not breast-feed an infant while taking this medicine. What side effects may I notice from receiving this medicine? Side effects that you should report to your doctor or health care professional as soon as possible: -allergic reactions like skin rash, itching or hives, swelling of the face, lips, or tongue -low blood counts - this medicine may decrease the number of white blood cells, red blood cells and platelets. You may be at increased risk for infections and bleeding. -signs of infection - fever or chills, cough, sore throat, pain or difficulty passing urine -signs of decreased platelets or bleeding - bruising, pinpoint red spots on the skin, black, tarry stools, blood in the urine -signs of decreased red blood cells - unusually weak or tired, fainting spells, lightheadedness -breathing problems -chest pain -mouth sores -nausea and vomiting -pain, swelling, redness at site where injected -pain, tingling, numbness in the hands or feet -stomach pain -swelling of ankles, feet, hands -unusual bleeding Side effects that usually do not require medical attention (report to your doctor or health care professional if they continue or are bothersome): -constipation -diarrhea -hair loss -loss of appetite -stomach upset This list may not describe all possible side effects. Call your doctor for medical  advice about side effects. You may report side effects to FDA at 1-800-FDA-1088. Where should I keep my medicine? This drug is given in a hospital or clinic and will not be stored at home. NOTE: This sheet is a summary. It may not cover all possible information. If you have questions about this medicine, talk to your doctor, pharmacist, or health care provider.  2015, [employer]. (2007-12-23 18:45:54)

## 2014-09-28 ENCOUNTER — Ambulatory Visit (HOSPITAL_COMMUNITY)
Admission: RE | Admit: 2014-09-28 | Discharge: 2014-09-28 | Disposition: A | Discharge status: Home / Self Care | Payer: 59 | Source: Ambulatory Visit | Attending: Physician Assistant | Admitting: Physician Assistant

## 2014-09-28 ENCOUNTER — Encounter (HOSPITAL_COMMUNITY): Payer: Self-pay

## 2014-09-28 ENCOUNTER — Other Ambulatory Visit: Payer: Self-pay | Admitting: Physician Assistant

## 2014-09-28 ENCOUNTER — Other Ambulatory Visit: Payer: Self-pay | Admitting: Internal Medicine

## 2014-09-28 DIAGNOSIS — Z79899 Other long term (current) drug therapy: Secondary | ICD-10-CM | POA: Insufficient documentation

## 2014-09-28 DIAGNOSIS — C3491 Malignant neoplasm of unspecified part of right bronchus or lung: Secondary | ICD-10-CM

## 2014-09-28 MED ORDER — IOHEXOL 300 MG/ML  SOLN
100.0000 mL | Freq: Once | INTRAMUSCULAR | Status: AC | PRN
  Administered 2014-09-28: 100 mL via INTRAVENOUS

## 2014-09-30 ENCOUNTER — Other Ambulatory Visit (HOSPITAL_BASED_OUTPATIENT_CLINIC_OR_DEPARTMENT_OTHER): Payer: 59

## 2014-09-30 ENCOUNTER — Ambulatory Visit: Payer: 59

## 2014-09-30 ENCOUNTER — Ambulatory Visit (HOSPITAL_BASED_OUTPATIENT_CLINIC_OR_DEPARTMENT_OTHER): Payer: 59

## 2014-09-30 ENCOUNTER — Other Ambulatory Visit: Payer: Self-pay | Admitting: Medical Oncology

## 2014-09-30 ENCOUNTER — Encounter: Payer: Self-pay | Admitting: Internal Medicine

## 2014-09-30 ENCOUNTER — Telehealth: Payer: Self-pay | Admitting: *Deleted

## 2014-09-30 ENCOUNTER — Ambulatory Visit (HOSPITAL_BASED_OUTPATIENT_CLINIC_OR_DEPARTMENT_OTHER): Payer: 59 | Admitting: Internal Medicine

## 2014-09-30 ENCOUNTER — Encounter: Payer: Self-pay | Admitting: *Deleted

## 2014-09-30 VITALS — BP 133/65 | HR 76 | Temp 98.4°F | Resp 18 | Ht 62.0 in | Wt 114.9 lb

## 2014-09-30 DIAGNOSIS — C3491 Malignant neoplasm of unspecified part of right bronchus or lung: Secondary | ICD-10-CM

## 2014-09-30 DIAGNOSIS — C3410 Malignant neoplasm of upper lobe, unspecified bronchus or lung: Secondary | ICD-10-CM

## 2014-09-30 DIAGNOSIS — C3431 Malignant neoplasm of lower lobe, right bronchus or lung: Secondary | ICD-10-CM

## 2014-09-30 DIAGNOSIS — R790 Abnormal level of blood mineral: Secondary | ICD-10-CM

## 2014-09-30 DIAGNOSIS — C787 Secondary malignant neoplasm of liver and intrahepatic bile duct: Secondary | ICD-10-CM

## 2014-09-30 DIAGNOSIS — C3490 Malignant neoplasm of unspecified part of unspecified bronchus or lung: Secondary | ICD-10-CM

## 2014-09-30 DIAGNOSIS — Z95828 Presence of other vascular implants and grafts: Secondary | ICD-10-CM

## 2014-09-30 LAB — CBC WITH DIFFERENTIAL/PLATELET
BASO%: 0.2 % (ref 0.0–2.0)
Basophils Absolute: 0 10*3/uL (ref 0.0–0.1)
EOS%: 0.2 % (ref 0.0–7.0)
Eosinophils Absolute: 0 10*3/uL (ref 0.0–0.5)
HEMATOCRIT: 28.3 % — AB (ref 34.8–46.6)
HGB: 9.3 g/dL — ABNORMAL LOW (ref 11.6–15.9)
LYMPH%: 21.6 % (ref 14.0–49.7)
MCH: 33.8 pg (ref 25.1–34.0)
MCHC: 32.9 g/dL (ref 31.5–36.0)
MCV: 102.9 fL — ABNORMAL HIGH (ref 79.5–101.0)
MONO#: 1.1 10*3/uL — ABNORMAL HIGH (ref 0.1–0.9)
MONO%: 16.6 % — ABNORMAL HIGH (ref 0.0–14.0)
NEUT#: 3.9 10*3/uL (ref 1.5–6.5)
NEUT%: 61.4 % (ref 38.4–76.8)
Platelets: 194 10*3/uL (ref 145–400)
RBC: 2.75 10*6/uL — ABNORMAL LOW (ref 3.70–5.45)
RDW: 17.6 % — ABNORMAL HIGH (ref 11.2–14.5)
WBC: 6.4 10*3/uL (ref 3.9–10.3)
lymph#: 1.4 10*3/uL (ref 0.9–3.3)

## 2014-09-30 LAB — COMPREHENSIVE METABOLIC PANEL (CC13)
ALT: 23 U/L (ref 0–55)
AST: 34 U/L (ref 5–34)
Albumin: 3.5 g/dL (ref 3.5–5.0)
Alkaline Phosphatase: 190 U/L — ABNORMAL HIGH (ref 40–150)
Anion Gap: 10 mEq/L (ref 3–11)
BUN: 16.9 mg/dL (ref 7.0–26.0)
CALCIUM: 9.4 mg/dL (ref 8.4–10.4)
CHLORIDE: 101 meq/L (ref 98–109)
CO2: 25 meq/L (ref 22–29)
CREATININE: 1.1 mg/dL (ref 0.6–1.1)
EGFR: 52 mL/min/{1.73_m2} — ABNORMAL LOW (ref >90)
GLUCOSE: 98 mg/dL (ref 70–140)
POTASSIUM: 4.5 meq/L (ref 3.5–5.1)
Sodium: 135 mEq/L — ABNORMAL LOW (ref 136–145)
Total Bilirubin: 0.39 mg/dL (ref 0.20–1.20)
Total Protein: 6.7 g/dL (ref 6.4–8.3)

## 2014-09-30 MED ORDER — SODIUM CHLORIDE 0.9 % IJ SOLN
10.0000 mL | INTRAMUSCULAR | Status: DC | PRN
  Administered 2014-09-30: 10 mL via INTRAVENOUS
  Filled 2014-09-30: qty 10

## 2014-09-30 MED ORDER — OMEPRAZOLE 40 MG PO CPDR: 40.0000 mg | DELAYED_RELEASE_CAPSULE | Freq: Every day | ORAL | Status: DC

## 2014-09-30 MED ORDER — PROCHLORPERAZINE EDISYLATE 5 MG/ML IJ SOLN
INTRAMUSCULAR | Status: AC
  Filled 2014-09-30: qty 2

## 2014-09-30 MED ORDER — HEPARIN SOD (PORK) LOCK FLUSH 100 UNIT/ML IV SOLN
500.0000 [IU] | Freq: Once | INTRAVENOUS | Status: AC
  Administered 2014-09-30: 500 [IU] via INTRAVENOUS
  Filled 2014-09-30: qty 5

## 2014-09-30 MED ORDER — MAGNESIUM OXIDE 400 (241.3 MG) MG PO TABS: 400.0000 mg | ORAL_TABLET | Freq: Two times a day (BID) | ORAL | Status: DC

## 2014-09-30 NOTE — Patient Instructions (Signed)
Smoking Cessation, Tips for Success  If you are ready to quit smoking, congratulations! You have chosen to help yourself be healthier. Cigarettes bring nicotine, tar, carbon monoxide, and other irritants into your body. Your lungs, heart, and blood vessels will be able to work better without these poisons. There are many different ways to quit smoking. Nicotine gum, nicotine patches, a nicotine inhaler, or nicotine nasal spray can help with physical craving. Hypnosis, support groups, and medicines help break the habit of smoking.  WHAT THINGS CAN I DO TO MAKE QUITTING EASIER?   Here are some tips to help you quit for good:  · Pick a date when you will quit smoking completely. Tell all of your friends and family about your plan to quit on that date.  · Do not try to slowly cut down on the number of cigarettes you are smoking. Pick a quit date and quit smoking completely starting on that day.  · Throw away all cigarettes.    · Clean and remove all ashtrays from your home, work, and car.  · On a card, write down your reasons for quitting. Carry the card with you and read it when you get the urge to smoke.  · Cleanse your body of nicotine. Drink enough water and fluids to keep your urine clear or pale yellow. Do this after quitting to flush the nicotine from your body.  · Learn to predict your moods. Do not let a bad situation be your excuse to have a cigarette. Some situations in your life might tempt you into wanting a cigarette.  · Never have "just one" cigarette. It leads to wanting another and another. Remind yourself of your decision to quit.  · Change habits associated with smoking. If you smoked while driving or when feeling stressed, try other activities to replace smoking. Stand up when drinking your coffee. Brush your teeth after eating. Sit in a different chair when you read the paper. Avoid alcohol while trying to quit, and try to drink fewer caffeinated beverages. Alcohol and caffeine may urge you to  smoke.  · Avoid foods and drinks that can trigger a desire to smoke, such as sugary or spicy foods and alcohol.  · Ask people who smoke not to smoke around you.  · Have something planned to do right after eating or having a cup of coffee. For example, plan to take a walk or exercise.  · Try a relaxation exercise to calm you down and decrease your stress. Remember, you may be tense and nervous for the first 2 weeks after you quit, but this will pass.  · Find new activities to keep your hands busy. Play with a pen, coin, or rubber band. Doodle or draw things on paper.  · Brush your teeth right after eating. This will help cut down on the craving for the taste of tobacco after meals. You can also try mouthwash.    · Use oral substitutes in place of cigarettes. Try using lemon drops, carrots, cinnamon sticks, or chewing gum. Keep them handy so they are available when you have the urge to smoke.  · When you have the urge to smoke, try deep breathing.  · Designate your home as a nonsmoking area.  · If you are a heavy smoker, ask your health care provider about a prescription for nicotine chewing gum. It can ease your withdrawal from nicotine.  · Reward yourself. Set aside the cigarette money you save and buy yourself something nice.  · Look for   support from others. Join a support group or smoking cessation program. Ask someone at home or at work to help you with your plan to quit smoking.  · Always ask yourself, "Do I need this cigarette or is this just a reflex?" Tell yourself, "Today, I choose not to smoke," or "I do not want to smoke." You are reminding yourself of your decision to quit.  · Do not replace cigarette smoking with electronic cigarettes (commonly called e-cigarettes). The safety of e-cigarettes is unknown, and some may contain harmful chemicals.  · If you relapse, do not give up! Plan ahead and think about what you will do the next time you get the urge to smoke.  HOW WILL I FEEL WHEN I QUIT SMOKING?  You  may have symptoms of withdrawal because your body is used to nicotine (the addictive substance in cigarettes). You may crave cigarettes, be irritable, feel very hungry, cough often, get headaches, or have difficulty concentrating. The withdrawal symptoms are only temporary. They are strongest when you first quit but will go away within 10-14 days. When withdrawal symptoms occur, stay in control. Think about your reasons for quitting. Remind yourself that these are signs that your body is healing and getting used to being without cigarettes. Remember that withdrawal symptoms are easier to treat than the major diseases that smoking can cause.   Even after the withdrawal is over, expect periodic urges to smoke. However, these cravings are generally short lived and will go away whether you smoke or not. Do not smoke!  WHAT RESOURCES ARE AVAILABLE TO HELP ME QUIT SMOKING?  Your health care provider can direct you to community resources or hospitals for support, which may include:  · Group support.  · Education.  · Hypnosis.  · Therapy.  Document Released: 05/11/2004 Document Revised: 12/28/2013 Document Reviewed: 01/29/2013  [employer]® Patient Information ©2015 [employer]. This information is not intended to replace advice given to you by your health care provider. Make sure you discuss any questions you have with your health care provider.

## 2014-09-30 NOTE — Telephone Encounter (Signed)
Per staff message and POF I have scheduled appts. Advised scheduler of appts. JMW  

## 2014-09-30 NOTE — Progress Notes (Signed)
[facility] [phone]   [phone]  OFFICE PROGRESS NOTE  Mathews Argyle, MD [address] [place] [address]  DIAGNOSIS: Stage IV (T1b, N2, M1b) non-small cell lung cancer, adenocarcinoma, presented with a right lower lobe lung mass as well as mediastinal, hilar, bilateral supraclavicular lymphadenopathy in addition to extensive liver metastases and retroperitoneal lymphadenopathy diagnosed in December of 2014.  MOLECULAR BIOMARKERS: [facility] ONE biomarker testing revealed TP53 E204*, KEAP1 R626f*25+, NFKBIA amplification, NKX2-1 amplification, SMARCA4 E463*. The patient was negative for EGFR and ALK gene translocation   PRIOR THERAPY:  1) Systemic chemotherapy with carboplatin for AUC of 5 and Alimta 500 mg/M2 every 3 weeks, status post 3 cycles. First dose 09/02/2013.  2)  Systemic chemotherapy with cisplatin 60 mg/M2 on days 1 and gemcitabine 800 mg/M2 on days 1 and 8 every 3 weeks, first cycle on 11/12/2013. Status post 12 cycles. Discontinued secondary to hypersensitivity reaction to cisplatin during cycle #12. 3) Systemic chemotherapy with gemcitabine 1000 mg/M2 on days 1 and 8 every 3 weeks, first cycle on 07/29/2014. Status post 3 cycles discontinued today secondary to disease progression.  CURRENT THERAPY: Immunotherapy with Nivolumab 3 MG/KG every 2 weeks. First cycle 10/05/2014.  CHEMOTHERAPY INTENT: palliative  CURRENT # OF CHEMOTHERAPY CYCLES: 1  CURRENT ANTIEMETICS: Zofran, dexamethasone and Compazine.  CURRENT SMOKING STATUS: former smoker  ORAL CHEMOTHERAPY AND CONSENT: None  CURRENT BISPHOSPHONATES USE: None  PAIN MANAGEMENT: 0/10  NARCOTICS INDUCED CONSTIPATION: None  LIVING WILL AND CODE STATUS: Full Code  INTERVAL HISTORY: Amber [place]89y.o. female returns to the clinic today for follow up visit accompanied by her husband. The patient is feeling fine today with no specific complaints  except for mild fatigue and recent mild right upper quadrant abdominal pain started after an episode of cough. She is currently on Cymbalta with mild improvement in her neuropathy. She tolerated the previous 3 cycles of her treatment with single agent gemcitabine fairly well except for the fatigue. The patient denied having any significant nausea or vomiting. She denied having any fever or chills. She has no significant weight loss or night sweats. She denied having any significant chest pain, shortness of breath, cough or hemoptysis. She had repeat CT scan of the chest, abdomen and pelvis performed recently and she is here for evaluation and discussion of her scan results.  MEDICAL HISTORY: Past Medical History  Diagnosis Date  . HTN (hypertension)   . lung ca dx'd 07/2013    ALLERGIES:  is allergic to cisplatin; hctz; and plendil.  MEDICATIONS:  Current Outpatient Prescriptions  Medication Sig Dispense Refill  . amLODipine (NORVASC) 10 MG tablet Take 10 mg by mouth every morning.     .Marland Kitchenaspirin EC 81 MG tablet Take 81 mg by mouth every morning.     . calcium carbonate (TUMS - DOSED IN MG ELEMENTAL CALCIUM) 500 MG chewable tablet Chew 1 tablet by mouth 3 (three) times daily as needed for indigestion or heartburn.     . DULoxetine (CYMBALTA) 30 MG capsule Take 1 capsule (30 mg total) by mouth daily. 90 capsule 1  . lidocaine-prilocaine (EMLA) cream APPLY 1 APPLICATION TOPICALLY AS NEEDED. APPLY TO PORT 1 HOUR BEFORE CHEMO APPOINTMENT 30 g 0  . lisinopril (PRINIVIL,ZESTRIL) 20 MG tablet Take 20 mg by mouth every morning.     . nebivolol (BYSTOLIC) 10 MG tablet Take 10 mg by mouth every morning.     . prochlorperazine (COMPAZINE) 10  MG tablet Take 1 tablet (10 mg total) by mouth every 6 (six) hours as needed for nausea or vomiting. 90 tablet 1  . magnesium oxide (MAG-OX) 400 (241.3 MG) MG tablet Take 1 tablet (400 mg total) by mouth 2 (two) times daily. 180 tablet 1  . omeprazole (PRILOSEC) 40 MG  capsule Take 1 capsule (40 mg total) by mouth daily. 90 capsule 1  . oxyCODONE (ROXICODONE) 5 MG immediate release tablet Take 1 tablet (5 mg total) by mouth every 6 (six) hours as needed for severe pain. (Patient not taking: Reported on 09/09/2014) 60 tablet 0   No current facility-administered medications for this visit.   Facility-Administered Medications Ordered in Other Visits  Medication Dose Route Frequency Provider Last Rate Last Dose  . sodium chloride 0.9 % injection 10 mL  10 mL Intravenous PRN Curt Bears, MD   10 mL at 09/30/14 6808    REVIEW OF SYSTEMS:  Constitutional: positive for fatigue Eyes: negative Ears, nose, mouth, throat, and face: negative Respiratory: negative Cardiovascular: negative Gastrointestinal: negative Genitourinary:negative Integument/breast: negative Hematologic/lymphatic: negative Musculoskeletal:negative Neurological: positive for paresthesia Behavioral/Psych: negative Endocrine: negative Allergic/Immunologic: negative   PHYSICAL EXAMINATION: General appearance: alert, cooperative and no distress Head: Normocephalic, without obvious abnormality, atraumatic Neck: no adenopathy, no JVD, supple, symmetrical, trachea midline and thyroid not enlarged, symmetric, no tenderness/mass/nodules Lymph nodes: Cervical, supraclavicular, and axillary nodes normal. Resp: clear to auscultation bilaterally Back: symmetric, no curvature. ROM normal. No CVA tenderness. Cardio: regular rate and rhythm, S1, S2 normal, no murmur, click, rub or gallop GI: soft, non-tender; bowel sounds normal; no masses,  no organomegaly Extremities: extremities normal, atraumatic, no cyanosis or edema Neurologic: Alert and oriented X 3, normal strength and tone. Normal symmetric reflexes. Normal coordination and gait  ECOG PERFORMANCE STATUS: 1 - Symptomatic but completely ambulatory  Blood pressure 133/65, pulse 76, temperature 98.4 F (36.9 C), temperature source Oral,  resp. rate 18, height _0  (1.575 m), weight 114 lb 14.4 oz (52.118 kg), SpO2 100 %.  LABORATORY DATA: Lab Results  Component Value Date   WBC 6.4 09/30/2014   HGB 9.3* 09/30/2014   HCT 28.3* 09/30/2014   MCV 102.9* 09/30/2014   PLT 194 09/30/2014      Chemistry      Component Value Date/Time   NA 135* 09/30/2014 0907   NA 135 05/13/2014 1007   K 4.5 09/30/2014 0907   K 4.5 05/13/2014 1007   CL 96 05/13/2014 1007   CO2 25 09/30/2014 0907   CO2 29 05/13/2014 1007   BUN 16.9 09/30/2014 0907   BUN 18 05/13/2014 1007   CREATININE 1.1 09/30/2014 0907   CREATININE 1.00 05/13/2014 1007      Component Value Date/Time   CALCIUM 9.4 09/30/2014 0907   CALCIUM 9.5 05/13/2014 1007       RADIOGRAPHIC STUDIES: Ct Chest W Contrast  09/28/2014   CLINICAL DATA:  Restaging right non-small-cell lung cancer diagnosed 2014, chemotherapy in progress  EXAM: CT CHEST, ABDOMEN, AND PELVIS WITH CONTRAST  TECHNIQUE: Multidetector CT imaging of the chest, abdomen and pelvis was performed following the standard protocol during bolus administration of intravenous contrast.  CONTRAST:  158m OMNIPAQUE IOHEXOL 300 MG/ML  SOLN  COMPARISON:  07/27/2014  FINDINGS: CT CHEST FINDINGS  Mediastinum/Nodes: The heart is normal in size. No pericardial effusion.  Mild atherosclerotic calcifications of the aortic arch.  No suspicious mediastinal, hilar, or axillary lymphadenopathy.  Right chest port terminating at the cavoatrial junction.  Lungs/Pleura: 14 x 11 mm  spiculated opacity in the central right lower lobe (series 4/ image 32), unchanged. Suspected surrounding radiation changes.  Additional mucous plugging/nodularity in the right lower lobe (series 4/ images 33-34). Biapical pleural parenchymal scarring. Subpleural scarring nodule in the lateral left upper lobe likely reflects additional subpleural scarring (series 4/image 7).  Mild centrilobular emphysematous changes.  No pleural effusion or pneumothorax.   Musculoskeletal: Mild degenerative changes of the thoracic spine.  CT ABDOMEN PELVIS FINDINGS  Hepatobiliary: Progression of hepatic metastases. While the previously described index lesions are smaller, there are additional lesions which have progressed. For example:  --4.2 x 3.5 cm lesion in segment VI (series 2/image 53), previously 3.5 x 2.7 cm  --2.9 x 2.3 cm lesion in segment II (series 2/image 47), previously 1.8 x 1.8 cm  --1.5 x 1.6 cm lesion in segment IVA (series 2/ image 46), previously 7 mm  Gallbladder is unremarkable.  Pancreas: Within normal limits.  Spleen: Within normal limits.  Adrenals/Urinary Tract: Right adrenal gland is unremarkable. Mild nodular thickening of the left adrenal gland (series 2/image 51).  Kidneys are within normal limits.  No hydronephrosis.  Bladder is unremarkable.  Stomach/Bowel: Stomach is mildly thick-walled but unremarkable.  No evidence of bowel obstruction.  Normal appendix.  Vascular/Lymphatic: Atherosclerotic calcifications of the abdominal aorta and branch vessels.  No suspicious abdominopelvic lymphadenopathy.  Reproductive: Stable appearance of the uterus, likely reflecting uterine fibroids.  No adnexal masses.  Other: No abdominopelvic ascites.  Musculoskeletal: Mild degenerative changes at L5-S1.  IMPRESSION: Stable 14 mm right lower lobe pulmonary nodule with surrounding radiation changes.  Progression of hepatic metastases.  Additional stable ancillary findings above.   Electronically Signed   By: Julian Hy M.D.   On: 09/28/2014 13:09   Ct Abdomen Pelvis W Contrast  09/28/2014   CLINICAL DATA:  Restaging right non-small-cell lung cancer diagnosed 2014, chemotherapy in progress  EXAM: CT CHEST, ABDOMEN, AND PELVIS WITH CONTRAST  TECHNIQUE: Multidetector CT imaging of the chest, abdomen and pelvis was performed following the standard protocol during bolus administration of intravenous contrast.  CONTRAST:  167m OMNIPAQUE IOHEXOL 300 MG/ML  SOLN   COMPARISON:  07/27/2014  FINDINGS: CT CHEST FINDINGS  Mediastinum/Nodes: The heart is normal in size. No pericardial effusion.  Mild atherosclerotic calcifications of the aortic arch.  No suspicious mediastinal, hilar, or axillary lymphadenopathy.  Right chest port terminating at the cavoatrial junction.  Lungs/Pleura: 14 x 11 mm spiculated opacity in the central right lower lobe (series 4/ image 32), unchanged. Suspected surrounding radiation changes.  Additional mucous plugging/nodularity in the right lower lobe (series 4/ images 33-34). Biapical pleural parenchymal scarring. Subpleural scarring nodule in the lateral left upper lobe likely reflects additional subpleural scarring (series 4/image 7).  Mild centrilobular emphysematous changes.  No pleural effusion or pneumothorax.  Musculoskeletal: Mild degenerative changes of the thoracic spine.  CT ABDOMEN PELVIS FINDINGS  Hepatobiliary: Progression of hepatic metastases. While the previously described index lesions are smaller, there are additional lesions which have progressed. For example:  --4.2 x 3.5 cm lesion in segment VI (series 2/image 53), previously 3.5 x 2.7 cm  --2.9 x 2.3 cm lesion in segment II (series 2/image 47), previously 1.8 x 1.8 cm  --1.5 x 1.6 cm lesion in segment IVA (series 2/ image 46), previously 7 mm  Gallbladder is unremarkable.  Pancreas: Within normal limits.  Spleen: Within normal limits.  Adrenals/Urinary Tract: Right adrenal gland is unremarkable. Mild nodular thickening of the left adrenal gland (series 2/image 51).  Kidneys are  within normal limits.  No hydronephrosis.  Bladder is unremarkable.  Stomach/Bowel: Stomach is mildly thick-walled but unremarkable.  No evidence of bowel obstruction.  Normal appendix.  Vascular/Lymphatic: Atherosclerotic calcifications of the abdominal aorta and branch vessels.  No suspicious abdominopelvic lymphadenopathy.  Reproductive: Stable appearance of the uterus, likely reflecting uterine  fibroids.  No adnexal masses.  Other: No abdominopelvic ascites.  Musculoskeletal: Mild degenerative changes at L5-S1.  IMPRESSION: Stable 14 mm right lower lobe pulmonary nodule with surrounding radiation changes.  Progression of hepatic metastases.  Additional stable ancillary findings above.   Electronically Signed   By: Julian Hy M.D.   On: 09/28/2014 13:09    ASSESSMENT AND PLAN: This is a very pleasant 64 years old white female recently diagnosed with metastatic non-small cell lung cancer, adenocarcinoma presented with right lower lobe lung mass in addition to mediastinal and supraclavicular lymphadenopathy as well as diffuse metastatic liver lesions and retroperitoneal lymphadenopathy. She underwent 3 cycles of systemic chemotherapy with carboplatin and Alimta but unfortunately has evidence for disease progression especially in the liver and new small left adrenal metastasis. She is currently undergoing second line chemotherapy with cisplatin and gemcitabine status post 12 cycles and tolerating it fairly well patient after reducing the dose of cisplatin. This treatment was discontinued after the patient developed hypersensitivity reaction to cisplatin. The patient also completed 3 cycles of chemotherapy with single agent gemcitabine but unfortunately the recent CT scan of the chest, abdomen and pelvis showed evidence for disease progression especially in the liver. I discussed the scan results with the patient and her husband. I also discussed with her the treatment options including palliative care and hospice referral versus consideration of treatment with immunotherapy with single agent Nivolumab 3 MG/KG every 2 weeks versus systemic chemotherapy with docetaxel and Cyramza. After discussion of all the options the patient prefers to proceed with the immunotherapy. I discussed with her the adverse effect of this treatment including but not limited to immune mediated skin rash, diarrhea,  liver, renal, thyroid or endocrine dysfunction. She is expected to start the first cycle of this treatment next week. The patient would come back for follow-up visit in 3 weeks with the start of cycle #2. She was advised to call immediately if she has any concerning symptoms in the interval. The patient voices understanding of current disease status and treatment options and is in agreement with the current care plan.  All questions were answered. The patient knows to call the clinic with any problems, questions or concerns. We can certainly see the patient much sooner if necessary.  Disclaimer: This note was dictated with voice recognition software. Similar sounding words can inadvertently be transcribed and may not be corrected upon review.

## 2014-09-30 NOTE — Patient Instructions (Signed)

## 2014-09-30 NOTE — Progress Notes (Signed)
Per Dr Julien Nordmann, no treatment today, only needs port flush.

## 2014-09-30 NOTE — [facility] Oncology Navigator Note (Unsigned)
Spoke with patient today at [facility].  Dr. Julien Nordmann informed her that she has disease progression. She will have treatment change.  She will start immunotherapy next week.  She stated "that sounds like a plan".  It was nice seeing both her and her husband today.

## 2014-10-05 ENCOUNTER — Ambulatory Visit: Payer: 59

## 2014-10-05 ENCOUNTER — Ambulatory Visit (HOSPITAL_BASED_OUTPATIENT_CLINIC_OR_DEPARTMENT_OTHER): Payer: 59

## 2014-10-05 ENCOUNTER — Other Ambulatory Visit (HOSPITAL_BASED_OUTPATIENT_CLINIC_OR_DEPARTMENT_OTHER): Payer: 59

## 2014-10-05 DIAGNOSIS — Z95828 Presence of other vascular implants and grafts: Secondary | ICD-10-CM

## 2014-10-05 DIAGNOSIS — Z5112 Encounter for antineoplastic immunotherapy: Secondary | ICD-10-CM

## 2014-10-05 DIAGNOSIS — C3431 Malignant neoplasm of lower lobe, right bronchus or lung: Secondary | ICD-10-CM

## 2014-10-05 DIAGNOSIS — C3491 Malignant neoplasm of unspecified part of right bronchus or lung: Secondary | ICD-10-CM

## 2014-10-05 DIAGNOSIS — C787 Secondary malignant neoplasm of liver and intrahepatic bile duct: Secondary | ICD-10-CM

## 2014-10-05 DIAGNOSIS — I10 Essential (primary) hypertension: Secondary | ICD-10-CM

## 2014-10-05 LAB — CBC WITH DIFFERENTIAL/PLATELET
BASO%: 0.2 % (ref 0.0–2.0)
Basophils Absolute: 0 10*3/uL (ref 0.0–0.1)
EOS%: 0.7 % (ref 0.0–7.0)
Eosinophils Absolute: 0 10*3/uL (ref 0.0–0.5)
HEMATOCRIT: 30 % — AB (ref 34.8–46.6)
HGB: 9.9 g/dL — ABNORMAL LOW (ref 11.6–15.9)
LYMPH#: 1.4 10*3/uL (ref 0.9–3.3)
LYMPH%: 26.3 % (ref 14.0–49.7)
MCH: 33.8 pg (ref 25.1–34.0)
MCHC: 33 g/dL (ref 31.5–36.0)
MCV: 102.4 fL — ABNORMAL HIGH (ref 79.5–101.0)
MONO#: 0.8 10*3/uL (ref 0.1–0.9)
MONO%: 14.2 % — ABNORMAL HIGH (ref 0.0–14.0)
NEUT#: 3.2 10*3/uL (ref 1.5–6.5)
NEUT%: 58.6 % (ref 38.4–76.8)
Platelets: 291 10*3/uL (ref 145–400)
RBC: 2.93 10*6/uL — ABNORMAL LOW (ref 3.70–5.45)
RDW: 16.1 % — AB (ref 11.2–14.5)
WBC: 5.4 10*3/uL (ref 3.9–10.3)

## 2014-10-05 LAB — COMPREHENSIVE METABOLIC PANEL (CC13)
ALBUMIN: 3.4 g/dL — AB (ref 3.5–5.0)
ALT: 24 U/L (ref 0–55)
ANION GAP: 11 meq/L (ref 3–11)
AST: 34 U/L (ref 5–34)
Alkaline Phosphatase: 195 U/L — ABNORMAL HIGH (ref 40–150)
BUN: 19.8 mg/dL (ref 7.0–26.0)
CALCIUM: 9.5 mg/dL (ref 8.4–10.4)
CHLORIDE: 102 meq/L (ref 98–109)
CO2: 23 mEq/L (ref 22–29)
Creatinine: 1.2 mg/dL — ABNORMAL HIGH (ref 0.6–1.1)
EGFR: 46 mL/min/{1.73_m2} — AB (ref >90)
GLUCOSE: 101 mg/dL (ref 70–140)
Potassium: 4.6 mEq/L (ref 3.5–5.1)
Sodium: 136 mEq/L (ref 136–145)
TOTAL PROTEIN: 6.8 g/dL (ref 6.4–8.3)
Total Bilirubin: 0.35 mg/dL (ref 0.20–1.20)

## 2014-10-05 LAB — TSH CHCC: TSH: 3.823 m(IU)/L (ref 0.308–3.960)

## 2014-10-05 MED ORDER — HEPARIN SOD (PORK) LOCK FLUSH 100 UNIT/ML IV SOLN
500.0000 [IU] | Freq: Once | INTRAVENOUS | Status: AC | PRN
  Administered 2014-10-05: 500 [IU]
  Filled 2014-10-05: qty 5

## 2014-10-05 MED ORDER — SODIUM CHLORIDE 0.9 % IJ SOLN
10.0000 mL | INTRAMUSCULAR | Status: DC | PRN
  Administered 2014-10-05: 10 mL via INTRAVENOUS
  Filled 2014-10-05: qty 10

## 2014-10-05 MED ORDER — SODIUM CHLORIDE 0.9 % IV SOLN
Freq: Once | INTRAVENOUS | Status: AC
  Administered 2014-10-05: 10:00:00 via INTRAVENOUS

## 2014-10-05 MED ORDER — SODIUM CHLORIDE 0.9 % IJ SOLN
10.0000 mL | INTRAMUSCULAR | Status: DC | PRN
  Administered 2014-10-05: 10 mL
  Filled 2014-10-05: qty 10

## 2014-10-05 MED ORDER — SODIUM CHLORIDE 0.9 % IV SOLN
3.0000 mg/kg | Freq: Once | INTRAVENOUS | Status: AC
  Administered 2014-10-05: 160 mg via INTRAVENOUS
  Filled 2014-10-05: qty 16

## 2014-10-05 NOTE — Patient Instructions (Signed)
[facility] Discharge Instructions for Patients Receiving Biotherapy Today you received the following drug-Nivolumab  To help prevent nausea and vomiting after your treatment, we encourage you to take your nausea medication as prescribed   If you develop nausea and vomiting that is not controlled by your nausea medication, call the clinic.   BELOW ARE SYMPTOMS THAT SHOULD BE REPORTED IMMEDIATELY:  *FEVER GREATER THAN 100.5 F  *CHILLS WITH OR WITHOUT FEVER  NAUSEA AND VOMITING THAT IS NOT CONTROLLED WITH YOUR NAUSEA MEDICATION  *UNUSUAL SHORTNESS OF BREATH  *UNUSUAL BRUISING OR BLEEDING  TENDERNESS IN MOUTH AND THROAT WITH OR WITHOUT PRESENCE OF ULCERS  *URINARY PROBLEMS  *BOWEL PROBLEMS  UNUSUAL RASH Items with * indicate a potential emergency and should be followed up as soon as possible.  Nivolumab injection What is this medicine? NIVOLUMAB (nye VOL ue mab) is used to treat certain types of melanoma and lung cancer. This medicine may be used for other purposes; ask your health care provider or pharmacist if you have questions. COMMON BRAND NAME(S): Opdivo What should I tell my health care provider before I take this medicine? They need to know if you have any of these conditions: -eye disease, vision problems -history of pancreatitis -immune system problems -inflammatory bowel disease -kidney disease -liver disease -lung disease -lupus -myasthenia gravis -multiple sclerosis -organ transplant -stomach or intestine problems -thyroid disease -tingling of the fingers or toes, or other nerve disorder -an unusual or allergic reaction to nivolumab, other medicines, foods, dyes, or preservatives -pregnant or trying to get pregnant -breast-feeding How should I use this medicine? This medicine is for infusion into a vein. It is given by a health care professional in a hospital or clinic setting. A special MedGuide will be given to you before each  treatment. Be sure to read this information carefully each time. Talk to your pediatrician regarding the use of this medicine in children. Special care may be needed. Overdosage: If you think you've taken too much of this medicine contact a poison control center or emergency room at once. Overdosage: If you think you have taken too much of this medicine contact a poison control center or emergency room at once. NOTE: This medicine is only for you. Do not share this medicine with others. What if I miss a dose? It is important not to miss your dose. Call your doctor or health care professional if you are unable to keep an appointment. What may interact with this medicine? Interactions have not been studied. This list may not describe all possible interactions. Give your health care provider a list of all the medicines, herbs, non-prescription drugs, or dietary supplements you use. Also tell them if you smoke, drink alcohol, or use illegal drugs. Some items may interact with your medicine. What should I watch for while using this medicine? Tell your doctor or healthcare professional if your symptoms do not start to get better or if they get worse. Your condition will be monitored carefully while you are receiving this medicine. You may need blood work done while you are taking this medicine. What side effects may I notice from receiving this medicine? Side effects that you should report to your doctor or health care professional as soon as possible: -allergic reactions like skin rash, itching or hives, swelling of the face, lips, or tongue -black, tarry stools -bloody or watery diarrhea -changes in vision -chills -cough -depressed mood -eye pain -feeling anxious -fever -general ill feeling or flu-like symptoms -hair loss -loss  of appetite -low blood counts - this medicine may decrease the number of white blood cells, red blood cells and platelets. You may be at increased risk for infections  and bleeding -pain, tingling, numbness in the hands or feet -redness, blistering, peeling or loosening of the skin, including inside the mouth -red pinpoint spots on skin -signs of decreased platelets or bleeding - bruising, pinpoint red spots on the skin, black, tarry stools, blood in the urine -signs of decreased red blood cells - unusually weak or tired, feeling faint or lightheaded, falls -signs of infection - fever or chills, cough, sore throat, pain or trouble passing urine -signs and symptoms of a dangerous change in heartbeat or heart rhythm like chest pain; dizziness; fast or irregular heartbeat; palpitations; feeling faint or lightheaded, falls; breathing problems -signs and symptoms of high blood sugar such as dizziness; dry mouth; dry skin; fruity breath; nausea; stomach pain; increased hunger or thirst; increased urination -signs and symptoms of kidney injury like trouble passing urine or change in the amount of urine -signs and symptoms of liver injury like dark yellow or brown urine; general ill feeling or flu-like symptoms; light-colored stools; loss of appetite; nausea; right upper belly pain; unusually weak or tired; yellowing of the eyes or skin -signs and symptoms of increased potassium like muscle weakness; chest pain; or fast, irregular heartbeat -signs and symptoms of low potassium like muscle cramps or muscle pain; chest pain; dizziness; feeling faint or lightheaded, falls; palpitations; breathing problems; or fast, irregular heartbeat -swelling of the ankles, feet, hands -weight gainSide effects that usually do not require medical attention (report to your doctor or health care professional if they continue or are bothersome): -constipation -general ill feeling or flu-like symptoms -hair loss -loss of appetite -nausea, vomiting This list may not describe all possible side effects. Call your doctor for medical advice about side effects. You may report side effects to FDA  at 1-800-FDA-1088. Where should I keep my medicine? This drug is given in a hospital or clinic and will not be stored at home. NOTE: This sheet is a summary. It may not cover all possible information. If you have questions about this medicine, talk to your doctor, pharmacist, or health care provider.  2015, Elsevier/Gold Standard. (2013-11-02 13:18:19)   Feel free to call the clinic you have any questions or concerns. The clinic phone number is [phone].

## 2014-10-05 NOTE — Patient Instructions (Signed)

## 2014-10-06 ENCOUNTER — Telehealth: Payer: Self-pay | Admitting: *Deleted

## 2014-10-06 NOTE — Telephone Encounter (Signed)
-----   Message from [place], RN sent at 10/05/2014 12:25 PM EST ----- Regarding: chemo follow-up 1st Nivolumab Dr. Julien Nordmann

## 2014-10-06 NOTE — Telephone Encounter (Signed)
Called and spoke to pt's husband.  Pt is still sleeping.  He states she is feeling well and has no complaints.

## 2014-10-07 ENCOUNTER — Ambulatory Visit: Payer: 59

## 2014-10-07 ENCOUNTER — Other Ambulatory Visit: Payer: 59

## 2014-10-19 ENCOUNTER — Telehealth: Payer: Self-pay | Admitting: Physician Assistant

## 2014-10-19 ENCOUNTER — Ambulatory Visit (HOSPITAL_BASED_OUTPATIENT_CLINIC_OR_DEPARTMENT_OTHER): Payer: 59

## 2014-10-19 ENCOUNTER — Ambulatory Visit (HOSPITAL_BASED_OUTPATIENT_CLINIC_OR_DEPARTMENT_OTHER): Payer: 59 | Admitting: Physician Assistant

## 2014-10-19 ENCOUNTER — Other Ambulatory Visit (HOSPITAL_BASED_OUTPATIENT_CLINIC_OR_DEPARTMENT_OTHER): Payer: 59

## 2014-10-19 ENCOUNTER — Ambulatory Visit: Payer: 59

## 2014-10-19 ENCOUNTER — Encounter: Payer: Self-pay | Admitting: Physician Assistant

## 2014-10-19 VITALS — Resp 18

## 2014-10-19 VITALS — BP 125/60 | HR 72 | Temp 97.7°F | Wt 116.0 lb

## 2014-10-19 DIAGNOSIS — Z5112 Encounter for antineoplastic immunotherapy: Secondary | ICD-10-CM

## 2014-10-19 DIAGNOSIS — C3491 Malignant neoplasm of unspecified part of right bronchus or lung: Secondary | ICD-10-CM

## 2014-10-19 DIAGNOSIS — C3431 Malignant neoplasm of lower lobe, right bronchus or lung: Secondary | ICD-10-CM

## 2014-10-19 DIAGNOSIS — C781 Secondary malignant neoplasm of mediastinum: Secondary | ICD-10-CM

## 2014-10-19 DIAGNOSIS — C787 Secondary malignant neoplasm of liver and intrahepatic bile duct: Secondary | ICD-10-CM

## 2014-10-19 DIAGNOSIS — C786 Secondary malignant neoplasm of retroperitoneum and peritoneum: Secondary | ICD-10-CM

## 2014-10-19 DIAGNOSIS — Z95828 Presence of other vascular implants and grafts: Secondary | ICD-10-CM

## 2014-10-19 LAB — CBC WITH DIFFERENTIAL/PLATELET
BASO%: 0.2 % (ref 0.0–2.0)
Basophils Absolute: 0 10*3/uL (ref 0.0–0.1)
EOS%: 0.7 % (ref 0.0–7.0)
Eosinophils Absolute: 0 10*3/uL (ref 0.0–0.5)
HCT: 30.8 % — ABNORMAL LOW (ref 34.8–46.6)
HGB: 10.1 g/dL — ABNORMAL LOW (ref 11.6–15.9)
LYMPH%: 30.3 % (ref 14.0–49.7)
MCH: 33.6 pg (ref 25.1–34.0)
MCHC: 32.8 g/dL (ref 31.5–36.0)
MCV: 102.3 fL — ABNORMAL HIGH (ref 79.5–101.0)
MONO#: 0.5 10*3/uL (ref 0.1–0.9)
MONO%: 9.5 % (ref 0.0–14.0)
NEUT#: 3.3 10*3/uL (ref 1.5–6.5)
NEUT%: 59.3 % (ref 38.4–76.8)
PLATELETS: 173 10*3/uL (ref 145–400)
RBC: 3.01 10*6/uL — AB (ref 3.70–5.45)
RDW: 14.2 % (ref 11.2–14.5)
WBC: 5.6 10*3/uL (ref 3.9–10.3)
lymph#: 1.7 10*3/uL (ref 0.9–3.3)

## 2014-10-19 LAB — COMPREHENSIVE METABOLIC PANEL (CC13)
ALK PHOS: 244 U/L — AB (ref 40–150)
ALT: 25 U/L (ref 0–55)
AST: 35 U/L — AB (ref 5–34)
Albumin: 3.3 g/dL — ABNORMAL LOW (ref 3.5–5.0)
Anion Gap: 9 mEq/L (ref 3–11)
BUN: 19 mg/dL (ref 7.0–26.0)
CALCIUM: 9.3 mg/dL (ref 8.4–10.4)
CHLORIDE: 105 meq/L (ref 98–109)
CO2: 24 mEq/L (ref 22–29)
Creatinine: 1.1 mg/dL (ref 0.6–1.1)
EGFR: 53 mL/min/{1.73_m2} — ABNORMAL LOW (ref >90)
Glucose: 143 mg/dl — ABNORMAL HIGH (ref 70–140)
POTASSIUM: 4.3 meq/L (ref 3.5–5.1)
SODIUM: 139 meq/L (ref 136–145)
TOTAL PROTEIN: 6.6 g/dL (ref 6.4–8.3)
Total Bilirubin: 0.41 mg/dL (ref 0.20–1.20)

## 2014-10-19 MED ORDER — SODIUM CHLORIDE 0.9 % IJ SOLN
10.0000 mL | INTRAMUSCULAR | Status: DC | PRN
  Administered 2014-10-19: 10 mL
  Filled 2014-10-19: qty 10

## 2014-10-19 MED ORDER — SODIUM CHLORIDE 0.9 % IV SOLN
Freq: Once | INTRAVENOUS | Status: AC
  Administered 2014-10-19: 16:00:00 via INTRAVENOUS

## 2014-10-19 MED ORDER — SODIUM CHLORIDE 0.9 % IV SOLN
3.0000 mg/kg | Freq: Once | INTRAVENOUS | Status: AC
  Administered 2014-10-19: 160 mg via INTRAVENOUS
  Filled 2014-10-19: qty 16

## 2014-10-19 MED ORDER — HEPARIN SOD (PORK) LOCK FLUSH 100 UNIT/ML IV SOLN
500.0000 [IU] | Freq: Once | INTRAVENOUS | Status: AC | PRN
  Administered 2014-10-19: 500 [IU]
  Filled 2014-10-19: qty 5

## 2014-10-19 MED ORDER — SODIUM CHLORIDE 0.9 % IJ SOLN
10.0000 mL | INTRAMUSCULAR | Status: DC | PRN
  Administered 2014-10-19: 10 mL via INTRAVENOUS
  Filled 2014-10-19: qty 10

## 2014-10-19 MED ORDER — HEPARIN SOD (PORK) LOCK FLUSH 100 UNIT/ML IV SOLN
500.0000 [IU] | Freq: Once | INTRAVENOUS | Status: AC
  Administered 2014-10-19: 500 [IU] via INTRAVENOUS
  Filled 2014-10-19: qty 5

## 2014-10-19 NOTE — Patient Instructions (Signed)
[facility] Discharge Instructions for Patients Receiving Chemotherapy  Today you received the following chemotherapy agents Nivolumab To help prevent nausea and vomiting after your treatment, we encourage you to take your nausea medication as prescribed.   If you develop nausea and vomiting that is not controlled by your nausea medication, call the clinic.   BELOW ARE SYMPTOMS THAT SHOULD BE REPORTED IMMEDIATELY:  *FEVER GREATER THAN 100.5 F  *CHILLS WITH OR WITHOUT FEVER  NAUSEA AND VOMITING THAT IS NOT CONTROLLED WITH YOUR NAUSEA MEDICATION  *UNUSUAL SHORTNESS OF BREATH  *UNUSUAL BRUISING OR BLEEDING  TENDERNESS IN MOUTH AND THROAT WITH OR WITHOUT PRESENCE OF ULCERS  *URINARY PROBLEMS  *BOWEL PROBLEMS  UNUSUAL RASH Items with * indicate a potential emergency and should be followed up as soon as possible.  Feel free to call the clinic you have any questions or concerns. The clinic phone number is [phone].

## 2014-10-19 NOTE — Telephone Encounter (Signed)
Pt confirmed labs/ov per 02/23 POF, gave pt AVS... KJ, sent msg to add chemo

## 2014-10-19 NOTE — Progress Notes (Addendum)
[place] [phone]   [phone]  OFFICE PROGRESS NOTE  Mathews Argyle, MD [address] [place] [address]  DIAGNOSIS: Stage IV (T1b, N2, M1b) non-small cell lung cancer, adenocarcinoma, presented with a right lower lobe lung mass as well as mediastinal, hilar, bilateral supraclavicular lymphadenopathy in addition to extensive liver metastases and retroperitoneal lymphadenopathy diagnosed in December of 2014.  MOLECULAR BIOMARKERS: [facility] biomarker testing revealed TP53 E204*, KEAP1 R683f*25+, NFKBIA amplification, NKX2-1 amplification, SMARCA4 E463*. The patient was negative for EGFR and ALK gene translocation   PRIOR THERAPY:  1) Systemic chemotherapy with carboplatin for AUC of 5 and Alimta 500 mg/M2 every 3 weeks, status post 3 cycles. First dose 09/02/2013.  2)  Systemic chemotherapy with cisplatin 60 mg/M2 on days 1 and gemcitabine 800 mg/M2 on days 1 and 8 every 3 weeks, first cycle on 11/12/2013. Status post 12 cycles. Discontinued secondary to hypersensitivity reaction to cisplatin during cycle #12. 3) Systemic chemotherapy with gemcitabine 1000 mg/M2 on days 1 and 8 every 3 weeks, first cycle on 07/29/2014. Status post 3 cycles discontinued today secondary to disease progression.  CURRENT THERAPY: Immunotherapy with Nivolumab 3 MG/KG every 2 weeks. First cycle 10/05/2014. Status post 1 cycle  CHEMOTHERAPY INTENT: palliative  CURRENT # OF CHEMOTHERAPY CYCLES: 2  CURRENT ANTIEMETICS: Zofran, dexamethasone and Compazine.  CURRENT SMOKING STATUS: former smoker  ORAL CHEMOTHERAPY AND CONSENT: None  CURRENT BISPHOSPHONATES USE: None  PAIN MANAGEMENT: 0/10  NARCOTICS INDUCED CONSTIPATION: None  LIVING WILL AND CODE STATUS: Full Code  INTERVAL HISTORY: Amber COLASANTI64y.o. female returns to the clinic today for follow up visit accompanied by her husband. The patient is feeling fine today with no  specific complaints except for mild fatigue. She tolerated her first cycle of immunotherapy with Nivolumab without difficulty. She denied any shortness of breath, diarrhea or skin rash. She is currently on Cymbalta with mild improvement in her neuropathy.  The patient denied having any significant nausea or vomiting. She denied having any fever or chills. She has no significant weight loss or night sweats. She denied having any significant chest pain, shortness of breath, cough or hemoptysis. She is wondering whether she needs to still take the magnesium supplement.  MEDICAL HISTORY: Past Medical History  Diagnosis Date  . HTN (hypertension)   . lung ca dx'd 07/2013    ALLERGIES:  is allergic to cisplatin; hctz; and plendil.  MEDICATIONS:  Current Outpatient Prescriptions  Medication Sig Dispense Refill  . amLODipine (NORVASC) 10 MG tablet Take 10 mg by mouth every morning.     .Marland Kitchenaspirin EC 81 MG tablet Take 81 mg by mouth every morning.     . calcium carbonate (TUMS - DOSED IN MG ELEMENTAL CALCIUM) 500 MG chewable tablet Chew 1 tablet by mouth 3 (three) times daily as needed for indigestion or heartburn.     . DULoxetine (CYMBALTA) 30 MG capsule Take 1 capsule (30 mg total) by mouth daily. 90 capsule 1  . lidocaine-prilocaine (EMLA) cream APPLY 1 APPLICATION TOPICALLY AS NEEDED. APPLY TO PORT 1 HOUR BEFORE CHEMO APPOINTMENT 30 g 0  . lisinopril (PRINIVIL,ZESTRIL) 20 MG tablet Take 20 mg by mouth every morning.     . magnesium oxide (MAG-OX) 400 (241.3 MG) MG tablet Take 1 tablet (400 mg total) by mouth 2 (two) times daily. 180 tablet 1  . nebivolol (BYSTOLIC) 10 MG tablet Take 10 mg by mouth every morning.     .Marland Kitchen  omeprazole (PRILOSEC) 40 MG capsule Take 1 capsule (40 mg total) by mouth daily. 90 capsule 1  . oxyCODONE (ROXICODONE) 5 MG immediate release tablet Take 1 tablet (5 mg total) by mouth every 6 (six) hours as needed for severe pain. 60 tablet 0  . prochlorperazine (COMPAZINE) 10 MG  tablet Take 1 tablet (10 mg total) by mouth every 6 (six) hours as needed for nausea or vomiting. 90 tablet 1   No current facility-administered medications for this visit.   Facility-Administered Medications Ordered in Other Visits  Medication Dose Route Frequency Provider Last Rate Last Dose  . heparin lock flush 100 unit/mL  500 Units Intracatheter Once PRN Curt Bears, MD      . nivolumab (OPDIVO) 160 mg in sodium chloride 0.9 % 100 mL chemo infusion  3 mg/kg (Treatment Plan Actual) Intravenous Once Curt Bears, MD 116 mL/hr at 10/19/14 1610 160 mg at 10/19/14 1610  . sodium chloride 0.9 % injection 10 mL  10 mL Intracatheter PRN Curt Bears, MD        REVIEW OF SYSTEMS:  Constitutional: positive for fatigue Eyes: negative Ears, nose, mouth, throat, and face: negative Respiratory: negative Cardiovascular: negative Gastrointestinal: negative Genitourinary:negative Integument/breast: negative Hematologic/lymphatic: negative Musculoskeletal:negative Neurological: positive for paresthesia Behavioral/Psych: negative Endocrine: negative Allergic/Immunologic: negative   PHYSICAL EXAMINATION: General appearance: alert, cooperative and no distress Head: Normocephalic, without obvious abnormality, atraumatic Neck: no adenopathy, no JVD, supple, symmetrical, trachea midline and thyroid not enlarged, symmetric, no tenderness/mass/nodules Lymph nodes: Cervical, supraclavicular, and axillary nodes normal. Resp: clear to auscultation bilaterally Back: symmetric, no curvature. ROM normal. No CVA tenderness. Cardio: regular rate and rhythm, S1, S2 normal, no murmur, click, rub or gallop GI: soft, non-tender; bowel sounds normal; no masses,  no organomegaly Extremities: extremities normal, atraumatic, no cyanosis or edema Neurologic: Alert and oriented X 3, normal strength and tone. Normal symmetric reflexes. Normal coordination and gait  ECOG PERFORMANCE STATUS: 1 - Symptomatic  but completely ambulatory  Resp. rate 18.  LABORATORY DATA: Lab Results  Component Value Date   WBC 5.6 10/19/2014   HGB 10.1* 10/19/2014   HCT 30.8* 10/19/2014   MCV 102.3* 10/19/2014   PLT 173 10/19/2014      Chemistry      Component Value Date/Time   NA 139 10/19/2014 1319   NA 135 05/13/2014 1007   K 4.3 10/19/2014 1319   K 4.5 05/13/2014 1007   CL 96 05/13/2014 1007   CO2 24 10/19/2014 1319   CO2 29 05/13/2014 1007   BUN 19.0 10/19/2014 1319   BUN 18 05/13/2014 1007   CREATININE 1.1 10/19/2014 1319   CREATININE 1.00 05/13/2014 1007      Component Value Date/Time   CALCIUM 9.3 10/19/2014 1319   CALCIUM 9.5 05/13/2014 1007       RADIOGRAPHIC STUDIES: Ct Chest W Contrast  09/28/2014   CLINICAL DATA:  Restaging right non-small-cell lung cancer diagnosed 2014, chemotherapy in progress  EXAM: CT CHEST, ABDOMEN, AND PELVIS WITH CONTRAST  TECHNIQUE: Multidetector CT imaging of the chest, abdomen and pelvis was performed following the standard protocol during bolus administration of intravenous contrast.  CONTRAST:  188m OMNIPAQUE IOHEXOL 300 MG/ML  SOLN  COMPARISON:  07/27/2014  FINDINGS: CT CHEST FINDINGS  Mediastinum/Nodes: The heart is normal in size. No pericardial effusion.  Mild atherosclerotic calcifications of the aortic arch.  No suspicious mediastinal, hilar, or axillary lymphadenopathy.  Right chest port terminating at the cavoatrial junction.  Lungs/Pleura: 14 x 11 mm spiculated opacity in  the central right lower lobe (series 4/ image 32), unchanged. Suspected surrounding radiation changes.  Additional mucous plugging/nodularity in the right lower lobe (series 4/ images 33-34). Biapical pleural parenchymal scarring. Subpleural scarring nodule in the lateral left upper lobe likely reflects additional subpleural scarring (series 4/image 7).  Mild centrilobular emphysematous changes.  No pleural effusion or pneumothorax.  Musculoskeletal: Mild degenerative changes of the  thoracic spine.  CT ABDOMEN PELVIS FINDINGS  Hepatobiliary: Progression of hepatic metastases. While the previously described index lesions are smaller, there are additional lesions which have progressed. For example:  --4.2 x 3.5 cm lesion in segment VI (series 2/image 53), previously 3.5 x 2.7 cm  --2.9 x 2.3 cm lesion in segment II (series 2/image 47), previously 1.8 x 1.8 cm  --1.5 x 1.6 cm lesion in segment IVA (series 2/ image 46), previously 7 mm  Gallbladder is unremarkable.  Pancreas: Within normal limits.  Spleen: Within normal limits.  Adrenals/Urinary Tract: Right adrenal gland is unremarkable. Mild nodular thickening of the left adrenal gland (series 2/image 51).  Kidneys are within normal limits.  No hydronephrosis.  Bladder is unremarkable.  Stomach/Bowel: Stomach is mildly thick-walled but unremarkable.  No evidence of bowel obstruction.  Normal appendix.  Vascular/Lymphatic: Atherosclerotic calcifications of the abdominal aorta and branch vessels.  No suspicious abdominopelvic lymphadenopathy.  Reproductive: Stable appearance of the uterus, likely reflecting uterine fibroids.  No adnexal masses.  Other: No abdominopelvic ascites.  Musculoskeletal: Mild degenerative changes at L5-S1.  IMPRESSION: Stable 14 mm right lower lobe pulmonary nodule with surrounding radiation changes.  Progression of hepatic metastases.  Additional stable ancillary findings above.   Electronically Signed   By: Julian Hy M.D.   On: 09/28/2014 13:09   Ct Abdomen Pelvis W Contrast  09/28/2014   CLINICAL DATA:  Restaging right non-small-cell lung cancer diagnosed 2014, chemotherapy in progress  EXAM: CT CHEST, ABDOMEN, AND PELVIS WITH CONTRAST  TECHNIQUE: Multidetector CT imaging of the chest, abdomen and pelvis was performed following the standard protocol during bolus administration of intravenous contrast.  CONTRAST:  144m OMNIPAQUE IOHEXOL 300 MG/ML  SOLN  COMPARISON:  07/27/2014  FINDINGS: CT CHEST FINDINGS   Mediastinum/Nodes: The heart is normal in size. No pericardial effusion.  Mild atherosclerotic calcifications of the aortic arch.  No suspicious mediastinal, hilar, or axillary lymphadenopathy.  Right chest port terminating at the cavoatrial junction.  Lungs/Pleura: 14 x 11 mm spiculated opacity in the central right lower lobe (series 4/ image 32), unchanged. Suspected surrounding radiation changes.  Additional mucous plugging/nodularity in the right lower lobe (series 4/ images 33-34). Biapical pleural parenchymal scarring. Subpleural scarring nodule in the lateral left upper lobe likely reflects additional subpleural scarring (series 4/image 7).  Mild centrilobular emphysematous changes.  No pleural effusion or pneumothorax.  Musculoskeletal: Mild degenerative changes of the thoracic spine.  CT ABDOMEN PELVIS FINDINGS  Hepatobiliary: Progression of hepatic metastases. While the previously described index lesions are smaller, there are additional lesions which have progressed. For example:  --4.2 x 3.5 cm lesion in segment VI (series 2/image 53), previously 3.5 x 2.7 cm  --2.9 x 2.3 cm lesion in segment II (series 2/image 47), previously 1.8 x 1.8 cm  --1.5 x 1.6 cm lesion in segment IVA (series 2/ image 46), previously 7 mm  Gallbladder is unremarkable.  Pancreas: Within normal limits.  Spleen: Within normal limits.  Adrenals/Urinary Tract: Right adrenal gland is unremarkable. Mild nodular thickening of the left adrenal gland (series 2/image 51).  Kidneys are within normal limits.  No hydronephrosis.  Bladder is unremarkable.  Stomach/Bowel: Stomach is mildly thick-walled but unremarkable.  No evidence of bowel obstruction.  Normal appendix.  Vascular/Lymphatic: Atherosclerotic calcifications of the abdominal aorta and branch vessels.  No suspicious abdominopelvic lymphadenopathy.  Reproductive: Stable appearance of the uterus, likely reflecting uterine fibroids.  No adnexal masses.  Other: No abdominopelvic  ascites.  Musculoskeletal: Mild degenerative changes at L5-S1.  IMPRESSION: Stable 14 mm right lower lobe pulmonary nodule with surrounding radiation changes.  Progression of hepatic metastases.  Additional stable ancillary findings above.   Electronically Signed   By: Julian Hy M.D.   On: 09/28/2014 13:09    ASSESSMENT AND PLAN: This is a very pleasant 64 years old white female recently diagnosed with metastatic non-small cell lung cancer, adenocarcinoma presented with right lower lobe lung mass in addition to mediastinal and supraclavicular lymphadenopathy as well as diffuse metastatic liver lesions and retroperitoneal lymphadenopathy. She underwent 3 cycles of systemic chemotherapy with carboplatin and Alimta but unfortunately has evidence for disease progression especially in the liver and new small left adrenal metastasis. She is currently undergoing second line chemotherapy with cisplatin and gemcitabine status post 12 cycles and tolerating it fairly well patient after reducing the dose of cisplatin. This treatment was discontinued after the patient developed hypersensitivity reaction to cisplatin. The patient also completed 3 cycles of chemotherapy with single agent gemcitabine but unfortunately the recent CT scan of the chest, abdomen and pelvis showed evidence for disease progression especially in the liver. She is now being treated with immunotherapy with single agent Nivolumab 3 MG/KG every 2 weeks. She is status post 1 cycle. She tolerated the first cycle without any difficulty. The patient was discussed with and also seen by Dr. Julien Nordmann. In the interim we will like her to stand or magnesium supplement and we will check a serum magnesium when she returns in 2 weeks for another symptom management visit prior to cycle #3 of her immunotherapy with Nivolumab.   The patient was discussed with and also seen by Dr. Julien Nordmann.  She was advised to call immediately if she has any concerning  symptoms in the interval. The patient voices understanding of current disease status and treatment options and is in agreement with the current care plan.  All questions were answered. The patient knows to call the clinic with any problems, questions or concerns. We can certainly see the patient much sooner if necessary.  Carlton Adam, PA-C 10/19/2014  ADDENDUM: Hematology/Oncology Attending: I had a face to face encounter with the patient. I recommended her care plan. This is a very pleasant 64 years old white female with metastatic non-small cell lung cancer, adenocarcinoma currently undergoing treatment with immunotherapy with Nivolumab status post 1 cycle and tolerated the first cycle of her treatment fairly well with no significant adverse effects. The patient just came from a vacation in the [place] and enjoyed her time there. She denied having any significant nausea or vomiting, no diarrhea. I recommended for the patient to proceed with cycle #2 today as a scheduled. She will come back for follow-up visit in 2 weeks with the next cycle of her treatment. She was advised to call immediately if she has any concerning symptoms in the interval.  Disclaimer: This note was dictated with voice recognition software. Similar sounding words can inadvertently be transcribed and may be missed upon review. Eilleen Kempf., MD 10/23/2014

## 2014-10-19 NOTE — Patient Instructions (Signed)

## 2014-10-20 ENCOUNTER — Telehealth: Payer: Self-pay | Admitting: *Deleted

## 2014-10-20 NOTE — Telephone Encounter (Signed)
Per staff message and POF I have scheduled appts. Advised scheduler of appts. JMW  

## 2014-10-21 ENCOUNTER — Other Ambulatory Visit: Payer: 59

## 2014-10-21 ENCOUNTER — Ambulatory Visit: Payer: 59 | Admitting: Physician Assistant

## 2014-10-22 NOTE — Patient Instructions (Signed)
Follow-up in 2 weeks

## 2014-11-02 ENCOUNTER — Ambulatory Visit: Payer: 59

## 2014-11-02 ENCOUNTER — Ambulatory Visit (HOSPITAL_BASED_OUTPATIENT_CLINIC_OR_DEPARTMENT_OTHER): Payer: 59 | Admitting: Nurse Practitioner

## 2014-11-02 ENCOUNTER — Ambulatory Visit (HOSPITAL_BASED_OUTPATIENT_CLINIC_OR_DEPARTMENT_OTHER): Payer: 59

## 2014-11-02 ENCOUNTER — Other Ambulatory Visit (HOSPITAL_BASED_OUTPATIENT_CLINIC_OR_DEPARTMENT_OTHER): Payer: 59

## 2014-11-02 ENCOUNTER — Other Ambulatory Visit: Payer: Self-pay | Admitting: Oncology

## 2014-11-02 VITALS — BP 120/54 | HR 70 | Temp 97.8°F | Resp 18 | Ht 62.0 in | Wt 117.9 lb

## 2014-11-02 DIAGNOSIS — C787 Secondary malignant neoplasm of liver and intrahepatic bile duct: Secondary | ICD-10-CM

## 2014-11-02 DIAGNOSIS — C3491 Malignant neoplasm of unspecified part of right bronchus or lung: Secondary | ICD-10-CM

## 2014-11-02 DIAGNOSIS — Z5112 Encounter for antineoplastic immunotherapy: Secondary | ICD-10-CM

## 2014-11-02 DIAGNOSIS — Z95828 Presence of other vascular implants and grafts: Secondary | ICD-10-CM

## 2014-11-02 DIAGNOSIS — C3431 Malignant neoplasm of lower lobe, right bronchus or lung: Secondary | ICD-10-CM

## 2014-11-02 DIAGNOSIS — Z79899 Other long term (current) drug therapy: Secondary | ICD-10-CM

## 2014-11-02 LAB — COMPREHENSIVE METABOLIC PANEL (CC13)
ALK PHOS: 229 U/L — AB (ref 40–150)
ALT: 22 U/L (ref 0–55)
ANION GAP: 8 meq/L (ref 3–11)
AST: 35 U/L — ABNORMAL HIGH (ref 5–34)
Albumin: 3.2 g/dL — ABNORMAL LOW (ref 3.5–5.0)
BILIRUBIN TOTAL: 0.36 mg/dL (ref 0.20–1.20)
BUN: 16.2 mg/dL (ref 7.0–26.0)
CO2: 26 mEq/L (ref 22–29)
Calcium: 9.5 mg/dL (ref 8.4–10.4)
Chloride: 101 mEq/L (ref 98–109)
Creatinine: 1 mg/dL (ref 0.6–1.1)
EGFR: 58 mL/min/{1.73_m2} — AB (ref >90)
Glucose: 130 mg/dl (ref 70–140)
Potassium: 4.3 mEq/L (ref 3.5–5.1)
SODIUM: 135 meq/L — AB (ref 136–145)
Total Protein: 6.4 g/dL (ref 6.4–8.3)

## 2014-11-02 LAB — CBC WITH DIFFERENTIAL/PLATELET
BASO%: 0.2 % (ref 0.0–2.0)
Basophils Absolute: 0 10*3/uL (ref 0.0–0.1)
EOS%: 0.5 % (ref 0.0–7.0)
Eosinophils Absolute: 0 10*3/uL (ref 0.0–0.5)
HCT: 31.3 % — ABNORMAL LOW (ref 34.8–46.6)
HGB: 10.4 g/dL — ABNORMAL LOW (ref 11.6–15.9)
LYMPH%: 24.6 % (ref 14.0–49.7)
MCH: 33.1 pg (ref 25.1–34.0)
MCHC: 33.2 g/dL (ref 31.5–36.0)
MCV: 99.7 fL (ref 79.5–101.0)
MONO#: 0.5 10*3/uL (ref 0.1–0.9)
MONO%: 9.6 % (ref 0.0–14.0)
NEUT%: 65.1 % (ref 38.4–76.8)
NEUTROS ABS: 3.7 10*3/uL (ref 1.5–6.5)
Platelets: 176 10*3/uL (ref 145–400)
RBC: 3.14 10*6/uL — AB (ref 3.70–5.45)
RDW: 13.8 % (ref 11.2–14.5)
WBC: 5.6 10*3/uL (ref 3.9–10.3)
lymph#: 1.4 10*3/uL (ref 0.9–3.3)

## 2014-11-02 LAB — MAGNESIUM (CC13): Magnesium: 2.1 mg/dl (ref 1.5–2.5)

## 2014-11-02 MED ORDER — SODIUM CHLORIDE 0.9 % IJ SOLN
10.0000 mL | INTRAMUSCULAR | Status: DC | PRN
  Administered 2014-11-02: 10 mL
  Filled 2014-11-02: qty 10

## 2014-11-02 MED ORDER — SODIUM CHLORIDE 0.9 % IV SOLN
3.0000 mg/kg | Freq: Once | INTRAVENOUS | Status: AC
  Administered 2014-11-02: 160 mg via INTRAVENOUS
  Filled 2014-11-02: qty 16

## 2014-11-02 MED ORDER — SODIUM CHLORIDE 0.9 % IV SOLN
Freq: Once | INTRAVENOUS | Status: AC
  Administered 2014-11-02: 15:00:00 via INTRAVENOUS

## 2014-11-02 MED ORDER — SODIUM CHLORIDE 0.9 % IJ SOLN
10.0000 mL | INTRAMUSCULAR | Status: DC | PRN
  Administered 2014-11-02: 10 mL via INTRAVENOUS
  Filled 2014-11-02: qty 10

## 2014-11-02 MED ORDER — HEPARIN SOD (PORK) LOCK FLUSH 100 UNIT/ML IV SOLN
500.0000 [IU] | Freq: Once | INTRAVENOUS | Status: AC | PRN
  Administered 2014-11-02: 500 [IU]
  Filled 2014-11-02: qty 5

## 2014-11-02 NOTE — Patient Instructions (Signed)
[facility] [facility] Discharge Instructions for Patients Receiving Chemotherapy  Today you received the following chemotherapy agents Nivolumab   To help prevent nausea and vomiting after your treatment, we encourage you to take your nausea medication Compazine 10 mg every 6 hours as needed.  If you develop nausea and vomiting that is not controlled by your nausea medication, call the clinic.   BELOW ARE SYMPTOMS THAT SHOULD BE REPORTED IMMEDIATELY:  *FEVER GREATER THAN 100.5 F  *CHILLS WITH OR WITHOUT FEVER  NAUSEA AND VOMITING THAT IS NOT CONTROLLED WITH YOUR NAUSEA MEDICATION  *UNUSUAL SHORTNESS OF BREATH  *UNUSUAL BRUISING OR BLEEDING  TENDERNESS IN MOUTH AND THROAT WITH OR WITHOUT PRESENCE OF ULCERS  *URINARY PROBLEMS  *BOWEL PROBLEMS  UNUSUAL RASH Items with * indicate a potential emergency and should be followed up as soon as possible.  Feel free to call the clinic you have any questions or concerns. The clinic phone number is [phone].

## 2014-11-02 NOTE — Patient Instructions (Signed)

## 2014-11-02 NOTE — Progress Notes (Signed)
  [facility] OFFICE PROGRESS NOTE   Diagnosis:  Stage IV (T1b, N2, M1b) non-small cell lung cancer, adenocarcinoma, presented with a right lower lobe lung mass as well as mediastinal, hilar, bilateral supraclavicular lymphadenopathy in addition to extensive liver metastases and retroperitoneal lymphadenopathy diagnosed in December of 2014.  INTERVAL HISTORY:   Amber Hayden returns as scheduled. She completed cycle 2 nivolumab on 10/19/2014. She feels she is tolerating treatment well. No skin rash. No diarrhea. She has mild intermittent nausea. No vomiting. No mouth sores. She denies shortness of breath. She has a chronic cough that is unchanged. She has a good appetite. She is gaining weight.  Objective:  Vital signs in last 24 hours:  Blood pressure 120/54, pulse 70, temperature 97.8 F (36.6 C), temperature source Oral, resp. rate 18, height 5\' 2"  (1.575 m), weight 117 lb 14.4 oz (53.479 kg), SpO2 98 %.    HEENT: No thrush or ulcers. Lymphatics: No palpable cervical or supraclavicular lymph nodes. Resp: Lungs clear bilaterally. Cardio: Regular rate and rhythm. GI: Abdomen soft and nontender. Liver edge is palpable. Vascular: No leg edema. Calves soft and nontender. Neuro: Alert and oriented. Decreased hearing.  Skin: No rash. Port-A-Cath without erythema.    Lab Results:  Lab Results  Component Value Date   WBC 5.6 11/02/2014   HGB 10.4* 11/02/2014   HCT 31.3* 11/02/2014   MCV 99.7 11/02/2014   PLT 176 11/02/2014   NEUTROABS 3.7 11/02/2014    Imaging:  No results found.  Medications: I have reviewed the patient's current medications.  Assessment/Plan: 1. Metastatic non-small cell lung cancer treated in the past with carboplatin/Alimta with progression;  cisplatin and gemcitabine 12 cycles with treatment discontinued due to a hypersensitivity reaction to cisplatin; gemcitabine 3 cycles with disease progression; initiation of nivolumab  10/05/2014.   Disposition: Amber Hayden appears stable. She has completed 2 cycles of nivolumab. She is tolerating treatment well. Plan to proceed with cycle 3 today as scheduled. She will return for a follow-up visit and cycle 4 in 2 weeks. She will contact the office in the interim with any problems.    Ned Card ANP/GNP-BC   11/02/2014  2:44 PM

## 2014-11-03 LAB — TSH CHCC: TSH: 0.964 m(IU)/L (ref 0.308–3.960)

## 2014-11-16 ENCOUNTER — Telehealth: Payer: Self-pay | Admitting: Internal Medicine

## 2014-11-16 ENCOUNTER — Ambulatory Visit: Payer: 59

## 2014-11-16 ENCOUNTER — Encounter: Payer: Self-pay | Admitting: Internal Medicine

## 2014-11-16 ENCOUNTER — Other Ambulatory Visit (HOSPITAL_BASED_OUTPATIENT_CLINIC_OR_DEPARTMENT_OTHER): Payer: 59

## 2014-11-16 ENCOUNTER — Ambulatory Visit (HOSPITAL_BASED_OUTPATIENT_CLINIC_OR_DEPARTMENT_OTHER): Payer: 59 | Admitting: Internal Medicine

## 2014-11-16 ENCOUNTER — Ambulatory Visit (HOSPITAL_BASED_OUTPATIENT_CLINIC_OR_DEPARTMENT_OTHER): Payer: 59

## 2014-11-16 VITALS — BP 120/61 | HR 63 | Temp 98.5°F | Resp 17 | Ht 62.0 in | Wt 115.6 lb

## 2014-11-16 DIAGNOSIS — C3431 Malignant neoplasm of lower lobe, right bronchus or lung: Secondary | ICD-10-CM

## 2014-11-16 DIAGNOSIS — C3491 Malignant neoplasm of unspecified part of right bronchus or lung: Secondary | ICD-10-CM

## 2014-11-16 DIAGNOSIS — C7972 Secondary malignant neoplasm of left adrenal gland: Secondary | ICD-10-CM

## 2014-11-16 DIAGNOSIS — Z95828 Presence of other vascular implants and grafts: Secondary | ICD-10-CM

## 2014-11-16 DIAGNOSIS — C787 Secondary malignant neoplasm of liver and intrahepatic bile duct: Secondary | ICD-10-CM

## 2014-11-16 DIAGNOSIS — G620 Drug-induced polyneuropathy: Secondary | ICD-10-CM

## 2014-11-16 DIAGNOSIS — Z5112 Encounter for antineoplastic immunotherapy: Secondary | ICD-10-CM

## 2014-11-16 DIAGNOSIS — T451X5A Adverse effect of antineoplastic and immunosuppressive drugs, initial encounter: Secondary | ICD-10-CM

## 2014-11-16 DIAGNOSIS — D6481 Anemia due to antineoplastic chemotherapy: Secondary | ICD-10-CM

## 2014-11-16 LAB — CBC WITH DIFFERENTIAL/PLATELET
BASO%: 0.3 % (ref 0.0–2.0)
Basophils Absolute: 0 10*3/uL (ref 0.0–0.1)
EOS ABS: 0 10*3/uL (ref 0.0–0.5)
EOS%: 0.5 % (ref 0.0–7.0)
HCT: 33.3 % — ABNORMAL LOW (ref 34.8–46.6)
HEMOGLOBIN: 10.9 g/dL — AB (ref 11.6–15.9)
LYMPH%: 25.6 % (ref 14.0–49.7)
MCH: 31.6 pg (ref 25.1–34.0)
MCHC: 32.8 g/dL (ref 31.5–36.0)
MCV: 96.5 fL (ref 79.5–101.0)
MONO#: 0.5 10*3/uL (ref 0.1–0.9)
MONO%: 8.5 % (ref 0.0–14.0)
NEUT#: 3.9 10*3/uL (ref 1.5–6.5)
NEUT%: 65.1 % (ref 38.4–76.8)
PLATELETS: 224 10*3/uL (ref 145–400)
RBC: 3.45 10*6/uL — ABNORMAL LOW (ref 3.70–5.45)
RDW: 14.5 % (ref 11.2–14.5)
WBC: 6 10*3/uL (ref 3.9–10.3)
lymph#: 1.5 10*3/uL (ref 0.9–3.3)

## 2014-11-16 LAB — COMPREHENSIVE METABOLIC PANEL (CC13)
ALBUMIN: 3.2 g/dL — AB (ref 3.5–5.0)
ALT: 29 U/L (ref 0–55)
AST: 41 U/L — AB (ref 5–34)
Alkaline Phosphatase: 239 U/L — ABNORMAL HIGH (ref 40–150)
Anion Gap: 12 mEq/L — ABNORMAL HIGH (ref 3–11)
BUN: 23.2 mg/dL (ref 7.0–26.0)
CALCIUM: 9.7 mg/dL (ref 8.4–10.4)
CHLORIDE: 104 meq/L (ref 98–109)
CO2: 23 mEq/L (ref 22–29)
Creatinine: 1 mg/dL (ref 0.6–1.1)
EGFR: 58 mL/min/{1.73_m2} — AB (ref >90)
GLUCOSE: 96 mg/dL (ref 70–140)
POTASSIUM: 4.5 meq/L (ref 3.5–5.1)
Sodium: 139 mEq/L (ref 136–145)
Total Bilirubin: 0.36 mg/dL (ref 0.20–1.20)
Total Protein: 6.8 g/dL (ref 6.4–8.3)

## 2014-11-16 LAB — MAGNESIUM (CC13): Magnesium: 2.1 mg/dl (ref 1.5–2.5)

## 2014-11-16 MED ORDER — HEPARIN SOD (PORK) LOCK FLUSH 100 UNIT/ML IV SOLN
500.0000 [IU] | Freq: Once | INTRAVENOUS | Status: AC | PRN
  Administered 2014-11-16: 500 [IU]
  Filled 2014-11-16: qty 5

## 2014-11-16 MED ORDER — SODIUM CHLORIDE 0.9 % IJ SOLN
10.0000 mL | INTRAMUSCULAR | Status: DC | PRN
  Administered 2014-11-16: 10 mL via INTRAVENOUS
  Filled 2014-11-16: qty 10

## 2014-11-16 MED ORDER — SODIUM CHLORIDE 0.9 % IV SOLN
Freq: Once | INTRAVENOUS | Status: AC
  Administered 2014-11-16: 15:00:00 via INTRAVENOUS

## 2014-11-16 MED ORDER — SODIUM CHLORIDE 0.9 % IV SOLN
3.0000 mg/kg | Freq: Once | INTRAVENOUS | Status: AC
  Administered 2014-11-16: 160 mg via INTRAVENOUS
  Filled 2014-11-16: qty 16

## 2014-11-16 MED ORDER — SODIUM CHLORIDE 0.9 % IJ SOLN
10.0000 mL | INTRAMUSCULAR | Status: AC | PRN
  Administered 2014-11-16: 10 mL
  Filled 2014-11-16: qty 10

## 2014-11-16 NOTE — Patient Instructions (Signed)
[facility] Discharge Instructions   Today you received the following: Nivolumab  To help prevent nausea and vomiting after your treatment, we encourage you to take your nausea medication as directed.    If you develop nausea and vomiting that is not controlled by your nausea medication, call the clinic.   BELOW ARE SYMPTOMS THAT SHOULD BE REPORTED IMMEDIATELY:  *FEVER GREATER THAN 100.5 F  *CHILLS WITH OR WITHOUT FEVER  NAUSEA AND VOMITING THAT IS NOT CONTROLLED WITH YOUR NAUSEA MEDICATION  *UNUSUAL SHORTNESS OF BREATH  *UNUSUAL BRUISING OR BLEEDING  TENDERNESS IN MOUTH AND THROAT WITH OR WITHOUT PRESENCE OF ULCERS  *URINARY PROBLEMS  *BOWEL PROBLEMS  UNUSUAL RASH Items with * indicate a potential emergency and should be followed up as soon as possible.  Feel free to call the clinic you have any questions or concerns. The clinic phone number is [phone].  Please show the [facility] at check-in to the Emergency Department and triage nurse.

## 2014-11-16 NOTE — Patient Instructions (Signed)

## 2014-11-16 NOTE — Progress Notes (Signed)
[place] [phone]   [phone]  OFFICE PROGRESS NOTE  Mathews Argyle, MD [address] [place] [address]  DIAGNOSIS: Stage IV (T1b, N2, M1b) non-small cell lung cancer, adenocarcinoma, presented with a right lower lobe lung mass as well as mediastinal, hilar, bilateral supraclavicular lymphadenopathy in addition to extensive liver metastases and retroperitoneal lymphadenopathy diagnosed in December of 2014.  MOLECULAR BIOMARKERS: [facility] biomarker testing revealed TP53 E204*, KEAP1 R648f*25+, NFKBIA amplification, NKX2-1 amplification, SMARCA4 E463*. The patient was negative for EGFR and ALK gene translocation   PRIOR THERAPY:  1) Systemic chemotherapy with carboplatin for AUC of 5 and Alimta 500 mg/M2 every 3 weeks, status post 3 cycles. First dose 09/02/2013.  2)  Systemic chemotherapy with cisplatin 60 mg/M2 on days 1 and gemcitabine 800 mg/M2 on days 1 and 8 every 3 weeks, first cycle on 11/12/2013. Status post 12 cycles. Discontinued secondary to hypersensitivity reaction to cisplatin during cycle #12. 3) Systemic chemotherapy with gemcitabine 1000 mg/M2 on days 1 and 8 every 3 weeks, first cycle on 07/29/2014. Status post 3 cycles discontinued today secondary to disease progression.  CURRENT THERAPY: Immunotherapy with Nivolumab 3 MG/KG every 2 weeks. First cycle 10/05/2014. Status post 3 cycles  CHEMOTHERAPY INTENT: palliative  CURRENT # OF CHEMOTHERAPY CYCLES: 4  CURRENT ANTIEMETICS: Zofran, dexamethasone and Compazine.  CURRENT SMOKING STATUS: former smoker  ORAL CHEMOTHERAPY AND CONSENT: None  CURRENT BISPHOSPHONATES USE: None  PAIN MANAGEMENT: 0/10  NARCOTICS INDUCED CONSTIPATION: None  LIVING WILL AND CODE STATUS: Full Code  INTERVAL HISTORY: Amber [id number].o. female returns to the clinic today for follow up visit accompanied by her husband. The patient is feeling fine today but still  complaining of mild fatigue and mild right upper quadrant abdominal pain started after an episode of cough. She is tolerating her current treatment with Nivolumab fairly well was no significant adverse effects. She denied having any skin rash or diarrhea. She noticed improvement in her peripheral neuropathy over the last few weeks. The patient denied having any significant nausea or vomiting. She denied having any fever or chills. She has no significant weight loss or night sweats. She denied having any significant chest pain, shortness of breath, cough or hemoptysis.   MEDICAL HISTORY: Past Medical History  Diagnosis Date  . HTN (hypertension)   . lung ca dx'd 07/2013    ALLERGIES:  is allergic to cisplatin; hctz; and plendil.  MEDICATIONS:  Current Outpatient Prescriptions  Medication Sig Dispense Refill  . amLODipine (NORVASC) 10 MG tablet Take 10 mg by mouth every morning.     .Marland Kitchenaspirin EC 81 MG tablet Take 81 mg by mouth every morning.     . calcium carbonate (TUMS - DOSED IN MG ELEMENTAL CALCIUM) 500 MG chewable tablet Chew 1 tablet by mouth 3 (three) times daily as needed for indigestion or heartburn.     . DULoxetine (CYMBALTA) 30 MG capsule Take 1 capsule (30 mg total) by mouth daily. 90 capsule 1  . lidocaine-prilocaine (EMLA) cream APPLY 1 APPLICATION TOPICALLY AS NEEDED. APPLY TO PORT 1 HOUR BEFORE CHEMO APPOINTMENT 30 g 0  . lisinopril (PRINIVIL,ZESTRIL) 20 MG tablet Take 20 mg by mouth every morning.     . magnesium oxide (MAG-OX) 400 (241.3 MG) MG tablet Take 1 tablet (400 mg total) by mouth 2 (two) times daily. 180 tablet 1  . nebivolol (BYSTOLIC) 10 MG tablet Take 10 mg by mouth every morning.     .Marland Kitchen  omeprazole (PRILOSEC) 40 MG capsule Take 1 capsule (40 mg total) by mouth daily. 90 capsule 1  . oxyCODONE (ROXICODONE) 5 MG immediate release tablet Take 1 tablet (5 mg total) by mouth every 6 (six) hours as needed for severe pain. (Patient not taking: Reported on 11/02/2014) 60  tablet 0  . prochlorperazine (COMPAZINE) 10 MG tablet Take 1 tablet (10 mg total) by mouth every 6 (six) hours as needed for nausea or vomiting. 90 tablet 1   No current facility-administered medications for this visit.    REVIEW OF SYSTEMS:  Constitutional: positive for fatigue Eyes: negative Ears, nose, mouth, throat, and face: negative Respiratory: negative Cardiovascular: negative Gastrointestinal: negative Genitourinary:negative Integument/breast: negative Hematologic/lymphatic: negative Musculoskeletal:negative Neurological: positive for paresthesia Behavioral/Psych: negative Endocrine: negative Allergic/Immunologic: negative   PHYSICAL EXAMINATION: General appearance: alert, cooperative and no distress Head: Normocephalic, without obvious abnormality, atraumatic Neck: no adenopathy, no JVD, supple, symmetrical, trachea midline and thyroid not enlarged, symmetric, no tenderness/mass/nodules Lymph nodes: Cervical, supraclavicular, and axillary nodes normal. Resp: clear to auscultation bilaterally Back: symmetric, no curvature. ROM normal. No CVA tenderness. Cardio: regular rate and rhythm, S1, S2 normal, no murmur, click, rub or gallop GI: soft, non-tender; bowel sounds normal; no masses,  no organomegaly Extremities: extremities normal, atraumatic, no cyanosis or edema Neurologic: Alert and oriented X 3, normal strength and tone. Normal symmetric reflexes. Normal coordination and gait  ECOG PERFORMANCE STATUS: 1 - Symptomatic but completely ambulatory  Blood pressure 120/61, pulse 63, temperature 98.5 F (36.9 C), temperature source Oral, resp. rate 17, height 5' 2"  (1.575 m), weight 115 lb 9.6 oz (52.436 kg), SpO2 98 %.  LABORATORY DATA: Lab Results  Component Value Date   WBC 6.0 11/16/2014   HGB 10.9* 11/16/2014   HCT 33.3* 11/16/2014   MCV 96.5 11/16/2014   PLT 224 11/16/2014      Chemistry      Component Value Date/Time   NA 139 11/16/2014 1241   NA 135  05/13/2014 1007   K 4.5 11/16/2014 1241   K 4.5 05/13/2014 1007   CL 96 05/13/2014 1007   CO2 23 11/16/2014 1241   CO2 29 05/13/2014 1007   BUN 23.2 11/16/2014 1241   BUN 18 05/13/2014 1007   CREATININE 1.0 11/16/2014 1241   CREATININE 1.00 05/13/2014 1007      Component Value Date/Time   CALCIUM 9.7 11/16/2014 1241   CALCIUM 9.5 05/13/2014 1007       RADIOGRAPHIC STUDIES: No results found. ASSESSMENT AND PLAN: This is a very pleasant 64 years old white female recently diagnosed with metastatic non-small cell lung cancer, adenocarcinoma presented with right lower lobe lung mass in addition to mediastinal and supraclavicular lymphadenopathy as well as diffuse metastatic liver lesions and retroperitoneal lymphadenopathy. She underwent 3 cycles of systemic chemotherapy with carboplatin and Alimta but unfortunately has evidence for disease progression especially in the liver and new small left adrenal metastasis. She is currently undergoing second line chemotherapy with cisplatin and gemcitabine status post 12 cycles and tolerating it fairly well patient after reducing the dose of cisplatin. This treatment was discontinued after the patient developed hypersensitivity reaction to cisplatin. The patient also completed 3 cycles of chemotherapy with single agent gemcitabine but unfortunately the recent CT scan of the chest, abdomen and pelvis showed evidence for disease progression especially in the liver. The patient is currently on immunotherapy with single agent Nivolumab 3 MG/KG every 2 weeks is status post 3 cycles. She is tolerating her treatment well. We will proceed  with cycle #4 today as a scheduled. The patient will come back for follow-up visit in 2 weeks for reevaluation after repeating CT scan of the chest, abdomen and pelvis for restaging of her disease. She was advised to call immediately if she has any concerning symptoms in the interval. The patient voices understanding of current  disease status and treatment options and is in agreement with the current care plan.  All questions were answered. The patient knows to call the clinic with any problems, questions or concerns. We can certainly see the patient much sooner if necessary.  Disclaimer: This note was dictated with voice recognition software. Similar sounding words can inadvertently be transcribed and may not be corrected upon review.

## 2014-11-16 NOTE — Telephone Encounter (Signed)
gave and printed appt sched and avs for pt for April....sed added tx.

## 2014-11-16 NOTE — Progress Notes (Unsigned)
Dr. Julien Nordmann aware of CMET results, okay to proceed with treatment today.

## 2014-11-17 ENCOUNTER — Telehealth: Payer: Self-pay | Admitting: Internal Medicine

## 2014-11-17 NOTE — Telephone Encounter (Signed)
returned call and spoke with pt and confirmed that I left Barium with Amber Hayden at front desk....pt ok and aware

## 2014-11-29 ENCOUNTER — Encounter (HOSPITAL_COMMUNITY): Payer: Self-pay

## 2014-11-29 ENCOUNTER — Ambulatory Visit (HOSPITAL_COMMUNITY)
Admission: RE | Admit: 2014-11-29 | Discharge: 2014-11-29 | Disposition: A | Discharge status: Home / Self Care | Payer: 59 | Source: Ambulatory Visit | Attending: Internal Medicine | Admitting: Internal Medicine

## 2014-11-29 DIAGNOSIS — C3491 Malignant neoplasm of unspecified part of right bronchus or lung: Secondary | ICD-10-CM

## 2014-11-29 DIAGNOSIS — T451X5A Adverse effect of antineoplastic and immunosuppressive drugs, initial encounter: Secondary | ICD-10-CM | POA: Diagnosis not present

## 2014-11-29 DIAGNOSIS — R05 Cough: Secondary | ICD-10-CM | POA: Insufficient documentation

## 2014-11-29 DIAGNOSIS — G620 Drug-induced polyneuropathy: Secondary | ICD-10-CM

## 2014-11-29 DIAGNOSIS — Z720 Tobacco use: Secondary | ICD-10-CM | POA: Insufficient documentation

## 2014-11-29 DIAGNOSIS — Z79899 Other long term (current) drug therapy: Secondary | ICD-10-CM | POA: Diagnosis not present

## 2014-11-29 DIAGNOSIS — D6481 Anemia due to antineoplastic chemotherapy: Secondary | ICD-10-CM

## 2014-11-29 MED ORDER — IOHEXOL 300 MG/ML  SOLN
100.0000 mL | Freq: Once | INTRAMUSCULAR | Status: AC | PRN
  Administered 2014-11-29: 100 mL via INTRAVENOUS

## 2014-11-30 ENCOUNTER — Telehealth: Payer: Self-pay | Admitting: *Deleted

## 2014-11-30 ENCOUNTER — Ambulatory Visit (HOSPITAL_BASED_OUTPATIENT_CLINIC_OR_DEPARTMENT_OTHER): Payer: 59 | Admitting: Physician Assistant

## 2014-11-30 ENCOUNTER — Ambulatory Visit: Payer: 59

## 2014-11-30 ENCOUNTER — Telehealth: Payer: Self-pay | Admitting: Physician Assistant

## 2014-11-30 ENCOUNTER — Other Ambulatory Visit (HOSPITAL_BASED_OUTPATIENT_CLINIC_OR_DEPARTMENT_OTHER): Payer: 59

## 2014-11-30 ENCOUNTER — Encounter: Payer: Self-pay | Admitting: Physician Assistant

## 2014-11-30 VITALS — BP 103/82 | HR 60 | Temp 97.9°F | Resp 16 | Wt 120.9 lb

## 2014-11-30 DIAGNOSIS — Z79899 Other long term (current) drug therapy: Secondary | ICD-10-CM | POA: Diagnosis not present

## 2014-11-30 DIAGNOSIS — C3491 Malignant neoplasm of unspecified part of right bronchus or lung: Secondary | ICD-10-CM

## 2014-11-30 DIAGNOSIS — C3431 Malignant neoplasm of lower lobe, right bronchus or lung: Secondary | ICD-10-CM

## 2014-11-30 DIAGNOSIS — C7972 Secondary malignant neoplasm of left adrenal gland: Secondary | ICD-10-CM

## 2014-11-30 DIAGNOSIS — C787 Secondary malignant neoplasm of liver and intrahepatic bile duct: Secondary | ICD-10-CM | POA: Diagnosis not present

## 2014-11-30 DIAGNOSIS — Z95828 Presence of other vascular implants and grafts: Secondary | ICD-10-CM

## 2014-11-30 DIAGNOSIS — C778 Secondary and unspecified malignant neoplasm of lymph nodes of multiple regions: Secondary | ICD-10-CM

## 2014-11-30 LAB — CBC WITH DIFFERENTIAL/PLATELET
BASO%: 0.1 % (ref 0.0–2.0)
BASOS ABS: 0 10*3/uL (ref 0.0–0.1)
EOS%: 0.4 % (ref 0.0–7.0)
Eosinophils Absolute: 0 10*3/uL (ref 0.0–0.5)
HCT: 33 % — ABNORMAL LOW (ref 34.8–46.6)
HEMOGLOBIN: 11 g/dL — AB (ref 11.6–15.9)
LYMPH#: 1.7 10*3/uL (ref 0.9–3.3)
LYMPH%: 25.8 % (ref 14.0–49.7)
MCH: 31.8 pg (ref 25.1–34.0)
MCHC: 33.3 g/dL (ref 31.5–36.0)
MCV: 95.4 fL (ref 79.5–101.0)
MONO#: 0.7 10*3/uL (ref 0.1–0.9)
MONO%: 10.4 % (ref 0.0–14.0)
NEUT#: 4.2 10*3/uL (ref 1.5–6.5)
NEUT%: 63.3 % (ref 38.4–76.8)
PLATELETS: 204 10*3/uL (ref 145–400)
RBC: 3.46 10*6/uL — ABNORMAL LOW (ref 3.70–5.45)
RDW: 13.9 % (ref 11.2–14.5)
WBC: 6.7 10*3/uL (ref 3.9–10.3)

## 2014-11-30 LAB — COMPREHENSIVE METABOLIC PANEL (CC13)
ALBUMIN: 3.2 g/dL — AB (ref 3.5–5.0)
ALT: 32 U/L (ref 0–55)
AST: 48 U/L — AB (ref 5–34)
Alkaline Phosphatase: 254 U/L — ABNORMAL HIGH (ref 40–150)
Anion Gap: 11 mEq/L (ref 3–11)
BUN: 16.1 mg/dL (ref 7.0–26.0)
CO2: 23 mEq/L (ref 22–29)
Calcium: 9.3 mg/dL (ref 8.4–10.4)
Chloride: 102 mEq/L (ref 98–109)
Creatinine: 0.9 mg/dL (ref 0.6–1.1)
EGFR: 65 mL/min/{1.73_m2} — AB (ref >90)
GLUCOSE: 91 mg/dL (ref 70–140)
POTASSIUM: 4.2 meq/L (ref 3.5–5.1)
SODIUM: 137 meq/L (ref 136–145)
Total Bilirubin: 0.45 mg/dL (ref 0.20–1.20)
Total Protein: 6.7 g/dL (ref 6.4–8.3)

## 2014-11-30 LAB — TSH CHCC: TSH: 2.044 m(IU)/L (ref 0.308–3.960)

## 2014-11-30 MED ORDER — HEPARIN SOD (PORK) LOCK FLUSH 100 UNIT/ML IV SOLN
500.0000 [IU] | Freq: Once | INTRAVENOUS | Status: AC
  Administered 2014-11-30: 500 [IU] via INTRAVENOUS
  Filled 2014-11-30: qty 5

## 2014-11-30 MED ORDER — LORATADINE 10 MG PO TABS: ORAL_TABLET | ORAL | Status: AC

## 2014-11-30 MED ORDER — SODIUM CHLORIDE 0.9 % IJ SOLN
10.0000 mL | INTRAMUSCULAR | Status: DC | PRN
  Administered 2014-11-30: 10 mL via INTRAVENOUS
  Filled 2014-11-30: qty 10

## 2014-11-30 MED ORDER — DEXAMETHASONE 4 MG PO TABS: ORAL_TABLET | ORAL | Status: DC

## 2014-11-30 NOTE — Telephone Encounter (Signed)
Gave avs & calendar for April/May. Sent message to schedule treatment.

## 2014-11-30 NOTE — Telephone Encounter (Signed)
Per staff message and POF I have scheduled appts. Advised scheduler of appts. JMW  

## 2014-11-30 NOTE — Patient Instructions (Signed)

## 2014-11-30 NOTE — Progress Notes (Addendum)
[facility] [phone]   [phone]  OFFICE PROGRESS NOTE  Mathews Argyle, MD [address] [place] [address]  DIAGNOSIS: Stage IV (T1b, N2, M1b) non-small cell lung cancer, adenocarcinoma, presented with a right lower lobe lung mass as well as mediastinal, hilar, bilateral supraclavicular lymphadenopathy in addition to extensive liver metastases and retroperitoneal lymphadenopathy diagnosed in December of 2014.  MOLECULAR BIOMARKERS: [facility] biomarker testing revealed TP53 E204*, KEAP1 R666f*25+, NFKBIA amplification, NKX2-1 amplification, SMARCA4 E463*. The patient was negative for EGFR and ALK gene translocation   PRIOR THERAPY:  1) Systemic chemotherapy with carboplatin for AUC of 5 and Alimta 500 mg/M2 every 3 weeks, status post 3 cycles. First dose 09/02/2013.  2)  Systemic chemotherapy with cisplatin 60 mg/M2 on days 1 and gemcitabine 800 mg/M2 on days 1 and 8 every 3 weeks, first cycle on 11/12/2013. Status post 12 cycles. Discontinued secondary to hypersensitivity reaction to cisplatin during cycle #12. 3) Systemic chemotherapy with gemcitabine 1000 mg/M2 on days 1 and 8 every 3 weeks, first cycle on 07/29/2014. Status post 3 cycles discontinued today secondary to disease progression.  CURRENT THERAPY: Immunotherapy with Nivolumab 3 MG/KG every 2 weeks. First cycle 10/05/2014. Status post 4 cycles  CHEMOTHERAPY INTENT: palliative  CURRENT # OF CHEMOTHERAPY CYCLES: 4  CURRENT ANTIEMETICS: Zofran, dexamethasone and Compazine.  CURRENT SMOKING STATUS: former smoker  ORAL CHEMOTHERAPY AND CONSENT: None  CURRENT BISPHOSPHONATES USE: None  PAIN MANAGEMENT: 0/10  NARCOTICS INDUCED CONSTIPATION: None  LIVING WILL AND CODE STATUS: Full Code  INTERVAL HISTORY: Amber MCDOUGALD64y.o. female returns to the clinic today for follow up visit accompanied by her husband. She is status post 4 cycles of immunotherapy  with Nivolumab. She tolerated this treatment without any significant adverse effects. Specifically she had no problems with shortness of breath, skin rash or diarrhea. She continues to complain of fatigue as well as right upper quadrant abdominal pain. She continues to have some peripheral neuropathy but has had some improvement. TThe patient denied having any significant nausea or vomiting. She denied having any fever or chills. She has no significant weight loss or night sweats. She denied having any significant chest pain, shortness of breath, cough or hemoptysis. She recently had a restaging CT scan of the chest, abdomen and pelvis and presents to discuss the results.  MEDICAL HISTORY: Past Medical History  Diagnosis Date  . HTN (hypertension)   . lung ca dx'd 07/2013    ALLERGIES:  is allergic to cisplatin; hctz; and plendil.  MEDICATIONS:  Current Outpatient Prescriptions  Medication Sig Dispense Refill  . amLODipine (NORVASC) 10 MG tablet Take 10 mg by mouth every morning.     .Marland Kitchenaspirin EC 81 MG tablet Take 81 mg by mouth every morning.     . calcium carbonate (TUMS - DOSED IN MG ELEMENTAL CALCIUM) 500 MG chewable tablet Chew 1 tablet by mouth 3 (three) times daily as needed for indigestion or heartburn.     . DULoxetine (CYMBALTA) 30 MG capsule Take 1 capsule (30 mg total) by mouth daily. 90 capsule 1  . lidocaine-prilocaine (EMLA) cream APPLY 1 APPLICATION TOPICALLY AS NEEDED. APPLY TO PORT 1 HOUR BEFORE CHEMO APPOINTMENT 30 g 0  . lisinopril (PRINIVIL,ZESTRIL) 20 MG tablet Take 20 mg by mouth every morning.     . nebivolol (BYSTOLIC) 10 MG tablet Take 10 mg by mouth every morning.     .Marland Kitchenomeprazole (PRILOSEC) 40 MG capsule  Take 1 capsule (40 mg total) by mouth daily. 90 capsule 1  . oxyCODONE (ROXICODONE) 5 MG immediate release tablet Take 1 tablet (5 mg total) by mouth every 6 (six) hours as needed for severe pain. 60 tablet 0  . prochlorperazine (COMPAZINE) 10 MG tablet Take 1  tablet (10 mg total) by mouth every 6 (six) hours as needed for nausea or vomiting. 90 tablet 1  . dexamethasone (DECADRON) 4 MG tablet Take 2 tablets by mouth twice daily with food the day before, the day of, and the day after chemotherapy 50 tablet 1  . loratadine (CLARITIN) 10 MG tablet Take as directed 30 tablet 1   Current Facility-Administered Medications  Medication Dose Route Frequency Provider Last Rate Last Dose  . sodium chloride 0.9 % injection 10 mL  10 mL Intravenous PRN Curt Bears, MD   10 mL at 11/30/14 1245   Facility-Administered Medications Ordered in Other Visits  Medication Dose Route Frequency Provider Last Rate Last Dose  . sodium chloride 0.9 % injection 10 mL  10 mL Intracatheter PRN Curt Bears, MD   10 mL at 11/16/14 1625    REVIEW OF SYSTEMS:  Constitutional: positive for fatigue Eyes: negative Ears, nose, mouth, throat, and face: negative Respiratory: negative Cardiovascular: negative Gastrointestinal: negative Genitourinary:negative Integument/breast: negative Hematologic/lymphatic: negative Musculoskeletal:negative Neurological: positive for paresthesia Behavioral/Psych: negative Endocrine: negative Allergic/Immunologic: negative   PHYSICAL EXAMINATION: General appearance: alert, cooperative and no distress Head: Normocephalic, without obvious abnormality, atraumatic Neck: no adenopathy, no JVD, supple, symmetrical, trachea midline and thyroid not enlarged, symmetric, no tenderness/mass/nodules Lymph nodes: Cervical, supraclavicular, and axillary nodes normal. Resp: clear to auscultation bilaterally Back: symmetric, no curvature. ROM normal. No CVA tenderness. Cardio: regular rate and rhythm, S1, S2 normal, no murmur, click, rub or gallop GI: soft, non-tender; bowel sounds normal; no masses,  no organomegaly Extremities: extremities normal, atraumatic, no cyanosis or edema Neurologic: Alert and oriented X 3, normal strength and tone. Normal  symmetric reflexes. Normal coordination and gait  ECOG PERFORMANCE STATUS: 1 - Symptomatic but completely ambulatory  There were no vitals taken for this visit.  LABORATORY DATA: Lab Results  Component Value Date   WBC 6.7 11/30/2014   HGB 11.0* 11/30/2014   HCT 33.0* 11/30/2014   MCV 95.4 11/30/2014   PLT 204 11/30/2014      Chemistry      Component Value Date/Time   NA 137 11/30/2014 1157   NA 135 05/13/2014 1007   K 4.2 11/30/2014 1157   K 4.5 05/13/2014 1007   CL 96 05/13/2014 1007   CO2 23 11/30/2014 1157   CO2 29 05/13/2014 1007   BUN 16.1 11/30/2014 1157   BUN 18 05/13/2014 1007   CREATININE 0.9 11/30/2014 1157   CREATININE 1.00 05/13/2014 1007      Component Value Date/Time   CALCIUM 9.3 11/30/2014 1157   CALCIUM 9.5 05/13/2014 1007       RADIOGRAPHIC STUDIES: Ct Chest W Contrast  11/29/2014   CLINICAL DATA:  Right lung cancer with ongoing chemotherapy, cough, and anemia.  EXAM: CT CHEST, ABDOMEN, AND PELVIS WITH CONTRAST  TECHNIQUE: Multidetector CT imaging of the chest, abdomen and pelvis was performed following the standard protocol during bolus administration of intravenous contrast.  CONTRAST:  119m OMNIPAQUE IOHEXOL 300 MG/ML  SOLN  COMPARISON:  09/28/2013  FINDINGS: CT CHEST FINDINGS  Mediastinum/Nodes: Aortic and branch vessel atherosclerosis. Right hilar node 0.8 cm in short axis, previously 0.7 cm. Right infrahilar node 0.6 cm in short axis,  previously the same. Port-A-Cath tip in the upper right atrium.  Lungs/Pleura: Biapical pleural parenchymal scarring. Centrilobular emphysema.  Right lower lobe pulmonary nodule 2.7 by 2.7 cm, previously 1.4 by 1.1 cm, measured on image 34 series 4 today. Adjacent nodularity compatible with mucus plugging or satellite nodules including a 1.5 by 1.0 cm nodule on image 35 series 4. Occluded segmental airways in the vicinity of the nodule. Small right pleural effusion, increased from prior, nonspecific with regard to  transudative or exudative etiology.  Minimal nodularity in the right upper lobe is probably inflammatory but merits observation.  Musculoskeletal: Mild thoracic spondylosis  CT ABDOMEN PELVIS FINDINGS  Hepatobiliary: Increase in size and number of hepatic metastatic lesions. For example, a new 1.1 cm right hepatic lobe mass is present on image 45 of series 2, and an index segment 4 mass on image 48 of series 2 measures 2.9 by 2.9 cm, previously 1.5 by 1.6 cm. All segments of the liver contain masses.  Pancreas: Unremarkable  Spleen: Unremarkable  Adrenals/Urinary Tract: Unremarkable  Stomach/Bowel: Prominent stool throughout the colon favors constipation.  Vascular/Lymphatic: Aortoiliac atherosclerotic vascular disease. Porta hepatis lymph node 0.9 cm in short axis, image 58 series 2. Gastrohepatic ligament lymph nodes measure up to about 7 mm as on image 51 series 2. The number of porta hepatis lymph nodes is increased, if not their size.  Reproductive: There masses of the uterus favoring fibroids although a right fundal subserosal lesion is somewhat more heterogeneous than typically encountered and measures 5.6 cm in diameter. There is also a trace amount of free fluid adjacent to this apparent subserosal fundal fibroid, significance uncertain.  Other: Small amount of free pelvic fluid adjacent to the right fundal fibroid uterus as noted above.  Musculoskeletal: Degenerative disc disease and degenerative endplate findings at Z6-O2.  IMPRESSION: 1. Enlarged right lower lobe pulmonary nodule. The surrounding right lower lobe nodularity could represent satellite tumor nodules or postobstructive inflammatory nodules. 2. Increased size of existing liver masses. Scattered new liver masses are present. Appearance compatible with metastatic disease. 3. Minimal nodularity in the right upper lobe is probably inflammatory but merits observation. 4. Other findings include atherosclerosis, spondylosis, upper normal size but  increased number porta hepatis and gastrohepatic ligament lymph nodes; uterine fibroid; and trace amount of free pelvic fluid.   Electronically Signed   By: Van Clines M.D.   On: 11/29/2014 08:31   Ct Abdomen Pelvis W Contrast  11/29/2014   CLINICAL DATA:  Right lung cancer with ongoing chemotherapy, cough, and anemia.  EXAM: CT CHEST, ABDOMEN, AND PELVIS WITH CONTRAST  TECHNIQUE: Multidetector CT imaging of the chest, abdomen and pelvis was performed following the standard protocol during bolus administration of intravenous contrast.  CONTRAST:  162m OMNIPAQUE IOHEXOL 300 MG/ML  SOLN  COMPARISON:  09/28/2013  FINDINGS: CT CHEST FINDINGS  Mediastinum/Nodes: Aortic and branch vessel atherosclerosis. Right hilar node 0.8 cm in short axis, previously 0.7 cm. Right infrahilar node 0.6 cm in short axis, previously the same. Port-A-Cath tip in the upper right atrium.  Lungs/Pleura: Biapical pleural parenchymal scarring. Centrilobular emphysema.  Right lower lobe pulmonary nodule 2.7 by 2.7 cm, previously 1.4 by 1.1 cm, measured on image 34 series 4 today. Adjacent nodularity compatible with mucus plugging or satellite nodules including a 1.5 by 1.0 cm nodule on image 35 series 4. Occluded segmental airways in the vicinity of the nodule. Small right pleural effusion, increased from prior, nonspecific with regard to transudative or exudative etiology.  Minimal nodularity in  the right upper lobe is probably inflammatory but merits observation.  Musculoskeletal: Mild thoracic spondylosis  CT ABDOMEN PELVIS FINDINGS  Hepatobiliary: Increase in size and number of hepatic metastatic lesions. For example, a new 1.1 cm right hepatic lobe mass is present on image 45 of series 2, and an index segment 4 mass on image 48 of series 2 measures 2.9 by 2.9 cm, previously 1.5 by 1.6 cm. All segments of the liver contain masses.  Pancreas: Unremarkable  Spleen: Unremarkable  Adrenals/Urinary Tract: Unremarkable  Stomach/Bowel:  Prominent stool throughout the colon favors constipation.  Vascular/Lymphatic: Aortoiliac atherosclerotic vascular disease. Porta hepatis lymph node 0.9 cm in short axis, image 58 series 2. Gastrohepatic ligament lymph nodes measure up to about 7 mm as on image 51 series 2. The number of porta hepatis lymph nodes is increased, if not their size.  Reproductive: There masses of the uterus favoring fibroids although a right fundal subserosal lesion is somewhat more heterogeneous than typically encountered and measures 5.6 cm in diameter. There is also a trace amount of free fluid adjacent to this apparent subserosal fundal fibroid, significance uncertain.  Other: Small amount of free pelvic fluid adjacent to the right fundal fibroid uterus as noted above.  Musculoskeletal: Degenerative disc disease and degenerative endplate findings at C9-S4.  IMPRESSION: 1. Enlarged right lower lobe pulmonary nodule. The surrounding right lower lobe nodularity could represent satellite tumor nodules or postobstructive inflammatory nodules. 2. Increased size of existing liver masses. Scattered new liver masses are present. Appearance compatible with metastatic disease. 3. Minimal nodularity in the right upper lobe is probably inflammatory but merits observation. 4. Other findings include atherosclerosis, spondylosis, upper normal size but increased number porta hepatis and gastrohepatic ligament lymph nodes; uterine fibroid; and trace amount of free pelvic fluid.   Electronically Signed   By: Van Clines M.D.   On: 11/29/2014 08:31   ASSESSMENT AND PLAN: This is a very pleasant 64 years old white female recently diagnosed with metastatic non-small cell lung cancer, adenocarcinoma presented with right lower lobe lung mass in addition to mediastinal and supraclavicular lymphadenopathy as well as diffuse metastatic liver lesions and retroperitoneal lymphadenopathy. She underwent 3 cycles of systemic chemotherapy with carboplatin  and Alimta but unfortunately has evidence for disease progression especially in the liver and new small left adrenal metastasis. She is currently undergoing second line chemotherapy with cisplatin and gemcitabine status post 12 cycles and tolerating it fairly well patient after reducing the dose of cisplatin. This treatment was discontinued after the patient developed hypersensitivity reaction to cisplatin. The patient also completed 3 cycles of chemotherapy with single agent gemcitabine but unfortunately the recent CT scan of the chest, abdomen and pelvis showed evidence for disease progression especially in the liver. The patient is currently on immunotherapy with single agent Nivolumab 3 MG/KG every 2 weeks is status post 4 cycles. She is tolerating her treatment well. Her recent restaging CT scan revealed evidence for disease progression with enlarged right lower lobe pulmonary nodule as well as increased size of the existing liver metastasis as well as scattered new liver masses. Patient was discussed with and also seen by Dr. Julien Nordmann. The CT scan results were reviewed with patient and her husband. As she has evidence for disease progression we will discontinue treatment with Nivolumab. We'll plan to start the patient on systemic chemotherapy with docetaxel plus Cyramza. I will notify our managed care Department so they can obtain the necessary preauthorization. We will hopefully proceed with the first cycle next  week. She will return in one week with CBC deficiency met and hopefully proceed with cycle 1 of her systemic chemotherapy with docetaxel and Cyramza. Prescription for dexamethasone 8 mg by mouth twice daily to be taken the day before, day of, and the day after chemotherapy and a prescription for Claritin to be taken as directed were sent her pharmacy of record via E scribed.  She was advised to call immediately if she has any concerning symptoms in the interval. The patient voices understanding  of current disease status and treatment options and is in agreement with the current care plan.  All questions were answered. The patient knows to call the clinic with any problems, questions or concerns. We can certainly see the patient much sooner if necessary.  Carlton Adam , PA-C 11/30/2014   ADDENDUM: Hematology/Oncology Attending:  I had a face to face encounter with the patient. I recommended her care plan. This is a very pleasant 64 years old white female with stage IV non-small cell lung cancer, adenocarcinoma status post several chemotherapy regimens including carboplatin and Alimta, cisplatin and gemcitabine as well as single agent gemcitabine and most recently treatment with immunotherapy with Nivolumab status post 4 cycles but unfortunately has evidence for disease progression on his recent scan. I had a lengthy discussion with the patient and her husband today about her scan results and treatment options. I gave the patient the option of palliative care versus consideration of systemic chemotherapy with docetaxel and Cyramza. I discussed with the patient adverse effect of this treatment including but not limited to alopecia, myelosuppression, nausea and vomiting, peripheral neuropathy, liver or renal dysfunction as well as blackbox warning of Cyramza including pulmonary hemorrhage, GI perforation and wound healing delay. The patient is interested in proceeding with the systemic chemotherapy. She is expected to start the first cycle of this treatment next week. The patient will start premedication with Decadron 8 mg by mouth twice a day the day before, day of and day after the chemotherapy. She would come back for follow-up visit in one month's for reevaluation and management of any adverse effect of her treatment. The patient was advised to call immediately if she has any concerning symptoms in the interval.  Disclaimer: This note was dictated with voice recognition software.  Similar sounding words can inadvertently be transcribed and may not be corrected upon review. Eilleen Kempf., MD 12/05/2014

## 2014-12-03 ENCOUNTER — Telehealth: Payer: Self-pay | Admitting: Internal Medicine

## 2014-12-03 NOTE — Telephone Encounter (Signed)
returned call and corrected all appts....pt ok and aware

## 2014-12-03 NOTE — Patient Instructions (Signed)
Your restaging CT scan showed evidence for disease progression. We will discontinue treatment with Nivolumab Follow-up in one week to begin systemic chemotherapy with docetaxel and Cyramza

## 2014-12-07 ENCOUNTER — Ambulatory Visit (HOSPITAL_BASED_OUTPATIENT_CLINIC_OR_DEPARTMENT_OTHER): Payer: 59

## 2014-12-07 ENCOUNTER — Ambulatory Visit (HOSPITAL_BASED_OUTPATIENT_CLINIC_OR_DEPARTMENT_OTHER): Payer: 59 | Admitting: Nurse Practitioner

## 2014-12-07 ENCOUNTER — Other Ambulatory Visit (HOSPITAL_BASED_OUTPATIENT_CLINIC_OR_DEPARTMENT_OTHER): Payer: 59

## 2014-12-07 ENCOUNTER — Ambulatory Visit: Payer: 59

## 2014-12-07 DIAGNOSIS — C3431 Malignant neoplasm of lower lobe, right bronchus or lung: Secondary | ICD-10-CM

## 2014-12-07 DIAGNOSIS — C7972 Secondary malignant neoplasm of left adrenal gland: Secondary | ICD-10-CM | POA: Diagnosis not present

## 2014-12-07 DIAGNOSIS — C787 Secondary malignant neoplasm of liver and intrahepatic bile duct: Secondary | ICD-10-CM

## 2014-12-07 DIAGNOSIS — T7849XA Other allergy, initial encounter: Secondary | ICD-10-CM

## 2014-12-07 DIAGNOSIS — C3491 Malignant neoplasm of unspecified part of right bronchus or lung: Secondary | ICD-10-CM

## 2014-12-07 DIAGNOSIS — Z79899 Other long term (current) drug therapy: Secondary | ICD-10-CM

## 2014-12-07 DIAGNOSIS — T7840XA Allergy, unspecified, initial encounter: Secondary | ICD-10-CM

## 2014-12-07 DIAGNOSIS — Z95828 Presence of other vascular implants and grafts: Secondary | ICD-10-CM

## 2014-12-07 DIAGNOSIS — Z5112 Encounter for antineoplastic immunotherapy: Secondary | ICD-10-CM

## 2014-12-07 LAB — COMPREHENSIVE METABOLIC PANEL (CC13)
ALBUMIN: 3.4 g/dL — AB (ref 3.5–5.0)
ALT: 40 U/L (ref 0–55)
AST: 54 U/L — AB (ref 5–34)
Alkaline Phosphatase: 266 U/L — ABNORMAL HIGH (ref 40–150)
Anion Gap: 10 mEq/L (ref 3–11)
BUN: 18 mg/dL (ref 7.0–26.0)
CALCIUM: 9.7 mg/dL (ref 8.4–10.4)
CO2: 22 mEq/L (ref 22–29)
Chloride: 104 mEq/L (ref 98–109)
Creatinine: 1 mg/dL (ref 0.6–1.1)
EGFR: 63 mL/min/{1.73_m2} — ABNORMAL LOW (ref >90)
GLUCOSE: 163 mg/dL — AB (ref 70–140)
POTASSIUM: 4.1 meq/L (ref 3.5–5.1)
Sodium: 136 mEq/L (ref 136–145)
TOTAL PROTEIN: 7.2 g/dL (ref 6.4–8.3)
Total Bilirubin: 0.42 mg/dL (ref 0.20–1.20)

## 2014-12-07 LAB — CBC WITH DIFFERENTIAL/PLATELET
BASO%: 0 % (ref 0.0–2.0)
BASOS ABS: 0 10*3/uL (ref 0.0–0.1)
EOS%: 0 % (ref 0.0–7.0)
Eosinophils Absolute: 0 10*3/uL (ref 0.0–0.5)
HCT: 34.3 % — ABNORMAL LOW (ref 34.8–46.6)
HGB: 11.4 g/dL — ABNORMAL LOW (ref 11.6–15.9)
LYMPH#: 1.5 10*3/uL (ref 0.9–3.3)
LYMPH%: 9 % — ABNORMAL LOW (ref 14.0–49.7)
MCH: 31.2 pg (ref 25.1–34.0)
MCHC: 33.2 g/dL (ref 31.5–36.0)
MCV: 94 fL (ref 79.5–101.0)
MONO#: 0.4 10*3/uL (ref 0.1–0.9)
MONO%: 2.4 % (ref 0.0–14.0)
NEUT%: 88.6 % — ABNORMAL HIGH (ref 38.4–76.8)
NEUTROS ABS: 14.3 10*3/uL — AB (ref 1.5–6.5)
Platelets: 251 10*3/uL (ref 145–400)
RBC: 3.65 10*6/uL — ABNORMAL LOW (ref 3.70–5.45)
RDW: 13.8 % (ref 11.2–14.5)
WBC: 16.1 10*3/uL — ABNORMAL HIGH (ref 3.9–10.3)

## 2014-12-07 LAB — UA PROTEIN, DIPSTICK - CHCC: Protein, ur: 30 mg/dL

## 2014-12-07 LAB — TSH CHCC: TSH: 0.88 m[IU]/L (ref 0.308–3.960)

## 2014-12-07 MED ORDER — DOCETAXEL CHEMO INJECTION 160 MG/16ML
75.0000 mg/m2 | Freq: Once | INTRAVENOUS | Status: AC
  Administered 2014-12-07: 120 mg via INTRAVENOUS
  Filled 2014-12-07: qty 12

## 2014-12-07 MED ORDER — DIPHENHYDRAMINE HCL 50 MG/ML IJ SOLN
50.0000 mg | Freq: Once | INTRAMUSCULAR | Status: AC | PRN
  Administered 2014-12-07: 50 mg via INTRAVENOUS

## 2014-12-07 MED ORDER — DIPHENHYDRAMINE HCL 50 MG/ML IJ SOLN
INTRAMUSCULAR | Status: AC
  Filled 2014-12-07: qty 1

## 2014-12-07 MED ORDER — LIDOCAINE-PRILOCAINE 2.5-2.5 % EX CREA: TOPICAL_CREAM | Freq: Once | CUTANEOUS | Status: DC

## 2014-12-07 MED ORDER — DIPHENHYDRAMINE HCL 50 MG/ML IJ SOLN
50.0000 mg | Freq: Once | INTRAMUSCULAR | Status: AC
  Administered 2014-12-07: 50 mg via INTRAVENOUS

## 2014-12-07 MED ORDER — ACETAMINOPHEN 325 MG PO TABS
650.0000 mg | ORAL_TABLET | Freq: Once | ORAL | Status: AC
  Administered 2014-12-07: 650 mg via ORAL

## 2014-12-07 MED ORDER — SODIUM CHLORIDE 0.9 % IV SOLN
Freq: Once | INTRAVENOUS | Status: AC | PRN
  Administered 2014-12-07: 14:00:00 via INTRAVENOUS

## 2014-12-07 MED ORDER — METHYLPREDNISOLONE SODIUM SUCC 125 MG IJ SOLR
125.0000 mg | Freq: Once | INTRAMUSCULAR | Status: AC | PRN
  Administered 2014-12-07: 125 mg via INTRAVENOUS

## 2014-12-07 MED ORDER — SODIUM CHLORIDE 0.9 % IV SOLN
Freq: Once | INTRAVENOUS | Status: AC
  Administered 2014-12-07: 12:00:00 via INTRAVENOUS

## 2014-12-07 MED ORDER — HEPARIN SOD (PORK) LOCK FLUSH 100 UNIT/ML IV SOLN
500.0000 [IU] | Freq: Once | INTRAVENOUS | Status: AC | PRN
  Administered 2014-12-07: 500 [IU]
  Filled 2014-12-07: qty 5

## 2014-12-07 MED ORDER — FAMOTIDINE IN NACL 20-0.9 MG/50ML-% IV SOLN
20.0000 mg | Freq: Once | INTRAVENOUS | Status: AC | PRN
  Administered 2014-12-07: 20 mg via INTRAVENOUS

## 2014-12-07 MED ORDER — SODIUM CHLORIDE 0.9 % IV SOLN
10.0000 mg/kg | Freq: Once | INTRAVENOUS | Status: AC
  Administered 2014-12-07: 500 mg via INTRAVENOUS
  Filled 2014-12-07: qty 50

## 2014-12-07 MED ORDER — SODIUM CHLORIDE 0.9 % IJ SOLN
10.0000 mL | INTRAMUSCULAR | Status: DC | PRN
  Administered 2014-12-07: 10 mL via INTRAVENOUS
  Filled 2014-12-07: qty 10

## 2014-12-07 MED ORDER — SODIUM CHLORIDE 0.9 % IJ SOLN
10.0000 mL | INTRAMUSCULAR | Status: DC | PRN
  Administered 2014-12-07: 10 mL
  Filled 2014-12-07: qty 10

## 2014-12-07 MED ORDER — ACETAMINOPHEN 325 MG PO TABS
ORAL_TABLET | ORAL | Status: AC
  Filled 2014-12-07: qty 2

## 2014-12-07 MED ORDER — SODIUM CHLORIDE 0.9 % IV SOLN
Freq: Once | INTRAVENOUS | Status: AC
  Administered 2014-12-07: 12:00:00 via INTRAVENOUS
  Filled 2014-12-07: qty 4

## 2014-12-07 NOTE — Progress Notes (Signed)
Elevated WBC with no fever or obvious signs of infection; left foot swelling with no calf tenderness or leg swelling. Dr. Julien Nordmann notified and reviewed labs. Attributes these changes to steroids. OK to treat.   1340 cyramza near complete, patient reports itching. Patient is flushed with hives to back, neck, and chest. Also reporting feeling "jittery" but pt attributes this to steroids and premed benadryl. Cyramza stopped, VS obtained and stable. Hypersensitivity protocol initiated and Selena Lesser, NP notified.  1430 patient observed on NS at this time, hives persist. Selena Lesser, NP to chairside to reassess.   1500 hives to chest, neck, and back remain visible but improving overall and patient reports improving pruritis and jitteriness. Restart chemotherapy at half rate per Dr. Julien Nordmann and Selena Lesser, NP. Per MD, patient to receive taxotere today after completion of cyramza.   1530 cyramza infusion complete, hives improving. Patient tolerated remainder without new symptoms or complaints.

## 2014-12-07 NOTE — Patient Instructions (Signed)

## 2014-12-07 NOTE — Patient Instructions (Signed)
[facility] Discharge Instructions for Patients Receiving Chemotherapy  Today you received the following chemotherapy agents: Cyramza and Taxotere   To help prevent nausea and vomiting after your treatment, we encourage you to take your nausea medication as prescribed.    If you develop nausea and vomiting that is not controlled by your nausea medication, call the clinic.   BELOW ARE SYMPTOMS THAT SHOULD BE REPORTED IMMEDIATELY:  *FEVER GREATER THAN 100.5 F  *CHILLS WITH OR WITHOUT FEVER  NAUSEA AND VOMITING THAT IS NOT CONTROLLED WITH YOUR NAUSEA MEDICATION  *UNUSUAL SHORTNESS OF BREATH  *UNUSUAL BRUISING OR BLEEDING  TENDERNESS IN MOUTH AND THROAT WITH OR WITHOUT PRESENCE OF ULCERS  *URINARY PROBLEMS  *BOWEL PROBLEMS  UNUSUAL RASH Items with * indicate a potential emergency and should be followed up as soon as possible.  Feel free to call the clinic you have any questions or concerns. The clinic phone number is [phone].  Please show the [place] at check-in to the Emergency Department and triage nurse.  Ramucirumab injection What is this medicine? RAMUCIRUMAB (ra mue SIR ue mab) is a chemotherapy drug. It is used to treat stomach cancer, colorectal cancer, or lung cancer. This drug targets a specific protein receptor on cancer cells and stops the cancer cells from growing. This medicine may be used for other purposes; ask your health care provider or pharmacist if you have questions. COMMON BRAND NAME(S): Cyramza What should I tell my health care provider before I take this medicine? They need to know if you have any of these conditions: -bleeding disorders -blood clots -heart disease, including heart failure, heart attack, or chest pain (angina) -high blood pressure -infection (especially a virus infection such as chickenpox, cold sores, or herpes) -protein in your urine -recent surgery -stroke -an unusual or allergic reaction to  ramucirumab, other medicines, foods, dyes, or preservatives -pregnant or trying to get pregnant -breast-feeding How should I use this medicine? This medicine is for infusion into a vein. It is given by a health care professional in a hospital or clinic setting. Talk to your pediatrician regarding the use of this medicine in children. Special care may be needed. Overdosage: If you think you've taken too much of this medicine contact a poison control center or emergency room at once. Overdosage: If you think you have taken too much of this medicine contact a poison control center or emergency room at once. NOTE: This medicine is only for you. Do not share this medicine with others. What if I miss a dose? It is important not to miss your dose. Call your doctor or health care professional if you are unable to keep an appointment. What may interact with this medicine? Interactions have not been studied. This list may not describe all possible interactions. Give your health care provider a list of all the medicines, herbs, non-prescription drugs, or dietary supplements you use. Also tell them if you smoke, drink alcohol, or use illegal drugs. Some items may interact with your medicine. What should I watch for while using this medicine? Your condition will be monitored carefully while you are receiving this medicine. You will need to to check your blood pressure and have your blood and urine tested while you are taking this medicine. Your condition will be monitored carefully while you are receiving this medicine. This medicine may increase your risk to bruise or bleed. Call your doctor or health care professional if you notice any unusual bleeding. This medicine may rarely  cause 'gastrointestinal perforation' (holes in the stomach, intestines or colon), a serious side effect requiring surgery to repair. This medicine should be started at least 28 days following major surgery and the site of the surgery  should be totally healed. Check with your doctor before scheduling dental work or surgery while you are receiving this treatment. Talk to your doctor if you have recently had surgery or if you have a wound that has not healed. Do not become pregnant while taking this medicine or for 3 months after stopping it. Women should inform their doctor if they wish to become pregnant or think they might be pregnant. There is a potential for serious side effects to an unborn child. Talk to your health care professional or pharmacist for more information. What side effects may I notice from receiving this medicine? Side effects that you should report to your doctor or health care professional as soon as possible: -allergic reactions like skin rash, itching or hives, breathing problems, swelling of the face, lips, or tongue -signs of infection - fever or chills, cough, sore throat -chest pain or chest tightness -confusion -dizziness -feeling faint or lightheaded, falls -severe abdominal pain -severe nausea, vomiting -signs and symptoms of bleeding such as bloody or black, tarry stools; red or dark-brown urine; spitting up blood or brown material that looks like coffee grounds; red spots on the skin; unusual bruising or bleeding from the eye, gums, or nose -signs and symptoms of a blood clot such as breathing problems; changes in vision; chest pain; severe, sudden headache; pain, swelling, warmth in the leg; trouble speaking; sudden numbness or weakness of the face, arm or leg -symptoms of a stroke: change in mental awareness, inability to talk or move one side of the body -trouble walking, dizziness, loss of balance or coordination Side effects that usually do not require medical attention (Report these to your doctor or health care professional if they continue or are bothersome.): -cold, clammy skin -constipation -diarrhea -headache -nausea, vomiting -stomach pain -unusually slow heartbeat -unusually  weak or tired This list may not describe all possible side effects. Call your doctor for medical advice about side effects. You may report side effects to FDA at 1-800-FDA-1088. Where should I keep my medicine? This drug is given in a hospital or clinic and will not be stored at home. NOTE: This sheet is a summary. It may not cover all possible information. If you have questions about this medicine, talk to your doctor, pharmacist, or health care provider.  2015, Elsevier/Gold Standard. (2013-12-22 13:05:27) Docetaxel injection What is this medicine? DOCETAXEL (doe se TAX el) is a chemotherapy drug. It targets fast dividing cells, like cancer cells, and causes these cells to die. This medicine is used to treat many types of cancers like breast cancer, certain stomach cancers, head and neck cancer, lung cancer, and prostate cancer. This medicine may be used for other purposes; ask your health care provider or pharmacist if you have questions. COMMON BRAND NAME(S): Docefrez, Taxotere What should I tell my health care provider before I take this medicine? They need to know if you have any of these conditions: -infection (especially a virus infection such as chickenpox, cold sores, or herpes) -liver disease -low blood counts, like low white cell, platelet, or red cell counts -an unusual or allergic reaction to docetaxel, polysorbate 80, other chemotherapy agents, other medicines, foods, dyes, or preservatives -pregnant or trying to get pregnant -breast-feeding How should I use this medicine? This drug is given as an  infusion into a vein. It is administered in a hospital or clinic by a specially trained health care professional. Talk to your pediatrician regarding the use of this medicine in children. Special care may be needed. Overdosage: If you think you have taken too much of this medicine contact a poison control center or emergency room at once. NOTE: This medicine is only for you. Do not  share this medicine with others. What if I miss a dose? It is important not to miss your dose. Call your doctor or health care professional if you are unable to keep an appointment. What may interact with this medicine? -cyclosporine -erythromycin -ketoconazole -medicines to increase blood counts like filgrastim, pegfilgrastim, sargramostim -vaccines Talk to your doctor or health care professional before taking any of these medicines: -acetaminophen -aspirin -ibuprofen -ketoprofen -naproxen This list may not describe all possible interactions. Give your health care provider a list of all the medicines, herbs, non-prescription drugs, or dietary supplements you use. Also tell them if you smoke, drink alcohol, or use illegal drugs. Some items may interact with your medicine. What should I watch for while using this medicine? Your condition will be monitored carefully while you are receiving this medicine. You will need important blood work done while you are taking this medicine. This drug may make you feel generally unwell. This is not uncommon, as chemotherapy can affect healthy cells as well as cancer cells. Report any side effects. Continue your course of treatment even though you feel ill unless your doctor tells you to stop. In some cases, you may be given additional medicines to help with side effects. Follow all directions for their use. Call your doctor or health care professional for advice if you get a fever, chills or sore throat, or other symptoms of a cold or flu. Do not treat yourself. This drug decreases your body's ability to fight infections. Try to avoid being around people who are sick. This medicine may increase your risk to bruise or bleed. Call your doctor or health care professional if you notice any unusual bleeding. Be careful brushing and flossing your teeth or using a toothpick because you may get an infection or bleed more easily. If you have any dental work done, tell  your dentist you are receiving this medicine. Avoid taking products that contain aspirin, acetaminophen, ibuprofen, naproxen, or ketoprofen unless instructed by your doctor. These medicines may hide a fever. This medicine contains an alcohol in the product. You may get drowsy or dizzy. Do not drive, use machinery, or do anything that needs mental alertness until you know how this medicine affects you. Do not stand or sit up quickly, especially if you are an older patient. This reduces the risk of dizzy or fainting spells. Avoid alcoholic drinks Do not become pregnant while taking this medicine. Women should inform their doctor if they wish to become pregnant or think they might be pregnant. There is a potential for serious side effects to an unborn child. Talk to your health care professional or pharmacist for more information. Do not breast-feed an infant while taking this medicine. What side effects may I notice from receiving this medicine? Side effects that you should report to your doctor or health care professional as soon as possible: -allergic reactions like skin rash, itching or hives, swelling of the face, lips, or tongue -low blood counts - This drug may decrease the number of white blood cells, red blood cells and platelets. You may be at increased risk for infections  and bleeding. -signs of infection - fever or chills, cough, sore throat, pain or difficulty passing urine -signs of decreased platelets or bleeding - bruising, pinpoint red spots on the skin, black, tarry stools, nosebleeds -signs of decreased red blood cells - unusually weak or tired, fainting spells, lightheadedness -breathing problems -fast or irregular heartbeat -low blood pressure -mouth sores -nausea and vomiting -pain, swelling, redness or irritation at the injection site -pain, tingling, numbness in the hands or feet -swelling of the ankle, feet, hands -weight gain Side effects that usually do not require medical  attention (report to your prescriber or health care professional if they continue or are bothersome): -bone pain -complete hair loss including hair on your head, underarms, pubic hair, eyebrows, and eyelashes -diarrhea -excessive tearing -changes in the color of fingernails -loosening of the fingernails -nausea -muscle pain -red flush to skin -sweating -weak or tired This list may not describe all possible side effects. Call your doctor for medical advice about side effects. You may report side effects to FDA at 1-800-FDA-1088. Where should I keep my medicine? This drug is given in a hospital or clinic and will not be stored at home. NOTE: This sheet is a summary. It may not cover all possible information. If you have questions about this medicine, talk to your doctor, pharmacist, or health care provider.  2015, Elsevier/Gold Standard. (2013-07-09 22:21:02)

## 2014-12-08 ENCOUNTER — Encounter: Payer: Self-pay | Admitting: Nurse Practitioner

## 2014-12-08 ENCOUNTER — Ambulatory Visit: Payer: 59

## 2014-12-08 ENCOUNTER — Telehealth: Payer: Self-pay | Admitting: Medical Oncology

## 2014-12-08 NOTE — Assessment & Plan Note (Signed)
Patient presented to the [facility] today to receive her first cycle of Cyramza/docetaxel chemotherapy regimen.  She did develop some hives during the Cyramza portion of the infusion; but hypersensitivity reaction was managed per hypersensitivity protocol.  Patient was able to complete all of the Cyramza chemotherapy; as well as the docetaxel chemotherapy infusion.  Patient has plans to return on 12/28/2014 for cycle 2 of the same regimen.

## 2014-12-08 NOTE — Assessment & Plan Note (Signed)
Patient had completed approximately two thirds of her first cycle of Cyramza infusion; when she developed some generalized hives.  Infusion was held; and patient was given Benadryl 50 mg IV, Pepcid 20 mg IV, insight Medrol 125 mg IV.  Confirmed the patient had been given both Tylenol and Benadryl prior to chemotherapy as premedications.  On exam-patient with some mild, generalized hives to her neck, chest, and back.  No other hypersensitivity issues whatsoever noted on exam.  Vital signs remained stable throughout.  Also symptoms did resolve with the hypersensitivity protocol medications.  Patient was able to complete both the Cyramza infusion; as well as the docetaxel chemotherapy.

## 2014-12-08 NOTE — Progress Notes (Signed)
SYMPTOM MANAGEMENT CLINIC   HPI: Amber Hayden 64 y.o. female diagnosed with lung cancer.  Here today to initiate Cyramza/docetaxel chemotherapy regimen.  Patient had completed approximately two thirds of her first cycle of Cyramza infusion; when she developed some generalized hives.  Infusion was held; and patient was given Benadryl 50 mg IV, Pepcid 20 mg IV, insight Medrol 125 mg IV.  Confirmed the patient had been given both Tylenol and Benadryl prior to chemotherapy as premedications.  On exam-patient with some mild, generalized hives to her neck, chest, and back.  No other hypersensitivity issues whatsoever noted on exam.  Vital signs remained stable throughout.  Also symptoms did resolve with the hypersensitivity protocol medications.  Patient was able to complete both the Cyramza infusion; as well as the docetaxel chemotherapy.   HPI  ROS  Past Medical History  Diagnosis Date  . HTN (hypertension)   . lung ca dx'd 07/2013    History reviewed. No pertinent past surgical history.  has HTN (hypertension); Tobacco use disorder; Cancer of right lung; Nausea with vomiting; Neuropathy due to chemotherapeutic drug; Hearing loss; Hypersensitivity reaction; and Anemia associated with chemotherapy on her problem list.    is allergic to cisplatin; hctz; and plendil.    Medication List       This list is accurate as of: 12/07/14 11:59 PM.  Always use your most recent med list.               amLODipine 10 MG tablet  Commonly known as:  NORVASC  Take 10 mg by mouth every morning.     aspirin EC 81 MG tablet  Take 81 mg by mouth every morning.     calcium carbonate 500 MG chewable tablet  Commonly known as:  TUMS - dosed in mg elemental calcium  Chew 1 tablet by mouth 3 (three) times daily as needed for indigestion or heartburn.     dexamethasone 4 MG tablet  Commonly known as:  DECADRON  Take 2 tablets by mouth twice daily with food the day before, the day of, and the day  after chemotherapy     DULoxetine 30 MG capsule  Commonly known as:  CYMBALTA  Take 1 capsule (30 mg total) by mouth daily.     lidocaine-prilocaine cream  Commonly known as:  EMLA  Apply topically once.     lisinopril 20 MG tablet  Commonly known as:  PRINIVIL,ZESTRIL  Take 20 mg by mouth every morning.     loratadine 10 MG tablet  Commonly known as:  CLARITIN  Take as directed     nebivolol 10 MG tablet  Commonly known as:  BYSTOLIC  Take 10 mg by mouth every morning.     omeprazole 40 MG capsule  Commonly known as:  PRILOSEC  Take 1 capsule (40 mg total) by mouth daily.     oxyCODONE 5 MG immediate release tablet  Commonly known as:  ROXICODONE  Take 1 tablet (5 mg total) by mouth every 6 (six) hours as needed for severe pain.     prochlorperazine 10 MG tablet  Commonly known as:  COMPAZINE  Take 1 tablet (10 mg total) by mouth every 6 (six) hours as needed for nausea or vomiting.         PHYSICAL EXAMINATION  Oncology Vitals 12/07/2014 12/07/2014 12/07/2014 12/07/2014 12/07/2014 12/07/2014 12/07/2014  Height - - - - - - -  Weight - - - - - - -  Weight (lbs) - - - - - - -  BMI (kg/m2) - - - - - - -  Temp 97.6 98.2 98 98.3 97.9 97.4 98.2  Pulse 63 65 64 66 64 64 63  Resp 18 18 18 18 16 16 18   SpO2 99 100 100 100 99 - -  BSA (m2) - - - - - - -   BP Readings from Last 3 Encounters:  12/07/14 129/65  11/30/14 103/82  11/16/14 120/61    Physical Exam  Constitutional: She is oriented to person, place, and time and well-developed, well-nourished, and in no distress.  HENT:  Head: Normocephalic and atraumatic.  Mouth/Throat: Oropharynx is clear and moist.  Eyes: Conjunctivae and EOM are normal. Pupils are equal, round, and reactive to light. Right eye exhibits no discharge. Left eye exhibits no discharge. No scleral icterus.  Neck: Normal range of motion. Neck supple.  Cardiovascular: Intact distal pulses.   Pulmonary/Chest: Effort normal. No stridor. No  respiratory distress.  Musculoskeletal: Normal range of motion. She exhibits no edema or tenderness.  Neurological: She is alert and oriented to person, place, and time.  Skin: Skin is warm and dry. Rash noted. There is erythema. No pallor.  Patient with some mild, generalized hives to her neck, chest, and back.  Hives did essentially resolve with hypersensitivity medications  Psychiatric: Affect normal.  Nursing note and vitals reviewed.   LABORATORY DATA:. Appointment on 12/07/2014  Component Date Value Ref Range Status  . WBC 12/07/2014 16.1* 3.9 - 10.3 10e3/uL Final  . NEUT# 12/07/2014 14.3* 1.5 - 6.5 10e3/uL Final  . HGB 12/07/2014 11.4* 11.6 - 15.9 g/dL Final  . HCT 12/07/2014 34.3* 34.8 - 46.6 % Final  . Platelets 12/07/2014 251  145 - 400 10e3/uL Final  . MCV 12/07/2014 94.0  79.5 - 101.0 fL Final  . MCH 12/07/2014 31.2  25.1 - 34.0 pg Final  . MCHC 12/07/2014 33.2  31.5 - 36.0 g/dL Final  . RBC 12/07/2014 3.65* 3.70 - 5.45 10e6/uL Final  . RDW 12/07/2014 13.8  11.2 - 14.5 % Final  . lymph# 12/07/2014 1.5  0.9 - 3.3 10e3/uL Final  . MONO# 12/07/2014 0.4  0.1 - 0.9 10e3/uL Final  . Eosinophils Absolute 12/07/2014 0.0  0.0 - 0.5 10e3/uL Final  . Basophils Absolute 12/07/2014 0.0  0.0 - 0.1 10e3/uL Final  . NEUT% 12/07/2014 88.6* 38.4 - 76.8 % Final  . LYMPH% 12/07/2014 9.0* 14.0 - 49.7 % Final  . MONO% 12/07/2014 2.4  0.0 - 14.0 % Final  . EOS% 12/07/2014 0.0  0.0 - 7.0 % Final  . BASO% 12/07/2014 0.0  0.0 - 2.0 % Final  . TSH 12/07/2014 0.880  0.308 - 3.960 m(IU)/L Final  . Protein, ur 12/07/2014 30  Negative- <30 mg/dL Final  . Sodium 12/07/2014 136  136 - 145 mEq/L Final  . Potassium 12/07/2014 4.1  3.5 - 5.1 mEq/L Final  . Chloride 12/07/2014 104  98 - 109 mEq/L Final  . CO2 12/07/2014 22  22 - 29 mEq/L Final  . Glucose 12/07/2014 163* 70 - 140 mg/dl Final  . BUN 12/07/2014 18.0  7.0 - 26.0 mg/dL Final  . Creatinine 12/07/2014 1.0  0.6 - 1.1 mg/dL Final  . Total  Bilirubin 12/07/2014 0.42  0.20 - 1.20 mg/dL Final  . Alkaline Phosphatase 12/07/2014 266* 40 - 150 U/L Final  . AST 12/07/2014 54* 5 - 34 U/L Final  . ALT 12/07/2014 40  0 - 55 U/L Final  . Total Protein 12/07/2014 7.2  6.4 - 8.3  g/dL Final  . Albumin 12/07/2014 3.4* 3.5 - 5.0 g/dL Final  . Calcium 12/07/2014 9.7  8.4 - 10.4 mg/dL Final  . Anion Gap 12/07/2014 10  3 - 11 mEq/L Final  . EGFR 12/07/2014 63* >90 ml/min/1.73 m2 Final   eGFR is calculated using the CKD-EPI Creatinine Equation (2009)     RADIOGRAPHIC STUDIES: No results found.  ASSESSMENT/PLAN:    Cancer of right lung Patient presented to the [facility] today to receive her first cycle of Cyramza/docetaxel chemotherapy regimen.  She did develop some hives during the Cyramza portion of the infusion; but hypersensitivity reaction was managed per hypersensitivity protocol.  Patient was able to complete all of the Cyramza chemotherapy; as well as the docetaxel chemotherapy infusion.  Patient has plans to return on 12/28/2014 for cycle 2 of the same regimen.   Hypersensitivity reaction Patient had completed approximately two thirds of her first cycle of Cyramza infusion; when she developed some generalized hives.  Infusion was held; and patient was given Benadryl 50 mg IV, Pepcid 20 mg IV, insight Medrol 125 mg IV.  Confirmed the patient had been given both Tylenol and Benadryl prior to chemotherapy as premedications.  On exam-patient with some mild, generalized hives to her neck, chest, and back.  No other hypersensitivity issues whatsoever noted on exam.  Vital signs remained stable throughout.  Also symptoms did resolve with the hypersensitivity protocol medications.  Patient was able to complete both the Cyramza infusion; as well as the docetaxel chemotherapy.   Patient stated understanding of all instructions; and was in agreement with this plan of care. The patient knows to call the clinic with any problems, questions  or concerns.   Review/collaboration with Dr. Julien Nordmann regarding all aspects of patient's visit today.   Total time spent with patient was 40 minutes;  with greater than 75 percent of that time spent in face to face counseling regarding patient's symptoms,  and coordination of care and follow up.  Disclaimer: This note was dictated with voice recognition software. Similar sounding words can inadvertently be transcribed and may not be corrected upon review.   Drue Second, NP 12/08/2014

## 2014-12-08 NOTE — Telephone Encounter (Signed)
Talked to pt and she  Is feeling good. She still itches some , but ,the hives on face and neck are gone. I told pt we will adjust her may injection appt time in may

## 2014-12-09 ENCOUNTER — Ambulatory Visit (HOSPITAL_BASED_OUTPATIENT_CLINIC_OR_DEPARTMENT_OTHER): Payer: 59

## 2014-12-09 VITALS — BP 138/70 | HR 66 | Temp 98.3°F

## 2014-12-09 DIAGNOSIS — C787 Secondary malignant neoplasm of liver and intrahepatic bile duct: Secondary | ICD-10-CM

## 2014-12-09 DIAGNOSIS — Z5189 Encounter for other specified aftercare: Secondary | ICD-10-CM

## 2014-12-09 DIAGNOSIS — C3431 Malignant neoplasm of lower lobe, right bronchus or lung: Secondary | ICD-10-CM

## 2014-12-09 DIAGNOSIS — C3491 Malignant neoplasm of unspecified part of right bronchus or lung: Secondary | ICD-10-CM

## 2014-12-09 MED ORDER — PEGFILGRASTIM INJECTION 6 MG/0.6ML ~~LOC~~
6.0000 mg | PREFILLED_SYRINGE | Freq: Once | SUBCUTANEOUS | Status: AC
  Administered 2014-12-09: 6 mg via SUBCUTANEOUS
  Filled 2014-12-09: qty 0.6

## 2014-12-09 NOTE — Patient Instructions (Signed)
Pegfilgrastim injection What is this medicine? PEGFILGRASTIM (peg fil GRA stim) is a long-acting granulocyte colony-stimulating factor that stimulates the growth of neutrophils, a type of white blood cell important in the body's fight against infection. It is used to reduce the incidence of fever and infection in patients with certain types of cancer who are receiving chemotherapy that affects the bone marrow. This medicine may be used for other purposes; ask your health care provider or pharmacist if you have questions. COMMON BRAND NAME(S): Neulasta What should I tell my health care provider before I take this medicine? They need to know if you have any of these conditions: -latex allergy -ongoing radiation therapy -sickle cell disease -skin reactions to acrylic adhesives (On-Body Injector only) -an unusual or allergic reaction to pegfilgrastim, filgrastim, other medicines, foods, dyes, or preservatives -pregnant or trying to get pregnant -breast-feeding How should I use this medicine? This medicine is for injection under the skin. If you get this medicine at home, you will be taught how to prepare and give the pre-filled syringe or how to use the On-body Injector. Refer to the patient Instructions for Use for detailed instructions. Use exactly as directed. Take your medicine at regular intervals. Do not take your medicine more often than directed. It is important that you put your used needles and syringes in a special sharps container. Do not put them in a trash can. If you do not have a sharps container, call your pharmacist or healthcare provider to get one. Talk to your pediatrician regarding the use of this medicine in children. Special care may be needed. Overdosage: If you think you have taken too much of this medicine contact a poison control center or emergency room at once. NOTE: This medicine is only for you. Do not share this medicine with others. What if I miss a dose? It is  important not to miss your dose. Call your doctor or health care professional if you miss your dose. If you miss a dose due to an On-body Injector failure or leakage, a new dose should be administered as soon as possible using a single prefilled syringe for manual use. What may interact with this medicine? Interactions have not been studied. Give your health care provider a list of all the medicines, herbs, non-prescription drugs, or dietary supplements you use. Also tell them if you smoke, drink alcohol, or use illegal drugs. Some items may interact with your medicine. This list may not describe all possible interactions. Give your health care provider a list of all the medicines, herbs, non-prescription drugs, or dietary supplements you use. Also tell them if you smoke, drink alcohol, or use illegal drugs. Some items may interact with your medicine. What should I watch for while using this medicine? You may need blood work done while you are taking this medicine. If you are going to need a MRI, CT scan, or other procedure, tell your doctor that you are using this medicine (On-Body Injector only). What side effects may I notice from receiving this medicine? Side effects that you should report to your doctor or health care professional as soon as possible: -allergic reactions like skin rash, itching or hives, swelling of the face, lips, or tongue -dizziness -fever -pain, redness, or irritation at site where injected -pinpoint red spots on the skin -shortness of breath or breathing problems -stomach or side pain, or pain at the shoulder -swelling -tiredness -trouble passing urine Side effects that usually do not require medical attention (report to your doctor   or health care professional if they continue or are bothersome): -bone pain -muscle pain This list may not describe all possible side effects. Call your doctor for medical advice about side effects. You may report side effects to FDA at  1-800-FDA-1088. Where should I keep my medicine? Keep out of the reach of children. Store pre-filled syringes in a refrigerator between 2 and 8 degrees C (36 and 46 degrees F). Do not freeze. Keep in carton to protect from light. Throw away this medicine if it is left out of the refrigerator for more than 48 hours. Throw away any unused medicine after the expiration date. NOTE: This sheet is a summary. It may not cover all possible information. If you have questions about this medicine, talk to your doctor, pharmacist, or health care provider.  2015, Elsevier/[employer] Standard. (2013-11-12 16:14:05)  

## 2014-12-14 ENCOUNTER — Ambulatory Visit: Payer: 59

## 2014-12-14 ENCOUNTER — Other Ambulatory Visit: Payer: 59

## 2014-12-28 ENCOUNTER — Ambulatory Visit: Payer: 59

## 2014-12-28 ENCOUNTER — Telehealth: Payer: Self-pay | Admitting: Nurse Practitioner

## 2014-12-28 ENCOUNTER — Other Ambulatory Visit (HOSPITAL_BASED_OUTPATIENT_CLINIC_OR_DEPARTMENT_OTHER): Payer: 59

## 2014-12-28 ENCOUNTER — Ambulatory Visit (HOSPITAL_BASED_OUTPATIENT_CLINIC_OR_DEPARTMENT_OTHER): Payer: 59

## 2014-12-28 ENCOUNTER — Ambulatory Visit (HOSPITAL_BASED_OUTPATIENT_CLINIC_OR_DEPARTMENT_OTHER): Payer: 59 | Admitting: Nurse Practitioner

## 2014-12-28 VITALS — BP 150/66 | HR 83 | Temp 98.2°F | Resp 18 | Ht 62.0 in | Wt 119.9 lb

## 2014-12-28 DIAGNOSIS — C3431 Malignant neoplasm of lower lobe, right bronchus or lung: Secondary | ICD-10-CM | POA: Diagnosis not present

## 2014-12-28 DIAGNOSIS — Z95828 Presence of other vascular implants and grafts: Secondary | ICD-10-CM

## 2014-12-28 DIAGNOSIS — Z5112 Encounter for antineoplastic immunotherapy: Secondary | ICD-10-CM

## 2014-12-28 DIAGNOSIS — C3491 Malignant neoplasm of unspecified part of right bronchus or lung: Secondary | ICD-10-CM

## 2014-12-28 DIAGNOSIS — Z79899 Other long term (current) drug therapy: Secondary | ICD-10-CM

## 2014-12-28 DIAGNOSIS — Z5111 Encounter for antineoplastic chemotherapy: Secondary | ICD-10-CM | POA: Diagnosis not present

## 2014-12-28 DIAGNOSIS — L509 Urticaria, unspecified: Secondary | ICD-10-CM | POA: Diagnosis not present

## 2014-12-28 DIAGNOSIS — C3490 Malignant neoplasm of unspecified part of unspecified bronchus or lung: Secondary | ICD-10-CM

## 2014-12-28 DIAGNOSIS — M899 Disorder of bone, unspecified: Secondary | ICD-10-CM

## 2014-12-28 LAB — CBC WITH DIFFERENTIAL/PLATELET
BASO%: 0.4 % (ref 0.0–2.0)
Basophils Absolute: 0.1 10*3/uL (ref 0.0–0.1)
EOS%: 0 % (ref 0.0–7.0)
Eosinophils Absolute: 0 10*3/uL (ref 0.0–0.5)
HCT: 32.5 % — ABNORMAL LOW (ref 34.8–46.6)
HGB: 10.8 g/dL — ABNORMAL LOW (ref 11.6–15.9)
LYMPH%: 7.8 % — AB (ref 14.0–49.7)
MCH: 31.3 pg (ref 25.1–34.0)
MCHC: 33.2 g/dL (ref 31.5–36.0)
MCV: 94.2 fL (ref 79.5–101.0)
MONO#: 0.2 10*3/uL (ref 0.1–0.9)
MONO%: 1.4 % (ref 0.0–14.0)
NEUT#: 12 10*3/uL — ABNORMAL HIGH (ref 1.5–6.5)
NEUT%: 90.4 % — AB (ref 38.4–76.8)
PLATELETS: 249 10*3/uL (ref 145–400)
RBC: 3.45 10*6/uL — ABNORMAL LOW (ref 3.70–5.45)
RDW: 16.6 % — ABNORMAL HIGH (ref 11.2–14.5)
WBC: 13.2 10*3/uL — ABNORMAL HIGH (ref 3.9–10.3)
lymph#: 1 10*3/uL (ref 0.9–3.3)

## 2014-12-28 LAB — COMPREHENSIVE METABOLIC PANEL (CC13)
ALBUMIN: 2.9 g/dL — AB (ref 3.5–5.0)
ALT: 31 U/L (ref 0–55)
AST: 37 U/L — ABNORMAL HIGH (ref 5–34)
Alkaline Phosphatase: 228 U/L — ABNORMAL HIGH (ref 40–150)
Anion Gap: 12 mEq/L — ABNORMAL HIGH (ref 3–11)
BUN: 19.8 mg/dL (ref 7.0–26.0)
CALCIUM: 9.3 mg/dL (ref 8.4–10.4)
CHLORIDE: 107 meq/L (ref 98–109)
CO2: 20 mEq/L — ABNORMAL LOW (ref 22–29)
Creatinine: 0.8 mg/dL (ref 0.6–1.1)
EGFR: 74 mL/min/{1.73_m2} — AB (ref >90)
GLUCOSE: 201 mg/dL — AB (ref 70–140)
Potassium: 4.5 mEq/L (ref 3.5–5.1)
Sodium: 138 mEq/L (ref 136–145)
Total Bilirubin: 0.26 mg/dL (ref 0.20–1.20)
Total Protein: 6.3 g/dL — ABNORMAL LOW (ref 6.4–8.3)

## 2014-12-28 LAB — TSH CHCC: TSH: 0.756 m[IU]/L (ref 0.308–3.960)

## 2014-12-28 LAB — UA PROTEIN, DIPSTICK - CHCC: Protein, ur: <30 mg/dL

## 2014-12-28 MED ORDER — HEPARIN SOD (PORK) LOCK FLUSH 100 UNIT/ML IV SOLN
500.0000 [IU] | Freq: Once | INTRAVENOUS | Status: AC | PRN
  Administered 2014-12-28: 500 [IU]
  Filled 2014-12-28: qty 5

## 2014-12-28 MED ORDER — DIPHENHYDRAMINE HCL 50 MG/ML IJ SOLN
50.0000 mg | Freq: Once | INTRAMUSCULAR | Status: AC
  Administered 2014-12-28: 50 mg via INTRAVENOUS

## 2014-12-28 MED ORDER — SODIUM CHLORIDE 0.9 % IV SOLN
Freq: Once | INTRAVENOUS | Status: AC
  Administered 2014-12-28: 13:00:00 via INTRAVENOUS

## 2014-12-28 MED ORDER — OXYCODONE HCL 5 MG PO TABS: 5.0000 mg | ORAL_TABLET | Freq: Four times a day (QID) | ORAL | Status: AC | PRN

## 2014-12-28 MED ORDER — DOCETAXEL CHEMO INJECTION 160 MG/16ML
75.0000 mg/m2 | Freq: Once | INTRAVENOUS | Status: AC
  Administered 2014-12-28: 120 mg via INTRAVENOUS
  Filled 2014-12-28: qty 12

## 2014-12-28 MED ORDER — ACETAMINOPHEN 325 MG PO TABS
ORAL_TABLET | ORAL | Status: AC
  Filled 2014-12-28: qty 2

## 2014-12-28 MED ORDER — SODIUM CHLORIDE 0.9 % IV SOLN
10.0000 mg/kg | Freq: Once | INTRAVENOUS | Status: AC
  Administered 2014-12-28: 500 mg via INTRAVENOUS
  Filled 2014-12-28: qty 50

## 2014-12-28 MED ORDER — SODIUM CHLORIDE 0.9 % IV SOLN
Freq: Once | INTRAVENOUS | Status: AC
  Administered 2014-12-28: 13:00:00 via INTRAVENOUS
  Filled 2014-12-28: qty 4

## 2014-12-28 MED ORDER — SODIUM CHLORIDE 0.9 % IJ SOLN
10.0000 mL | INTRAMUSCULAR | Status: DC | PRN
  Administered 2014-12-28: 10 mL via INTRAVENOUS
  Filled 2014-12-28: qty 10

## 2014-12-28 MED ORDER — ACETAMINOPHEN 325 MG PO TABS
650.0000 mg | ORAL_TABLET | Freq: Once | ORAL | Status: AC
  Administered 2014-12-28: 650 mg via ORAL

## 2014-12-28 MED ORDER — DIPHENHYDRAMINE HCL 50 MG/ML IJ SOLN
INTRAMUSCULAR | Status: AC
  Filled 2014-12-28: qty 1

## 2014-12-28 MED ORDER — SODIUM CHLORIDE 0.9 % IJ SOLN
10.0000 mL | INTRAMUSCULAR | Status: DC | PRN
  Administered 2014-12-28: 10 mL
  Filled 2014-12-28: qty 10

## 2014-12-28 NOTE — Telephone Encounter (Signed)
per pof to sch pt appt-gave pt copy of sch °

## 2014-12-28 NOTE — Patient Instructions (Signed)
Ramucirumab injection What is this medicine? RAMUCIRUMAB (ra mue SIR ue mab) is a chemotherapy drug. It is used to treat stomach cancer, colorectal cancer, or lung cancer. This drug targets a specific protein receptor on cancer cells and stops the cancer cells from growing. This medicine may be used for other purposes; ask your health care provider or pharmacist if you have questions. COMMON BRAND NAME(S): Cyramza What should I tell my health care provider before I take this medicine? They need to know if you have any of these conditions: -bleeding disorders -blood clots -heart disease, including heart failure, heart attack, or chest pain (angina) -high blood pressure -infection (especially a virus infection such as chickenpox, cold sores, or herpes) -protein in your urine -recent surgery -stroke -an unusual or allergic reaction to ramucirumab, other medicines, foods, dyes, or preservatives -pregnant or trying to get pregnant -breast-feeding How should I use this medicine? This medicine is for infusion into a vein. It is given by a health care professional in a hospital or clinic setting. Talk to your pediatrician regarding the use of this medicine in children. Special care may be needed. Overdosage: If you think you've taken too much of this medicine contact a poison control center or emergency room at once. Overdosage: If you think you have taken too much of this medicine contact a poison control center or emergency room at once. NOTE: This medicine is only for you. Do not share this medicine with others. What if I miss a dose? It is important not to miss your dose. Call your doctor or health care professional if you are unable to keep an appointment. What may interact with this medicine? Interactions have not been studied. This list may not describe all possible interactions. Give your health care provider a list of all the medicines, herbs, non-prescription drugs, or dietary  supplements you use. Also tell them if you smoke, drink alcohol, or use illegal drugs. Some items may interact with your medicine. What should I watch for while using this medicine? Your condition will be monitored carefully while you are receiving this medicine. You will need to to check your blood pressure and have your blood and urine tested while you are taking this medicine. Your condition will be monitored carefully while you are receiving this medicine. This medicine may increase your risk to bruise or bleed. Call your doctor or health care professional if you notice any unusual bleeding. This medicine may rarely cause 'gastrointestinal perforation' (holes in the stomach, intestines or colon), a serious side effect requiring surgery to repair. This medicine should be started at least 28 days following major surgery and the site of the surgery should be totally healed. Check with your doctor before scheduling dental work or surgery while you are receiving this treatment. Talk to your doctor if you have recently had surgery or if you have a wound that has not healed. Do not become pregnant while taking this medicine or for 3 months after stopping it. Women should inform their doctor if they wish to become pregnant or think they might be pregnant. There is a potential for serious side effects to an unborn child. Talk to your health care professional or pharmacist for more information. What side effects may I notice from receiving this medicine? Side effects that you should report to your doctor or health care professional as soon as possible: -allergic reactions like skin rash, itching or hives, breathing problems, swelling of the face, lips, or tongue -signs of infection - fever   or chills, cough, sore throat -chest pain or chest tightness -confusion -dizziness -feeling faint or lightheaded, falls -severe abdominal pain -severe nausea, vomiting -signs and symptoms of bleeding such as bloody or  black, tarry stools; red or dark-brown urine; spitting up blood or brown material that looks like coffee grounds; red spots on the skin; unusual bruising or bleeding from the eye, gums, or nose -signs and symptoms of a blood clot such as breathing problems; changes in vision; chest pain; severe, sudden headache; pain, swelling, warmth in the leg; trouble speaking; sudden numbness or weakness of the face, arm or leg -symptoms of a stroke: change in mental awareness, inability to talk or move one side of the body -trouble walking, dizziness, loss of balance or coordination Side effects that usually do not require medical attention (Report these to your doctor or health care professional if they continue or are bothersome.): -cold, clammy skin -constipation -diarrhea -headache -nausea, vomiting -stomach pain -unusually slow heartbeat -unusually weak or tired This list may not describe all possible side effects. Call your doctor for medical advice about side effects. You may report side effects to FDA at 1-800-FDA-1088. Where should I keep my medicine? This drug is given in a hospital or clinic and will not be stored at home. NOTE: This sheet is a summary. It may not cover all possible information. If you have questions about this medicine, talk to your doctor, pharmacist, or health care provider.  2015, Elsevier/Gold Standard. (2013-12-22 13:05:27) Docetaxel injection What is this medicine? DOCETAXEL (doe se TAX el) is a chemotherapy drug. It targets fast dividing cells, like cancer cells, and causes these cells to die. This medicine is used to treat many types of cancers like breast cancer, certain stomach cancers, head and neck cancer, lung cancer, and prostate cancer. This medicine may be used for other purposes; ask your health care provider or pharmacist if you have questions. COMMON BRAND NAME(S): Docefrez, Taxotere What should I tell my health care provider before I take this  medicine? They need to know if you have any of these conditions: -infection (especially a virus infection such as chickenpox, cold sores, or herpes) -liver disease -low blood counts, like low white cell, platelet, or red cell counts -an unusual or allergic reaction to docetaxel, polysorbate 80, other chemotherapy agents, other medicines, foods, dyes, or preservatives -pregnant or trying to get pregnant -breast-feeding How should I use this medicine? This drug is given as an infusion into a vein. It is administered in a hospital or clinic by a specially trained health care professional. Talk to your pediatrician regarding the use of this medicine in children. Special care may be needed. Overdosage: If you think you have taken too much of this medicine contact a poison control center or emergency room at once. NOTE: This medicine is only for you. Do not share this medicine with others. What if I miss a dose? It is important not to miss your dose. Call your doctor or health care professional if you are unable to keep an appointment. What may interact with this medicine? -cyclosporine -erythromycin -ketoconazole -medicines to increase blood counts like filgrastim, pegfilgrastim, sargramostim -vaccines Talk to your doctor or health care professional before taking any of these medicines: -acetaminophen -aspirin -ibuprofen -ketoprofen -naproxen This list may not describe all possible interactions. Give your health care provider a list of all the medicines, herbs, non-prescription drugs, or dietary supplements you use. Also tell them if you smoke, drink alcohol, or use illegal drugs. Some items   may interact with your medicine. What should I watch for while using this medicine? Your condition will be monitored carefully while you are receiving this medicine. You will need important blood work done while you are taking this medicine. This drug may make you feel generally unwell. This is not  uncommon, as chemotherapy can affect healthy cells as well as cancer cells. Report any side effects. Continue your course of treatment even though you feel ill unless your doctor tells you to stop. In some cases, you may be given additional medicines to help with side effects. Follow all directions for their use. Call your doctor or health care professional for advice if you get a fever, chills or sore throat, or other symptoms of a cold or flu. Do not treat yourself. This drug decreases your body's ability to fight infections. Try to avoid being around people who are sick. This medicine may increase your risk to bruise or bleed. Call your doctor or health care professional if you notice any unusual bleeding. Be careful brushing and flossing your teeth or using a toothpick because you may get an infection or bleed more easily. If you have any dental work done, tell your dentist you are receiving this medicine. Avoid taking products that contain aspirin, acetaminophen, ibuprofen, naproxen, or ketoprofen unless instructed by your doctor. These medicines may hide a fever. This medicine contains an alcohol in the product. You may get drowsy or dizzy. Do not drive, use machinery, or do anything that needs mental alertness until you know how this medicine affects you. Do not stand or sit up quickly, especially if you are an older patient. This reduces the risk of dizzy or fainting spells. Avoid alcoholic drinks Do not become pregnant while taking this medicine. Women should inform their doctor if they wish to become pregnant or think they might be pregnant. There is a potential for serious side effects to an unborn child. Talk to your health care professional or pharmacist for more information. Do not breast-feed an infant while taking this medicine. What side effects may I notice from receiving this medicine? Side effects that you should report to your doctor or health care professional as soon as  possible: -allergic reactions like skin rash, itching or hives, swelling of the face, lips, or tongue -low blood counts - This drug may decrease the number of white blood cells, red blood cells and platelets. You may be at increased risk for infections and bleeding. -signs of infection - fever or chills, cough, sore throat, pain or difficulty passing urine -signs of decreased platelets or bleeding - bruising, pinpoint red spots on the skin, black, tarry stools, nosebleeds -signs of decreased red blood cells - unusually weak or tired, fainting spells, lightheadedness -breathing problems -fast or irregular heartbeat -low blood pressure -mouth sores -nausea and vomiting -pain, swelling, redness or irritation at the injection site -pain, tingling, numbness in the hands or feet -swelling of the ankle, feet, hands -weight gain Side effects that usually do not require medical attention (report to your prescriber or health care professional if they continue or are bothersome): -bone pain -complete hair loss including hair on your head, underarms, pubic hair, eyebrows, and eyelashes -diarrhea -excessive tearing -changes in the color of fingernails -loosening of the fingernails -nausea -muscle pain -red flush to skin -sweating -weak or tired This list may not describe all possible side effects. Call your doctor for medical advice about side effects. You may report side effects to FDA at 1-800-FDA-1088. Where should   I keep my medicine? This drug is given in a hospital or clinic and will not be stored at home. NOTE: This sheet is a summary. It may not cover all possible information. If you have questions about this medicine, talk to your doctor, pharmacist, or health care provider.  2015, Elsevier/Gold Standard. (2013-07-09 22:21:02)  

## 2014-12-28 NOTE — Patient Instructions (Signed)

## 2014-12-28 NOTE — Progress Notes (Addendum)
[place] OFFICE PROGRESS NOTE   DIAGNOSIS: Stage IV (T1b, N2, M1b) non-small cell lung cancer, adenocarcinoma, presented with a right lower lobe lung mass as well as mediastinal, hilar, bilateral supraclavicular lymphadenopathy in addition to extensive liver metastases and retroperitoneal lymphadenopathy diagnosed in December of 2014.  MOLECULAR BIOMARKERS: [facility] biomarker testing revealed TP53 E204*, KEAP1 R682f*25+, NFKBIA amplification, NKX2-1 amplification, SMARCA4 E463*. The patient was negative for EGFR and ALK gene translocation   PRIOR THERAPY:  1) Systemic chemotherapy with carboplatin for AUC of 5 and Alimta 500 mg/M2 every 3 weeks, status post 3 cycles. First dose 09/02/2013.  2) Systemic chemotherapy with cisplatin 60 mg/M2 on days 1 and gemcitabine 800 mg/M2 on days 1 and 8 every 3 weeks, first cycle on 11/12/2013. Status post 12 cycles. Discontinued secondary to hypersensitivity reaction to cisplatin during cycle #12. 3) Systemic chemotherapy with gemcitabine 1000 mg/M2 on days 1 and 8 every 3 weeks, first cycle on 07/29/2014. Status post 3 cycles discontinued today secondary to disease progression. 4)  Immunotherapy with Nivolumab 3 MG/KG every 2 weeks. First cycle 10/05/2014. Status post 4 cycles  with disease progression.  CURRENT THERAPY: Docetaxol 75 mg/m and Cyramza 10 mg/kg every 3 weeks, first cycle 12/07/2014 INTERVAL HISTORY:   Ms. KHowsereturns as scheduled. She completed cycle 1 docetaxel and cyramza 12/07/2014. Toward the completion of the cyramza infusion she developed hives. Hypersensitivity protocol was initiated with improvement. Treatment was resumed and completed. She denies any nausea/vomiting. She has had some intermittent mild mouth sores. She uses biotin mouth rinse. No diarrhea. Main complaint is of "flulike" symptoms lasting for 3-5 days following treatment. She had significant bone pain. She took oxycodone as needed. She  occasionally sees a small amount of blood with nose blowing and when she brushes her teeth. No shortness of breath or chest pain. She is beginning to note hair loss.  Objective:  Vital signs in last 24 hours:  Blood pressure 150/66, pulse 83, temperature 98.2 F (36.8 C), temperature source Oral, resp. rate 18, height 5' 2"  (1.575 m), weight 119 lb 14.4 oz (54.386 kg), SpO2 100 %.    HEENT: No thrush or ulcers. Lymphatics: No palpable cervical or supra-clavicular lymph nodes. Resp: Lungs clear bilaterally. Cardio: Regular rate and rhythm. GI: Abdomen is soft and nontender. Liver palpable right upper abdomen. Vascular: Trace edema at the lower legs bilaterally. Calves soft and nontender. Neuro: Markedly hard of hearing.  Skin: No rash. Port-A-Cath without erythema.    Lab Results:  Lab Results  Component Value Date   WBC 13.2* 12/28/2014   HGB 10.8* 12/28/2014   HCT 32.5* 12/28/2014   MCV 94.2 12/28/2014   PLT 249 12/28/2014   NEUTROABS 12.0* 12/28/2014    Imaging:  No results found.  Medications: I have reviewed the patient's current medications.  Assessment/Plan: 1. Stage IV non-small cell lung cancer, adenocarcinoma status post several chemotherapy regimens including carboplatin and Alimta, cisplatin and gemcitabine as well as single agent gemcitabine and most recently treatment with immunotherapy with Nivolumab status post 4 cycles with disease progression. Treatment initiated with docetaxel and cyramza 12/07/2014. 2. Bone pain secondary to Neulasta and/or docetaxel. 3. Urticaria toward completion of cycle 1 cyramza.    Disposition: Ms. KPeckhamappears stable. She has completed 1 cycle of docetaxel and cyramza. Plan to proceed with cycle 2 today as scheduled.   She developed an urticarial skin rash toward the completion of the cyramza but was able to complete the infusion. She had significant  bone pain secondary to Neulasta and/or docetaxel. She will continue oxycodone  as needed if this occurs again. A new prescription was provided at today's visit.  Labs to be checked on a weekly basis. She will return for a follow-up visit and cycle 3 docetaxel/cyramza in 3 weeks. She will contact the office in the interim with any problems.  Patient seen with Dr. Julien Nordmann.    Ned Card ANP/GNP-BC   12/28/2014  12:22 PM  ADDENDUM: Hematology/Oncology Attending: I had a face to face encounter with the patient today. I recommended her care plan. This is a very pleasant 64 years old white female with stage IV non-small cell lung cancer, adenocarcinoma currently undergoing systemic chemotherapy with docetaxel and Cyramza is status post 1 cycle. She tolerated the first cycle of her treatment fairly well except for fatigue and flulike symptoms that lasted for several days after the Neulasta injection.. She is feeling much better today and reviewed to start cycle #2 of her treatment. I recommended for the patient to take Claritin as well as pain medication for the adverse effect of the Neulasta injection. She would come back for follow-up visit in 3 weeks with the start of cycle #3. The patient was advised to call immediately if she has any concerning symptoms in the interval.  Disclaimer: This note was dictated with voice recognition software. Similar sounding words can inadvertently be transcribed and may be missed upon review. Eilleen Kempf., MD 12/28/2014

## 2014-12-29 ENCOUNTER — Ambulatory Visit: Payer: 59

## 2014-12-30 ENCOUNTER — Ambulatory Visit (HOSPITAL_BASED_OUTPATIENT_CLINIC_OR_DEPARTMENT_OTHER): Payer: 59

## 2014-12-30 VITALS — BP 143/71 | HR 67 | Temp 97.9°F

## 2014-12-30 DIAGNOSIS — C3491 Malignant neoplasm of unspecified part of right bronchus or lung: Secondary | ICD-10-CM

## 2014-12-30 DIAGNOSIS — Z5189 Encounter for other specified aftercare: Secondary | ICD-10-CM | POA: Diagnosis not present

## 2014-12-30 DIAGNOSIS — C3431 Malignant neoplasm of lower lobe, right bronchus or lung: Secondary | ICD-10-CM | POA: Diagnosis not present

## 2014-12-30 DIAGNOSIS — C787 Secondary malignant neoplasm of liver and intrahepatic bile duct: Secondary | ICD-10-CM | POA: Diagnosis not present

## 2014-12-30 MED ORDER — PEGFILGRASTIM INJECTION 6 MG/0.6ML ~~LOC~~
6.0000 mg | PREFILLED_SYRINGE | Freq: Once | SUBCUTANEOUS | Status: AC
  Administered 2014-12-30: 6 mg via SUBCUTANEOUS
  Filled 2014-12-30: qty 0.6

## 2014-12-30 NOTE — Patient Instructions (Signed)
Pegfilgrastim injection What is this medicine? PEGFILGRASTIM (peg fil GRA stim) is a long-acting granulocyte colony-stimulating factor that stimulates the growth of neutrophils, a type of white blood cell important in the body's fight against infection. It is used to reduce the incidence of fever and infection in patients with certain types of cancer who are receiving chemotherapy that affects the bone marrow. This medicine may be used for other purposes; ask your health care provider or pharmacist if you have questions. COMMON BRAND NAME(S): Neulasta What should I tell my health care provider before I take this medicine? They need to know if you have any of these conditions: -latex allergy -ongoing radiation therapy -sickle cell disease -skin reactions to acrylic adhesives (On-Body Injector only) -an unusual or allergic reaction to pegfilgrastim, filgrastim, other medicines, foods, dyes, or preservatives -pregnant or trying to get pregnant -breast-feeding How should I use this medicine? This medicine is for injection under the skin. If you get this medicine at home, you will be taught how to prepare and give the pre-filled syringe or how to use the On-body Injector. Refer to the patient Instructions for Use for detailed instructions. Use exactly as directed. Take your medicine at regular intervals. Do not take your medicine more often than directed. It is important that you put your used needles and syringes in a special sharps container. Do not put them in a trash can. If you do not have a sharps container, call your pharmacist or healthcare provider to get one. Talk to your pediatrician regarding the use of this medicine in children. Special care may be needed. Overdosage: If you think you have taken too much of this medicine contact a poison control center or emergency room at once. NOTE: This medicine is only for you. Do not share this medicine with others. What if I miss a dose? It is  important not to miss your dose. Call your doctor or health care professional if you miss your dose. If you miss a dose due to an On-body Injector failure or leakage, a new dose should be administered as soon as possible using a single prefilled syringe for manual use. What may interact with this medicine? Interactions have not been studied. Give your health care provider a list of all the medicines, herbs, non-prescription drugs, or dietary supplements you use. Also tell them if you smoke, drink alcohol, or use illegal drugs. Some items may interact with your medicine. This list may not describe all possible interactions. Give your health care provider a list of all the medicines, herbs, non-prescription drugs, or dietary supplements you use. Also tell them if you smoke, drink alcohol, or use illegal drugs. Some items may interact with your medicine. What should I watch for while using this medicine? You may need blood work done while you are taking this medicine. If you are going to need a MRI, CT scan, or other procedure, tell your doctor that you are using this medicine (On-Body Injector only). What side effects may I notice from receiving this medicine? Side effects that you should report to your doctor or health care professional as soon as possible: -allergic reactions like skin rash, itching or hives, swelling of the face, lips, or tongue -dizziness -fever -pain, redness, or irritation at site where injected -pinpoint red spots on the skin -shortness of breath or breathing problems -stomach or side pain, or pain at the shoulder -swelling -tiredness -trouble passing urine Side effects that usually do not require medical attention (report to your doctor   or health care professional if they continue or are bothersome): -bone pain -muscle pain This list may not describe all possible side effects. Call your doctor for medical advice about side effects. You may report side effects to FDA at  1-800-FDA-1088. Where should I keep my medicine? Keep out of the reach of children. Store pre-filled syringes in a refrigerator between 2 and 8 degrees C (36 and 46 degrees F). Do not freeze. Keep in carton to protect from light. Throw away this medicine if it is left out of the refrigerator for more than 48 hours. Throw away any unused medicine after the expiration date. NOTE: This sheet is a summary. It may not cover all possible information. If you have questions about this medicine, talk to your doctor, pharmacist, or health care provider.  2015, Elsevier/[employer] Standard. (2013-11-12 16:14:05)  

## 2015-01-04 ENCOUNTER — Other Ambulatory Visit: Payer: 59

## 2015-01-04 ENCOUNTER — Ambulatory Visit: Payer: 59 | Admitting: Internal Medicine

## 2015-01-05 ENCOUNTER — Ambulatory Visit: Payer: 59

## 2015-01-18 ENCOUNTER — Other Ambulatory Visit (HOSPITAL_BASED_OUTPATIENT_CLINIC_OR_DEPARTMENT_OTHER): Payer: 59

## 2015-01-18 ENCOUNTER — Ambulatory Visit: Payer: 59

## 2015-01-18 ENCOUNTER — Other Ambulatory Visit: Payer: 59

## 2015-01-18 ENCOUNTER — Ambulatory Visit (HOSPITAL_BASED_OUTPATIENT_CLINIC_OR_DEPARTMENT_OTHER): Payer: 59

## 2015-01-18 ENCOUNTER — Ambulatory Visit (HOSPITAL_BASED_OUTPATIENT_CLINIC_OR_DEPARTMENT_OTHER): Payer: 59 | Admitting: Physician Assistant

## 2015-01-18 ENCOUNTER — Encounter: Payer: Self-pay | Admitting: Physician Assistant

## 2015-01-18 VITALS — BP 136/72 | HR 77 | Temp 97.9°F | Resp 18 | Ht 62.0 in | Wt 118.5 lb

## 2015-01-18 DIAGNOSIS — M545 Low back pain: Secondary | ICD-10-CM

## 2015-01-18 DIAGNOSIS — G622 Polyneuropathy due to other toxic agents: Secondary | ICD-10-CM

## 2015-01-18 DIAGNOSIS — Z5112 Encounter for antineoplastic immunotherapy: Secondary | ICD-10-CM | POA: Diagnosis not present

## 2015-01-18 DIAGNOSIS — C3431 Malignant neoplasm of lower lobe, right bronchus or lung: Secondary | ICD-10-CM | POA: Diagnosis not present

## 2015-01-18 DIAGNOSIS — C787 Secondary malignant neoplasm of liver and intrahepatic bile duct: Secondary | ICD-10-CM

## 2015-01-18 DIAGNOSIS — C7972 Secondary malignant neoplasm of left adrenal gland: Secondary | ICD-10-CM | POA: Diagnosis not present

## 2015-01-18 DIAGNOSIS — Z95828 Presence of other vascular implants and grafts: Secondary | ICD-10-CM

## 2015-01-18 DIAGNOSIS — Z5111 Encounter for antineoplastic chemotherapy: Secondary | ICD-10-CM | POA: Diagnosis not present

## 2015-01-18 DIAGNOSIS — C3491 Malignant neoplasm of unspecified part of right bronchus or lung: Secondary | ICD-10-CM

## 2015-01-18 DIAGNOSIS — Z79899 Other long term (current) drug therapy: Secondary | ICD-10-CM

## 2015-01-18 DIAGNOSIS — M2550 Pain in unspecified joint: Secondary | ICD-10-CM

## 2015-01-18 DIAGNOSIS — R109 Unspecified abdominal pain: Secondary | ICD-10-CM

## 2015-01-18 LAB — CBC WITH DIFFERENTIAL/PLATELET
BASO%: 0 % (ref 0.0–2.0)
BASOS ABS: 0 10*3/uL (ref 0.0–0.1)
EOS ABS: 0 10*3/uL (ref 0.0–0.5)
EOS%: 0 % (ref 0.0–7.0)
HCT: 32.1 % — ABNORMAL LOW (ref 34.8–46.6)
HGB: 10.7 g/dL — ABNORMAL LOW (ref 11.6–15.9)
LYMPH%: 7.3 % — ABNORMAL LOW (ref 14.0–49.7)
MCH: 31 pg (ref 25.1–34.0)
MCHC: 33.3 g/dL (ref 31.5–36.0)
MCV: 93 fL (ref 79.5–101.0)
MONO#: 0.7 10*3/uL (ref 0.1–0.9)
MONO%: 3.5 % (ref 0.0–14.0)
NEUT#: 18.2 10*3/uL — ABNORMAL HIGH (ref 1.5–6.5)
NEUT%: 89.2 % — AB (ref 38.4–76.8)
PLATELETS: 213 10*3/uL (ref 145–400)
RBC: 3.45 10*6/uL — AB (ref 3.70–5.45)
RDW: 17.9 % — ABNORMAL HIGH (ref 11.2–14.5)
WBC: 20.4 10*3/uL — ABNORMAL HIGH (ref 3.9–10.3)
lymph#: 1.5 10*3/uL (ref 0.9–3.3)

## 2015-01-18 LAB — COMPREHENSIVE METABOLIC PANEL (CC13)
ALK PHOS: 237 U/L — AB (ref 40–150)
ALT: 40 U/L (ref 0–55)
AST: 48 U/L — AB (ref 5–34)
Albumin: 3 g/dL — ABNORMAL LOW (ref 3.5–5.0)
Anion Gap: 13 mEq/L — ABNORMAL HIGH (ref 3–11)
BILIRUBIN TOTAL: 0.35 mg/dL (ref 0.20–1.20)
BUN: 25.7 mg/dL (ref 7.0–26.0)
CHLORIDE: 105 meq/L (ref 98–109)
CO2: 20 mEq/L — ABNORMAL LOW (ref 22–29)
Calcium: 8.9 mg/dL (ref 8.4–10.4)
Creatinine: 1.1 mg/dL (ref 0.6–1.1)
EGFR: 51 mL/min/{1.73_m2} — ABNORMAL LOW (ref >90)
GLUCOSE: 165 mg/dL — AB (ref 70–140)
Potassium: 4.6 mEq/L (ref 3.5–5.1)
Sodium: 138 mEq/L (ref 136–145)
Total Protein: 6.3 g/dL — ABNORMAL LOW (ref 6.4–8.3)

## 2015-01-18 LAB — UA PROTEIN, DIPSTICK - CHCC: Protein, ur: <30 mg/dL

## 2015-01-18 MED ORDER — HEPARIN SOD (PORK) LOCK FLUSH 100 UNIT/ML IV SOLN
500.0000 [IU] | Freq: Once | INTRAVENOUS | Status: DC | PRN
  Filled 2015-01-18: qty 5

## 2015-01-18 MED ORDER — DIPHENHYDRAMINE HCL 25 MG PO CAPS
ORAL_CAPSULE | ORAL | Status: AC
  Filled 2015-01-18: qty 2

## 2015-01-18 MED ORDER — SODIUM CHLORIDE 0.9 % IV SOLN
Freq: Once | INTRAVENOUS | Status: AC
  Administered 2015-01-18: 12:00:00 via INTRAVENOUS
  Filled 2015-01-18: qty 4

## 2015-01-18 MED ORDER — ACETAMINOPHEN 325 MG PO TABS
ORAL_TABLET | ORAL | Status: AC
  Filled 2015-01-18: qty 2

## 2015-01-18 MED ORDER — SODIUM CHLORIDE 0.9 % IV SOLN
Freq: Once | INTRAVENOUS | Status: AC
  Administered 2015-01-18: 12:00:00 via INTRAVENOUS

## 2015-01-18 MED ORDER — ACETAMINOPHEN 325 MG PO TABS
650.0000 mg | ORAL_TABLET | Freq: Once | ORAL | Status: AC
  Administered 2015-01-18: 650 mg via ORAL

## 2015-01-18 MED ORDER — SODIUM CHLORIDE 0.9 % IJ SOLN
10.0000 mL | INTRAMUSCULAR | Status: DC | PRN
  Administered 2015-01-18: 10 mL
  Filled 2015-01-18: qty 10

## 2015-01-18 MED ORDER — DOCETAXEL CHEMO INJECTION 160 MG/16ML
75.0000 mg/m2 | Freq: Once | INTRAVENOUS | Status: AC
  Administered 2015-01-18: 120 mg via INTRAVENOUS
  Filled 2015-01-18: qty 12

## 2015-01-18 MED ORDER — DIPHENHYDRAMINE HCL 50 MG/ML IJ SOLN
INTRAMUSCULAR | Status: AC
  Filled 2015-01-18: qty 1

## 2015-01-18 MED ORDER — DIPHENHYDRAMINE HCL 50 MG/ML IJ SOLN
50.0000 mg | Freq: Once | INTRAMUSCULAR | Status: AC
  Administered 2015-01-18: 50 mg via INTRAVENOUS

## 2015-01-18 MED ORDER — RAMUCIRUMAB CHEMO INJECTION 500 MG/50ML
10.0000 mg/kg | Freq: Once | INTRAVENOUS | Status: AC
  Administered 2015-01-18: 500 mg via INTRAVENOUS
  Filled 2015-01-18: qty 50

## 2015-01-18 MED ORDER — SODIUM CHLORIDE 0.9 % IJ SOLN
10.0000 mL | INTRAMUSCULAR | Status: DC | PRN
  Administered 2015-01-18: 10 mL via INTRAVENOUS
  Filled 2015-01-18: qty 10

## 2015-01-18 NOTE — Patient Instructions (Signed)
[facility] Discharge Instructions for Patients Receiving Chemotherapy  Today you received the following chemotherapy agents Crymza and Taxotere.  To help prevent nausea and vomiting after your treatment, we encourage you to take your nausea medication as prescribed.   If you develop nausea and vomiting that is not controlled by your nausea medication, call the clinic.   BELOW ARE SYMPTOMS THAT SHOULD BE REPORTED IMMEDIATELY:  *FEVER GREATER THAN 100.5 F  *CHILLS WITH OR WITHOUT FEVER  NAUSEA AND VOMITING THAT IS NOT CONTROLLED WITH YOUR NAUSEA MEDICATION  *UNUSUAL SHORTNESS OF BREATH  *UNUSUAL BRUISING OR BLEEDING  TENDERNESS IN MOUTH AND THROAT WITH OR WITHOUT PRESENCE OF ULCERS  *URINARY PROBLEMS  *BOWEL PROBLEMS  UNUSUAL RASH Items with * indicate a potential emergency and should be followed up as soon as possible.  Feel free to call the clinic you have any questions or concerns. The clinic phone number is [phone].  Please show the [facility] at check-in to the Emergency Department and triage nurse.

## 2015-01-18 NOTE — Progress Notes (Addendum)
[facility] [phone]   [phone]  OFFICE PROGRESS NOTE  Mathews Argyle, MD [address] [place] [address]  DIAGNOSIS: Stage IV (T1b, N2, M1b) non-small cell lung cancer, adenocarcinoma, presented with a right lower lobe lung mass as well as mediastinal, hilar, bilateral supraclavicular lymphadenopathy in addition to extensive liver metastases and retroperitoneal lymphadenopathy diagnosed in December of 2014.  MOLECULAR BIOMARKERS: [facility] biomarker testing revealed TP53 E204*, KEAP1 R615f*25+, NFKBIA amplification, NKX2-1 amplification, SMARCA4 E463*. The patient was negative for EGFR and ALK gene translocation   PRIOR THERAPY:  1) Systemic chemotherapy with carboplatin for AUC of 5 and Alimta 500 mg/M2 every 3 weeks, status post 3 cycles. First dose 09/02/2013.  2)  Systemic chemotherapy with cisplatin 60 mg/M2 on days 1 and gemcitabine 800 mg/M2 on days 1 and 8 every 3 weeks, first cycle on 11/12/2013. Status post 12 cycles. Discontinued secondary to hypersensitivity reaction to cisplatin during cycle #12. 3) Systemic chemotherapy with gemcitabine 1000 mg/M2 on days 1 and 8 every 3 weeks, first cycle on 07/29/2014. Status post 3 cycles discontinued today secondary to disease progression. 4)  Immunotherapy with Nivolumab 3 MG/KG every 2 weeks. First cycle 10/05/2014. Status post 4 cycles. Discontinued secondary to disease progression.  CURRENT THERAPY: Systemic chemotherapy with docetaxel 75 mg/m2 and Cyramza 10 mg/kg given every 3 weeks with Neulasta support. Status post 2 cycles.  CHEMOTHERAPY INTENT: palliative  CURRENT # OF CHEMOTHERAPY CYCLES: 3  CURRENT ANTIEMETICS: Zofran, dexamethasone and Compazine.  CURRENT SMOKING STATUS: former smoker  ORAL CHEMOTHERAPY AND CONSENT: None  CURRENT BISPHOSPHONATES USE: None  PAIN MANAGEMENT: 0/10  NARCOTICS INDUCED CONSTIPATION: None  LIVING WILL AND CODE  STATUS: Full Code  INTERVAL HISTORY: Amber [id number].[id number]. female returns to the clinic today for follow up visit accompanied by her husband. She is status post 2 cycles of chemotherapy with docetaxel and Cyramza. She is tolerating this treatment without any significant adverse effects except for joint pains for 6-7 days after the Neulasta injection. Specifically she had no problems with shortness of breath, skin rash or diarrhea. She continues to complain of fatigue as well as right upper quadrant abdominal pain. She continues to have some peripheral neuropathy but has had some improvement. The patient denied having any significant nausea or vomiting. She denied having any fever or chills. She has no significant weight loss or night sweats. She denied having any significant chest pain, shortness of breath, cough or hemoptysis.   MEDICAL HISTORY: Past Medical History  Diagnosis Date  . HTN (hypertension)   . lung ca dx'd 07/2013    ALLERGIES:  is allergic to cisplatin; hctz; and plendil.  MEDICATIONS:  Current Outpatient Prescriptions  Medication Sig Dispense Refill  . amLODipine (NORVASC) 10 MG tablet Take 10 mg by mouth every morning.     .Marland Kitchenaspirin EC 81 MG tablet Take 81 mg by mouth every morning.     . calcium carbonate (TUMS - DOSED IN MG ELEMENTAL CALCIUM) 500 MG chewable tablet Chew 1 tablet by mouth 3 (three) times daily as needed for indigestion or heartburn.     . dexamethasone (DECADRON) 4 MG tablet Take 2 tablets by mouth twice daily with food the day before, the day of, and the day after chemotherapy 50 tablet 1  . DULoxetine (CYMBALTA) 30 MG capsule Take 1 capsule (30 mg total) by mouth daily. 90 capsule 1  . lidocaine-prilocaine (EMLA) cream Apply topically once. 3[place]  g 0  . lisinopril (PRINIVIL,ZESTRIL) 20 MG tablet Take 20 mg by mouth every morning.     . loratadine (CLARITIN) 10 MG tablet Take as directed 30 tablet 1  . nebivolol (BYSTOLIC) 10 MG tablet Take 10 mg by mouth  every morning.     Marland Kitchen omeprazole (PRILOSEC) 40 MG capsule Take 1 capsule (40 mg total) by mouth daily. 90 capsule 1  . oxyCODONE (ROXICODONE) 5 MG immediate release tablet Take 1 tablet (5 mg total) by mouth every 6 (six) hours as needed for severe pain. 60 tablet 0  . prochlorperazine (COMPAZINE) 10 MG tablet Take 1 tablet (10 mg total) by mouth every 6 (six) hours as needed for nausea or vomiting. 90 tablet 1   No current facility-administered medications for this visit.   Facility-Administered Medications Ordered in Other Visits  Medication Dose Route Frequency Provider Last Rate Last Dose  . DOCEtaxel (TAXOTERE) 120 mg in dextrose 5 % 250 mL chemo infusion  75 mg/m2 (Treatment Plan Actual) Intravenous Once Curt Bears, MD 262 mL/hr at 01/18/15 1359 120 mg at 01/18/15 1359  . heparin lock flush 100 unit/mL  500 Units Intracatheter Once PRN Curt Bears, MD      . sodium chloride 0.9 % injection 10 mL  10 mL Intracatheter PRN Curt Bears, MD   10 mL at 11/16/14 1625  . sodium chloride 0.9 % injection 10 mL  10 mL Intracatheter PRN Curt Bears, MD        REVIEW OF SYSTEMS:  Constitutional: positive for fatigue Eyes: negative Ears, nose, mouth, throat, and face: negative Respiratory: negative Cardiovascular: negative Gastrointestinal: negative Genitourinary:negative Integument/breast: negative Hematologic/lymphatic: negative Musculoskeletal:positive for arthralgias, back pain and after the Neulasta injections Neurological: positive for paresthesia Behavioral/Psych: negative Endocrine: negative Allergic/Immunologic: negative   PHYSICAL EXAMINATION: General appearance: alert, cooperative and no distress Head: Normocephalic, without obvious abnormality, atraumatic Neck: no adenopathy, no JVD, supple, symmetrical, trachea midline and thyroid not enlarged, symmetric, no tenderness/mass/nodules Lymph nodes: Cervical, supraclavicular, and axillary nodes normal. Resp: clear to  auscultation bilaterally Back: symmetric, no curvature. ROM normal. No CVA tenderness. Cardio: regular rate and rhythm, S1, S2 normal, no murmur, click, rub or gallop GI: soft, non-tender; bowel sounds normal; no masses,  no organomegaly Extremities: extremities normal, atraumatic, no cyanosis or edema Neurologic: Alert and oriented X 3, normal strength and tone. Normal symmetric reflexes. Normal coordination and gait  ECOG PERFORMANCE STATUS: 1 - Symptomatic but completely ambulatory  Blood pressure 136/72, pulse 77, temperature 97.9 F (36.6 C), temperature source Oral, resp. rate 18, height 5' 2"  (1.575 m), weight 118 lb 8 oz (53.751 kg), SpO2 100 %.  LABORATORY DATA: Lab Results  Component Value Date   WBC 20.4* 01/18/2015   HGB 10.7* 01/18/2015   HCT 32.1* 01/18/2015   MCV 93.0 01/18/2015   PLT 213 01/18/2015      Chemistry      Component Value Date/Time   NA 138 01/18/2015 1004   NA 135 05/13/2014 1007   K 4.6 01/18/2015 1004   K 4.5 05/13/2014 1007   CL 96 05/13/2014 1007   CO2 20* 01/18/2015 1004   CO2 29 05/13/2014 1007   BUN 25.7 01/18/2015 1004   BUN 18 05/13/2014 1007   CREATININE 1.1 01/18/2015 1004   CREATININE 1.00 05/13/2014 1007      Component Value Date/Time   CALCIUM 8.9 01/18/2015 1004   CALCIUM 9.5 05/13/2014 1007       RADIOGRAPHIC STUDIES: No results found. ASSESSMENT AND PLAN:  This is a very pleasant 64 years old white female recently diagnosed with metastatic non-small cell lung cancer, adenocarcinoma presented with right lower lobe lung mass in addition to mediastinal and supraclavicular lymphadenopathy as well as diffuse metastatic liver lesions and retroperitoneal lymphadenopathy. She underwent 3 cycles of systemic chemotherapy with carboplatin and Alimta but unfortunately has evidence for disease progression especially in the liver and new small left adrenal metastasis. She is currently undergoing second line chemotherapy with cisplatin and  gemcitabine status post 12 cycles and tolerating it fairly well patient after reducing the dose of cisplatin. This treatment was discontinued after the patient developed hypersensitivity reaction to cisplatin. The patient also completed 3 cycles of chemotherapy with single agent gemcitabine but unfortunately the recent CT scan of the chest, abdomen and pelvis showed evidence for disease progression especially in the liver. The patient was treated with immunotherapy with single agent Nivolumab 3 MG/KG every 2 weeks is status post 4 cycles, but unfortunately her restaging CT scan revealed evidence for disease progression. She is currently being treated with  Docetaxel 75 mg/m2 and Cyramza 10 mg/kg given every 3 weeks with Neualsta support. She is tolerating her treatment relatively well except for joint and back pain for several days after the Neulasta injections. The patient was discussed with and also seen by Dr. Julien Nordmann. She will proceed with cycle #3 today as scheduled. She will follow up in 3 weeks with a restaging CT scan of the chest, abdomen and pelvis with contrast to re-evaluate her disease.  She was advised to call immediately if she has any concerning symptoms in the interval. The patient voices understanding of current disease status and treatment options and is in agreement with the current care plan.  All questions were answered. The patient knows to call the clinic with any problems, questions or concerns. We can certainly see the patient much sooner if necessary.  Carlton Adam , PA-C 01/18/2015   ADDENDUM: Hematology/Oncology Attending: I had a face to face encounter with the patient. I recommended her care plan.  This is a very pleasant 64 years old white female with metastatic non-small cell lung cancer, adenocarcinoma diagnosed in December 2014 who is currently undergoing systemic chemotherapy with docetaxel and serums and status post 2 cycles. The patient is tolerating her  treatment well except for fatigue and alopecia. She also has some aching pain after the Neulasta injection. I recommended for the patient to proceed with cycle #3 today as a scheduled. She would come back for follow-up visit in 3 weeks for reevaluation after repeating CT scan of the chest, abdomen and pelvis for restaging of her disease. The patient was advised to call immediately if she has any concerning symptoms in the interval.  Disclaimer: This note was dictated with voice recognition software. Similar sounding words can inadvertently be transcribed and may be missed upon review. Eilleen Kempf., MD 01/24/2015

## 2015-01-18 NOTE — Patient Instructions (Signed)

## 2015-01-19 ENCOUNTER — Telehealth: Payer: Self-pay | Admitting: Internal Medicine

## 2015-01-19 ENCOUNTER — Ambulatory Visit: Payer: 59

## 2015-01-19 NOTE — Telephone Encounter (Signed)
lvm for pt regarding to added appt...advised pt to pick up new sched tomorrow

## 2015-01-20 ENCOUNTER — Telehealth: Payer: Self-pay | Admitting: *Deleted

## 2015-01-20 ENCOUNTER — Ambulatory Visit (HOSPITAL_BASED_OUTPATIENT_CLINIC_OR_DEPARTMENT_OTHER): Payer: 59

## 2015-01-20 VITALS — BP 137/71 | HR 78 | Temp 98.2°F | Resp 20

## 2015-01-20 DIAGNOSIS — C3431 Malignant neoplasm of lower lobe, right bronchus or lung: Secondary | ICD-10-CM | POA: Diagnosis not present

## 2015-01-20 DIAGNOSIS — C787 Secondary malignant neoplasm of liver and intrahepatic bile duct: Secondary | ICD-10-CM

## 2015-01-20 DIAGNOSIS — Z5189 Encounter for other specified aftercare: Secondary | ICD-10-CM | POA: Diagnosis not present

## 2015-01-20 DIAGNOSIS — C3491 Malignant neoplasm of unspecified part of right bronchus or lung: Secondary | ICD-10-CM

## 2015-01-20 MED ORDER — PEGFILGRASTIM INJECTION 6 MG/0.6ML ~~LOC~~
6.0000 mg | PREFILLED_SYRINGE | Freq: Once | SUBCUTANEOUS | Status: AC
  Administered 2015-01-20: 6 mg via SUBCUTANEOUS
  Filled 2015-01-20: qty 0.6

## 2015-01-20 NOTE — Telephone Encounter (Signed)
Received phone call from patient's husband asking why patient has labs on weeks in between infusions and if so there needs to be flush appointments added to those days. Sent POF to schedulers to have flush appointments added.  Called patient that labs are needed every week and flush appointments will be added.  Per Dr. Julien Nordmann

## 2015-01-22 NOTE — Patient Instructions (Signed)
Follow up in 3 weeks with a restaging CT scan of your chest, abdomen and pelvis to re-evaluate your disease 

## 2015-01-25 ENCOUNTER — Telehealth: Payer: Self-pay | Admitting: Medical Oncology

## 2015-01-25 ENCOUNTER — Ambulatory Visit (HOSPITAL_BASED_OUTPATIENT_CLINIC_OR_DEPARTMENT_OTHER): Payer: 59

## 2015-01-25 ENCOUNTER — Other Ambulatory Visit (HOSPITAL_BASED_OUTPATIENT_CLINIC_OR_DEPARTMENT_OTHER): Payer: 59

## 2015-01-25 ENCOUNTER — Other Ambulatory Visit: Payer: Self-pay | Admitting: Medical Oncology

## 2015-01-25 VITALS — BP 111/70 | HR 73 | Temp 97.7°F | Resp 18

## 2015-01-25 DIAGNOSIS — C787 Secondary malignant neoplasm of liver and intrahepatic bile duct: Secondary | ICD-10-CM

## 2015-01-25 DIAGNOSIS — C7972 Secondary malignant neoplasm of left adrenal gland: Secondary | ICD-10-CM | POA: Diagnosis not present

## 2015-01-25 DIAGNOSIS — C3431 Malignant neoplasm of lower lobe, right bronchus or lung: Secondary | ICD-10-CM | POA: Diagnosis not present

## 2015-01-25 DIAGNOSIS — Z95828 Presence of other vascular implants and grafts: Secondary | ICD-10-CM

## 2015-01-25 DIAGNOSIS — C3491 Malignant neoplasm of unspecified part of right bronchus or lung: Secondary | ICD-10-CM

## 2015-01-25 LAB — COMPREHENSIVE METABOLIC PANEL (CC13)
ALT: 37 U/L (ref 0–55)
AST: 31 U/L (ref 5–34)
Albumin: 2.6 g/dL — ABNORMAL LOW (ref 3.5–5.0)
Alkaline Phosphatase: 170 U/L — ABNORMAL HIGH (ref 40–150)
Anion Gap: 8 mEq/L (ref 3–11)
BUN: 15 mg/dL (ref 7.0–26.0)
CO2: 23 mEq/L (ref 22–29)
Calcium: 8.2 mg/dL — ABNORMAL LOW (ref 8.4–10.4)
Chloride: 106 mEq/L (ref 98–109)
Creatinine: 0.8 mg/dL (ref 0.6–1.1)
EGFR: 83 mL/min/{1.73_m2} — ABNORMAL LOW (ref >90)
Glucose: 95 mg/dl (ref 70–140)
Potassium: 4.6 mEq/L (ref 3.5–5.1)
Sodium: 137 mEq/L (ref 136–145)
Total Bilirubin: 0.85 mg/dL (ref 0.20–1.20)
Total Protein: 5.6 g/dL — ABNORMAL LOW (ref 6.4–8.3)

## 2015-01-25 LAB — CBC WITH DIFFERENTIAL/PLATELET
BASO%: 0 % (ref 0.0–2.0)
BASOS ABS: 0 10*3/uL (ref 0.0–0.1)
EOS%: 0.4 % (ref 0.0–7.0)
Eosinophils Absolute: 0 10*3/uL (ref 0.0–0.5)
HCT: 29.2 % — ABNORMAL LOW (ref 34.8–46.6)
HGB: 9.6 g/dL — ABNORMAL LOW (ref 11.6–15.9)
LYMPH%: 51.7 % — ABNORMAL HIGH (ref 14.0–49.7)
MCH: 31.1 pg (ref 25.1–34.0)
MCHC: 32.9 g/dL (ref 31.5–36.0)
MCV: 94.5 fL (ref 79.5–101.0)
MONO#: 0.6 10*3/uL (ref 0.1–0.9)
MONO%: 27.6 % — AB (ref 0.0–14.0)
NEUT%: 20.3 % — AB (ref 38.4–76.8)
NEUTROS ABS: 0.5 10*3/uL — AB (ref 1.5–6.5)
Platelets: 55 10*3/uL — ABNORMAL LOW (ref 145–400)
RBC: 3.09 10*6/uL — ABNORMAL LOW (ref 3.70–5.45)
RDW: 17.6 % — AB (ref 11.2–14.5)
WBC: 2.3 10*3/uL — ABNORMAL LOW (ref 3.9–10.3)
lymph#: 1.2 10*3/uL (ref 0.9–3.3)
nRBC: 0 % (ref 0–0)

## 2015-01-25 LAB — TSH CHCC: TSH: 2.273 m[IU]/L (ref 0.308–3.960)

## 2015-01-25 MED ORDER — SODIUM CHLORIDE 0.9 % IJ SOLN
10.0000 mL | INTRAMUSCULAR | Status: DC | PRN
  Administered 2015-01-25: 10 mL via INTRAVENOUS
  Filled 2015-01-25: qty 10

## 2015-01-25 MED ORDER — HEPARIN SOD (PORK) LOCK FLUSH 100 UNIT/ML IV SOLN
500.0000 [IU] | Freq: Once | INTRAVENOUS | Status: AC
  Administered 2015-01-25: 500 [IU] via INTRAVENOUS
  Filled 2015-01-25: qty 5

## 2015-01-25 NOTE — Patient Instructions (Signed)

## 2015-01-25 NOTE — Telephone Encounter (Signed)
I left voice message regarding low ANC and reviewed neutropenic precautions.

## 2015-01-26 ENCOUNTER — Ambulatory Visit: Payer: 59 | Admitting: Internal Medicine

## 2015-01-26 ENCOUNTER — Other Ambulatory Visit: Payer: 59

## 2015-02-01 ENCOUNTER — Ambulatory Visit (HOSPITAL_BASED_OUTPATIENT_CLINIC_OR_DEPARTMENT_OTHER): Payer: 59

## 2015-02-01 ENCOUNTER — Other Ambulatory Visit (HOSPITAL_BASED_OUTPATIENT_CLINIC_OR_DEPARTMENT_OTHER): Payer: 59

## 2015-02-01 VITALS — BP 118/73 | HR 74 | Temp 98.2°F | Resp 18

## 2015-02-01 DIAGNOSIS — C3491 Malignant neoplasm of unspecified part of right bronchus or lung: Secondary | ICD-10-CM

## 2015-02-01 DIAGNOSIS — C3431 Malignant neoplasm of lower lobe, right bronchus or lung: Secondary | ICD-10-CM

## 2015-02-01 DIAGNOSIS — Z452 Encounter for adjustment and management of vascular access device: Secondary | ICD-10-CM | POA: Diagnosis not present

## 2015-02-01 DIAGNOSIS — Z95828 Presence of other vascular implants and grafts: Secondary | ICD-10-CM

## 2015-02-01 LAB — COMPREHENSIVE METABOLIC PANEL (CC13)
ALK PHOS: 219 U/L — AB (ref 40–150)
ALT: 23 U/L (ref 0–55)
ANION GAP: 8 meq/L (ref 3–11)
AST: 35 U/L — AB (ref 5–34)
Albumin: 2.6 g/dL — ABNORMAL LOW (ref 3.5–5.0)
BUN: 14.4 mg/dL (ref 7.0–26.0)
CO2: 24 mEq/L (ref 22–29)
Calcium: 8.9 mg/dL (ref 8.4–10.4)
Chloride: 108 mEq/L (ref 98–109)
Creatinine: 0.8 mg/dL (ref 0.6–1.1)
EGFR: 82 mL/min/{1.73_m2} — AB (ref >90)
Glucose: 102 mg/dl (ref 70–140)
Potassium: 4.5 mEq/L (ref 3.5–5.1)
SODIUM: 140 meq/L (ref 136–145)
Total Bilirubin: 0.45 mg/dL (ref 0.20–1.20)
Total Protein: 5.7 g/dL — ABNORMAL LOW (ref 6.4–8.3)

## 2015-02-01 LAB — CBC WITH DIFFERENTIAL/PLATELET
BASO%: 0 % (ref 0.0–2.0)
Basophils Absolute: 0 10*3/uL (ref 0.0–0.1)
EOS%: 0 % (ref 0.0–7.0)
Eosinophils Absolute: 0 10*3/uL (ref 0.0–0.5)
HCT: 31.7 % — ABNORMAL LOW (ref 34.8–46.6)
HGB: 10.2 g/dL — ABNORMAL LOW (ref 11.6–15.9)
LYMPH%: 10.1 % — AB (ref 14.0–49.7)
MCH: 30.8 pg (ref 25.1–34.0)
MCHC: 32.2 g/dL (ref 31.5–36.0)
MCV: 95.8 fL (ref 79.5–101.0)
MONO#: 1.2 10*3/uL — ABNORMAL HIGH (ref 0.1–0.9)
MONO%: 5.4 % (ref 0.0–14.0)
NEUT#: 18.8 10*3/uL — ABNORMAL HIGH (ref 1.5–6.5)
NEUT%: 84.5 % — ABNORMAL HIGH (ref 38.4–76.8)
PLATELETS: 122 10*3/uL — AB (ref 145–400)
RBC: 3.31 10*6/uL — ABNORMAL LOW (ref 3.70–5.45)
RDW: 18.8 % — AB (ref 11.2–14.5)
WBC: 22.3 10*3/uL — AB (ref 3.9–10.3)
lymph#: 2.3 10*3/uL (ref 0.9–3.3)

## 2015-02-01 MED ORDER — SODIUM CHLORIDE 0.9 % IJ SOLN
10.0000 mL | INTRAMUSCULAR | Status: DC | PRN
  Administered 2015-02-01: 10 mL via INTRAVENOUS
  Filled 2015-02-01: qty 10

## 2015-02-01 MED ORDER — HEPARIN SOD (PORK) LOCK FLUSH 100 UNIT/ML IV SOLN
500.0000 [IU] | Freq: Once | INTRAVENOUS | Status: AC
  Administered 2015-02-01: 500 [IU] via INTRAVENOUS
  Filled 2015-02-01: qty 5

## 2015-02-01 NOTE — Patient Instructions (Signed)

## 2015-02-02 ENCOUNTER — Other Ambulatory Visit: Payer: Self-pay | Admitting: *Deleted

## 2015-02-02 ENCOUNTER — Other Ambulatory Visit: Payer: Self-pay | Admitting: Medical Oncology

## 2015-02-02 ENCOUNTER — Telehealth: Payer: Self-pay | Admitting: Medical Oncology

## 2015-02-02 DIAGNOSIS — C3491 Malignant neoplasm of unspecified part of right bronchus or lung: Secondary | ICD-10-CM

## 2015-02-02 DIAGNOSIS — C3490 Malignant neoplasm of unspecified part of unspecified bronchus or lung: Secondary | ICD-10-CM

## 2015-02-02 MED ORDER — DEXAMETHASONE 4 MG PO TABS: ORAL_TABLET | ORAL | Status: DC

## 2015-02-02 MED ORDER — LIDOCAINE-PRILOCAINE 2.5-2.5 % EX CREA: TOPICAL_CREAM | Freq: Once | CUTANEOUS | Status: AC

## 2015-02-02 MED ORDER — DULOXETINE HCL 30 MG PO CPEP: 30.0000 mg | ORAL_CAPSULE | Freq: Every day | ORAL | Status: DC

## 2015-02-02 NOTE — Telephone Encounter (Signed)
Refills sent to pt pharmacy.

## 2015-02-04 ENCOUNTER — Ambulatory Visit (HOSPITAL_COMMUNITY)
Admission: RE | Admit: 2015-02-04 | Discharge: 2015-02-04 | Disposition: A | Discharge status: Home / Self Care | Payer: 59 | Source: Ambulatory Visit | Attending: Physician Assistant | Admitting: Physician Assistant

## 2015-02-04 ENCOUNTER — Other Ambulatory Visit: Payer: Self-pay | Admitting: Physician Assistant

## 2015-02-04 ENCOUNTER — Encounter (HOSPITAL_COMMUNITY): Payer: Self-pay

## 2015-02-04 ENCOUNTER — Encounter: Payer: Self-pay | Admitting: Physician Assistant

## 2015-02-04 DIAGNOSIS — C3491 Malignant neoplasm of unspecified part of right bronchus or lung: Secondary | ICD-10-CM | POA: Diagnosis present

## 2015-02-04 DIAGNOSIS — R079 Chest pain, unspecified: Secondary | ICD-10-CM | POA: Diagnosis not present

## 2015-02-04 DIAGNOSIS — R05 Cough: Secondary | ICD-10-CM | POA: Insufficient documentation

## 2015-02-04 DIAGNOSIS — Z720 Tobacco use: Secondary | ICD-10-CM | POA: Diagnosis not present

## 2015-02-04 MED ORDER — IOHEXOL 300 MG/ML  SOLN
100.0000 mL | Freq: Once | INTRAMUSCULAR | Status: AC | PRN
  Administered 2015-02-04: 80 mL via INTRAVENOUS

## 2015-02-04 NOTE — Progress Notes (Signed)
Called and informed patient's husband that labs look better and that CT scan will be discussed on Tuesday 6/14.  Patient's husband verbalized understanding.

## 2015-02-07 ENCOUNTER — Other Ambulatory Visit: Payer: Self-pay | Admitting: Physician Assistant

## 2015-02-07 DIAGNOSIS — C3490 Malignant neoplasm of unspecified part of unspecified bronchus or lung: Secondary | ICD-10-CM

## 2015-02-07 MED ORDER — DULOXETINE HCL 30 MG PO CPEP: 30.0000 mg | ORAL_CAPSULE | Freq: Every day | ORAL | Status: AC

## 2015-02-08 ENCOUNTER — Telehealth: Payer: Self-pay | Admitting: Internal Medicine

## 2015-02-08 ENCOUNTER — Other Ambulatory Visit (HOSPITAL_BASED_OUTPATIENT_CLINIC_OR_DEPARTMENT_OTHER): Payer: 59

## 2015-02-08 ENCOUNTER — Ambulatory Visit (HOSPITAL_BASED_OUTPATIENT_CLINIC_OR_DEPARTMENT_OTHER): Payer: 59

## 2015-02-08 ENCOUNTER — Other Ambulatory Visit: Payer: 59

## 2015-02-08 ENCOUNTER — Ambulatory Visit (HOSPITAL_BASED_OUTPATIENT_CLINIC_OR_DEPARTMENT_OTHER): Payer: 59 | Admitting: Physician Assistant

## 2015-02-08 ENCOUNTER — Ambulatory Visit: Payer: 59

## 2015-02-08 ENCOUNTER — Encounter: Payer: Self-pay | Admitting: Physician Assistant

## 2015-02-08 VITALS — BP 127/70 | HR 83 | Temp 97.7°F | Resp 18 | Ht 62.0 in | Wt 127.4 lb

## 2015-02-08 DIAGNOSIS — C3491 Malignant neoplasm of unspecified part of right bronchus or lung: Secondary | ICD-10-CM

## 2015-02-08 DIAGNOSIS — Z5111 Encounter for antineoplastic chemotherapy: Secondary | ICD-10-CM

## 2015-02-08 DIAGNOSIS — Z79899 Other long term (current) drug therapy: Secondary | ICD-10-CM | POA: Diagnosis not present

## 2015-02-08 DIAGNOSIS — Z5112 Encounter for antineoplastic immunotherapy: Secondary | ICD-10-CM

## 2015-02-08 DIAGNOSIS — C3401 Malignant neoplasm of right main bronchus: Secondary | ICD-10-CM

## 2015-02-08 DIAGNOSIS — Z95828 Presence of other vascular implants and grafts: Secondary | ICD-10-CM

## 2015-02-08 DIAGNOSIS — C7972 Secondary malignant neoplasm of left adrenal gland: Secondary | ICD-10-CM | POA: Diagnosis not present

## 2015-02-08 DIAGNOSIS — C3431 Malignant neoplasm of lower lobe, right bronchus or lung: Secondary | ICD-10-CM

## 2015-02-08 DIAGNOSIS — R5383 Other fatigue: Secondary | ICD-10-CM | POA: Diagnosis not present

## 2015-02-08 DIAGNOSIS — C787 Secondary malignant neoplasm of liver and intrahepatic bile duct: Secondary | ICD-10-CM | POA: Diagnosis not present

## 2015-02-08 LAB — COMPREHENSIVE METABOLIC PANEL (CC13)
ALBUMIN: 2.5 g/dL — AB (ref 3.5–5.0)
ALK PHOS: 201 U/L — AB (ref 40–150)
ALT: 26 U/L (ref 0–55)
AST: 38 U/L — ABNORMAL HIGH (ref 5–34)
Anion Gap: 12 mEq/L — ABNORMAL HIGH (ref 3–11)
BUN: 18 mg/dL (ref 7.0–26.0)
CO2: 20 mEq/L — ABNORMAL LOW (ref 22–29)
Calcium: 8.7 mg/dL (ref 8.4–10.4)
Chloride: 108 mEq/L (ref 98–109)
Creatinine: 0.9 mg/dL (ref 0.6–1.1)
EGFR: 64 mL/min/{1.73_m2} — AB (ref >90)
GLUCOSE: 169 mg/dL — AB (ref 70–140)
POTASSIUM: 4.4 meq/L (ref 3.5–5.1)
Sodium: 140 mEq/L (ref 136–145)
TOTAL PROTEIN: 5.6 g/dL — AB (ref 6.4–8.3)
Total Bilirubin: 0.41 mg/dL (ref 0.20–1.20)

## 2015-02-08 LAB — CBC WITH DIFFERENTIAL/PLATELET
BASO%: 0 % (ref 0.0–2.0)
BASOS ABS: 0 10*3/uL (ref 0.0–0.1)
EOS%: 0 % (ref 0.0–7.0)
Eosinophils Absolute: 0 10*3/uL (ref 0.0–0.5)
HCT: 31.2 % — ABNORMAL LOW (ref 34.8–46.6)
HEMOGLOBIN: 10.3 g/dL — AB (ref 11.6–15.9)
LYMPH%: 8.3 % — ABNORMAL LOW (ref 14.0–49.7)
MCH: 31.8 pg (ref 25.1–34.0)
MCHC: 33 g/dL (ref 31.5–36.0)
MCV: 96.3 fL (ref 79.5–101.0)
MONO#: 0.7 10*3/uL (ref 0.1–0.9)
MONO%: 3.2 % (ref 0.0–14.0)
NEUT#: 18.9 10*3/uL — ABNORMAL HIGH (ref 1.5–6.5)
NEUT%: 88.5 % — ABNORMAL HIGH (ref 38.4–76.8)
Platelets: 190 10*3/uL (ref 145–400)
RBC: 3.24 10*6/uL — AB (ref 3.70–5.45)
RDW: 19.1 % — AB (ref 11.2–14.5)
WBC: 21.3 10*3/uL — ABNORMAL HIGH (ref 3.9–10.3)
lymph#: 1.8 10*3/uL (ref 0.9–3.3)

## 2015-02-08 LAB — UA PROTEIN, DIPSTICK - CHCC

## 2015-02-08 MED ORDER — DIPHENHYDRAMINE HCL 50 MG/ML IJ SOLN
INTRAMUSCULAR | Status: AC
  Filled 2015-02-08: qty 1

## 2015-02-08 MED ORDER — ACETAMINOPHEN 325 MG PO TABS
650.0000 mg | ORAL_TABLET | Freq: Once | ORAL | Status: AC
  Administered 2015-02-08: 650 mg via ORAL

## 2015-02-08 MED ORDER — SODIUM CHLORIDE 0.9 % IJ SOLN
10.0000 mL | INTRAMUSCULAR | Status: DC | PRN
  Administered 2015-02-08: 10 mL via INTRAVENOUS
  Filled 2015-02-08: qty 10

## 2015-02-08 MED ORDER — DOCETAXEL CHEMO INJECTION 160 MG/16ML
75.0000 mg/m2 | Freq: Once | INTRAVENOUS | Status: AC
  Administered 2015-02-08: 120 mg via INTRAVENOUS
  Filled 2015-02-08: qty 12

## 2015-02-08 MED ORDER — HEPARIN SOD (PORK) LOCK FLUSH 100 UNIT/ML IV SOLN
500.0000 [IU] | Freq: Once | INTRAVENOUS | Status: AC | PRN
  Administered 2015-02-08: 500 [IU]
  Filled 2015-02-08: qty 5

## 2015-02-08 MED ORDER — SODIUM CHLORIDE 0.9 % IV SOLN
Freq: Once | INTRAVENOUS | Status: AC
  Administered 2015-02-08: 14:00:00 via INTRAVENOUS
  Filled 2015-02-08: qty 4

## 2015-02-08 MED ORDER — DIPHENHYDRAMINE HCL 50 MG/ML IJ SOLN
50.0000 mg | Freq: Once | INTRAMUSCULAR | Status: AC
  Administered 2015-02-08: 50 mg via INTRAVENOUS

## 2015-02-08 MED ORDER — SODIUM CHLORIDE 0.9 % IJ SOLN
10.0000 mL | INTRAMUSCULAR | Status: DC | PRN
  Administered 2015-02-08: 10 mL
  Filled 2015-02-08: qty 10

## 2015-02-08 MED ORDER — SODIUM CHLORIDE 0.9 % IV SOLN
10.0000 mg/kg | Freq: Once | INTRAVENOUS | Status: AC
  Administered 2015-02-08: 500 mg via INTRAVENOUS
  Filled 2015-02-08: qty 50

## 2015-02-08 MED ORDER — SODIUM CHLORIDE 0.9 % IV SOLN
250.0000 mL | Freq: Once | INTRAVENOUS | Status: AC
  Administered 2015-02-08: 250 mL via INTRAVENOUS

## 2015-02-08 MED ORDER — ACETAMINOPHEN 325 MG PO TABS
ORAL_TABLET | ORAL | Status: AC
  Filled 2015-02-08: qty 2

## 2015-02-08 NOTE — Patient Instructions (Signed)

## 2015-02-08 NOTE — Patient Instructions (Signed)
[facility] Discharge Instructions for Patients Receiving Chemotherapy . Today you received the following chemotherapy agents Cyramza/Docetaxel.  To help prevent nausea and vomiting after your treatment, we encourage you to take your nausea medication as directed.    If you develop nausea and vomiting that is not controlled by your nausea medication, call the clinic.   BELOW ARE SYMPTOMS THAT SHOULD BE REPORTED IMMEDIATELY:  *FEVER GREATER THAN 100.5 F  *CHILLS WITH OR WITHOUT FEVER  NAUSEA AND VOMITING THAT IS NOT CONTROLLED WITH YOUR NAUSEA MEDICATION  *UNUSUAL SHORTNESS OF BREATH  *UNUSUAL BRUISING OR BLEEDING  TENDERNESS IN MOUTH AND THROAT WITH OR WITHOUT PRESENCE OF ULCERS  *URINARY PROBLEMS  *BOWEL PROBLEMS  UNUSUAL RASH Items with * indicate a potential emergency and should be followed up as soon as possible.  Feel free to call the clinic you have any questions or concerns. The clinic phone number is [phone].  Please show the [facility] at check-in to the Emergency Department and triage nurse.

## 2015-02-08 NOTE — Telephone Encounter (Signed)
Gave and printed appt sched and avs for pt for June and July  °

## 2015-02-08 NOTE — Progress Notes (Addendum)
[place] [phone]   [phone]  OFFICE PROGRESS NOTE  Mathews Argyle, MD [address] [place] [address]  DIAGNOSIS: Stage IV (T1b, N2, M1b) non-small cell lung cancer, adenocarcinoma, presented with a right lower lobe lung mass as well as mediastinal, hilar, bilateral supraclavicular lymphadenopathy in addition to extensive liver metastases and retroperitoneal lymphadenopathy diagnosed in December of 2014.  MOLECULAR BIOMARKERS: [facility] ONE biomarker testing revealed TP53 E204*, KEAP1 R643f*25+, NFKBIA amplification, NKX2-1 amplification, SMARCA4 E463*. The patient was negative for EGFR and ALK gene translocation   PRIOR THERAPY:  1) Systemic chemotherapy with carboplatin for AUC of 5 and Alimta 500 mg/M2 every 3 weeks, status post 3 cycles. First dose 09/02/2013.  2)  Systemic chemotherapy with cisplatin 60 mg/M2 on days 1 and gemcitabine 800 mg/M2 on days 1 and 8 every 3 weeks, first cycle on 11/12/2013. Status post 12 cycles. Discontinued secondary to hypersensitivity reaction to cisplatin during cycle #12. 3) Systemic chemotherapy with gemcitabine 1000 mg/M2 on days 1 and 8 every 3 weeks, first cycle on 07/29/2014. Status post 3 cycles discontinued today secondary to disease progression. 4)  Immunotherapy with Nivolumab 3 MG/KG every 2 weeks. First cycle 10/05/2014. Status post 4 cycles. Discontinued secondary to disease progression.  CURRENT THERAPY: Systemic chemotherapy with docetaxel 75 mg/m2 and Cyramza 10 mg/kg given every 3 weeks with Neulasta support. Status post 3 cycles.  CHEMOTHERAPY INTENT: palliative  CURRENT # OF CHEMOTHERAPY CYCLES: 4  CURRENT ANTIEMETICS: Zofran, dexamethasone and Compazine.  CURRENT SMOKING STATUS: former smoker  ORAL CHEMOTHERAPY AND CONSENT: None  CURRENT BISPHOSPHONATES USE: None  PAIN MANAGEMENT: 0/10  NARCOTICS INDUCED CONSTIPATION: None  LIVING WILL AND CODE  STATUS: Full Code  INTERVAL HISTORY: JHAPPY BEGEMAN64y.o. female returns to the clinic today for follow up visit accompanied by her husband. She is status post 3 cycles of chemotherapy with docetaxel and Cyramza. She is tolerating this treatment without any significant adverse effects except for joint pains for 6-7 days after the Neulasta injection. Specifically she had no problems with shortness of breath, skin rash or diarrhea. She continues to complain of fatigue. She notes decreased  right upper quadrant abdominal pain. She continues to have some peripheral neuropathy but has had some improvement on the Cymbalta. She has developed some "pressure sores" in the medial knee area bilaterally. The skin has become hyperpigmented and dry. She is been sleeping with a pillow between her knees for the past couple of weeks and this has helped. Overall her skin is generally dry everywhere. The patient denied having any significant nausea or vomiting. She denied having any fever or chills. She has no significant weight loss or night sweats. She denied having any significant chest pain, shortness of breath, cough or hemoptysis. She recently had a restaging CT scan of the chest, abdomen and pelvis and presents to discuss the results.  MEDICAL HISTORY: Past Medical History  Diagnosis Date  . HTN (hypertension)   . lung ca dx'd 07/2013    ALLERGIES:  is allergic to cisplatin; hctz; and plendil.  MEDICATIONS:  Current Outpatient Prescriptions  Medication Sig Dispense Refill  . amLODipine (NORVASC) 5 MG tablet     . dexamethasone (DECADRON) 4 MG tablet Take 2 tablets by mouth twice daily with food the day before, the day of, and the day after chemotherapy 50 tablet 0  . DULoxetine (CYMBALTA) 30 MG capsule Take 1 capsule (30 mg total) by mouth daily. 9[place]  capsule 1  . lidocaine-prilocaine (EMLA) cream Apply topically once. 30 g 0  . lisinopril (PRINIVIL,ZESTRIL) 20 MG tablet Take 20 mg by mouth every morning.       . loratadine (CLARITIN) 10 MG tablet Take as directed 30 tablet 1  . nebivolol (BYSTOLIC) 10 MG tablet Take 10 mg by mouth every morning.     Marland Kitchen omeprazole (PRILOSEC) 40 MG capsule Take 1 capsule (40 mg total) by mouth daily. 90 capsule 1  . oxyCODONE (ROXICODONE) 5 MG immediate release tablet Take 1 tablet (5 mg total) by mouth every 6 (six) hours as needed for severe pain. 60 tablet 0  . prochlorperazine (COMPAZINE) 10 MG tablet Take 1 tablet (10 mg total) by mouth every 6 (six) hours as needed for nausea or vomiting. 90 tablet 1  . aspirin EC 81 MG tablet Take 81 mg by mouth every morning.     . calcium carbonate (TUMS - DOSED IN MG ELEMENTAL CALCIUM) 500 MG chewable tablet Chew 1 tablet by mouth 3 (three) times daily as needed for indigestion or heartburn.      No current facility-administered medications for this visit.   Facility-Administered Medications Ordered in Other Visits  Medication Dose Route Frequency Provider Last Rate Last Dose  . sodium chloride 0.9 % injection 10 mL  10 mL Intracatheter PRN Curt Bears, MD   10 mL at 11/16/14 1625  . sodium chloride 0.9 % injection 10 mL  10 mL Intracatheter PRN Curt Bears, MD   10 mL at 02/08/15 1608    REVIEW OF SYSTEMS:  Constitutional: positive for fatigue Eyes: negative Ears, nose, mouth, throat, and face: positive for hearing loss Respiratory: negative Cardiovascular: negative Gastrointestinal: positive for abdominal pain and Right upper quadrant and decreased Genitourinary:negative Integument/breast: positive for skin lesion(s) Hematologic/lymphatic: negative Musculoskeletal:positive for arthralgias, back pain and after the Neulasta injections Neurological: positive for paresthesia Behavioral/Psych: negative Endocrine: negative Allergic/Immunologic: negative   PHYSICAL EXAMINATION: General appearance: alert, cooperative and no distress Head: Normocephalic, without obvious abnormality, atraumatic Neck: no  adenopathy, no JVD, supple, symmetrical, trachea midline and thyroid not enlarged, symmetric, no tenderness/mass/nodules Lymph nodes: Cervical, supraclavicular, and axillary nodes normal. Resp: clear to auscultation bilaterally Back: symmetric, no curvature. ROM normal. No CVA tenderness. Cardio: regular rate and rhythm, S1, S2 normal, no murmur, click, rub or gallop GI: soft, non-tender; bowel sounds normal; no masses,  no organomegaly Extremities: extremities normal, atraumatic, no cyanosis or edema Neurologic: Alert and oriented X 3, normal strength and tone. Normal symmetric reflexes. Normal coordination and gait Skin: Approximately 1-1/2-2 cm ovoid, hyperpigmented, dry skin lesions in the region of the medial condyle of the femur bilaterally. There is some dryness and peeling but no bleeding or serosanguineous oozing. Overall the skin on the body is generally dry and peeling.  ECOG PERFORMANCE STATUS: 1 - Symptomatic but completely ambulatory  Blood pressure 127/70, pulse 83, temperature 97.7 F (36.5 C), temperature source Oral, resp. rate 18, height 5' 2" (1.575 m), weight 127 lb 6.4 oz (57.788 kg), SpO2 98 %.  LABORATORY DATA: Lab Results  Component Value Date   WBC 21.3* 02/08/2015   HGB 10.3* 02/08/2015   HCT 31.2* 02/08/2015   MCV 96.3 02/08/2015   PLT 190 02/08/2015      Chemistry      Component Value Date/Time   NA 140 02/08/2015 1136   NA 135 05/13/2014 1007   K 4.4 02/08/2015 1136   K 4.5 05/13/2014 1007   CL 96 05/13/2014 1007  CO2 20* 02/08/2015 1136   CO2 29 05/13/2014 1007   BUN 18.0 02/08/2015 1136   BUN 18 05/13/2014 1007   CREATININE 0.9 02/08/2015 1136   CREATININE 1.00 05/13/2014 1007      Component Value Date/Time   CALCIUM 8.7 02/08/2015 1136   CALCIUM 9.5 05/13/2014 1007       RADIOGRAPHIC STUDIES: Ct Chest W Contrast  02/04/2015   CLINICAL DATA:  Subsequent encounter for non-small-cell lung cancer. Chest pain with cough.  EXAM: CT CHEST,  ABDOMEN, AND PELVIS WITH CONTRAST  TECHNIQUE: Multidetector CT imaging of the chest, abdomen and pelvis was performed following the standard protocol during bolus administration of intravenous contrast.  CONTRAST:  32m OMNIPAQUE IOHEXOL 300 MG/ML  SOLN  COMPARISON:  11/29/2014  FINDINGS: CT CHEST FINDINGS  Mediastinum/Nodes: The tip of the right-sided Port-A-Cath is positioned in the mid right atrium. There is no axillary lymphadenopathy. No mediastinal or hilar lymphadenopathy. Heart size is normal. No pericardial effusion.  Lungs/Pleura: Stable volume loss in the right hemi thorax. Underlying changes of centrilobular emphysema again noted. Central right lower lobe spiculated lesion measures 2.0 x 2.5 cm today compared to 2.7 x 2.7 cm previously. Small satellite nodule seen previously have resolved in the interval. Subsegmental atelectasis in the right lower lobe is again noted. Small right pleural effusion has progressed in the interval.  Musculoskeletal: Bone windows reveal no worrisome lytic or sclerotic osseous lesions.  CT ABDOMEN AND PELVIS FINDINGS  Hepatobiliary: Numerous liver metastases are again noted. Index lesion in the medial segment left liver measured previously at 2.9 x 2.9 cm now measures 2.2 x 2.0 cm. A second lesion in the inferior left liver measures 4.9 x 3.5 cm today compared to 5.8 x 4.7 cm when I remeasure it on the previous exam. No evidence gallstones. No intrahepatic or extrahepatic biliary dilation.  Pancreas: No focal mass lesion. No dilatation of the main duct. No intraparenchymal cyst. No peripancreatic edema.  Spleen: No splenomegaly. No focal mass lesion.  Adrenals/Urinary Tract: No adrenal nodule or mass. No enhancing lesion seen in either kidney. No hydronephrosis. No evidence for hydroureter. Urinary bladder is unremarkable.  Stomach/Bowel: Small hiatal hernia noted. Stomach is nondistended. Small duodenum diverticulum is evident. No evidence for small bowel obstruction.  Terminal ileum is normal. The appendix is not visualized, but there is no edema or inflammation in the region of the cecum. No gross colonic mass. No colonic wall thickening. No substantial diverticular change.  Vascular/Lymphatic: There is abdominal aortic atherosclerosis without aneurysm. Scattered lymph nodes are seen in the upper abdomen without overt lymphadenopathy in the abdomen. No pelvic sidewall lymphadenopathy.  Reproductive: Large exophytic uterine fibroid which was seen posteriorly in the right adnexal space on the previous study is now in the anterior right adnexal space. Other uterine fibroids are evident. No evidence for adnexal mass.  Other: There is mild to moderate ascites in the abdomen and pelvis, clearly progressed in the interval.  Musculoskeletal: Bone windows reveal no worrisome lytic or sclerotic osseous lesions.  IMPRESSION: 1. Interval decrease in size of the central right lower lobe pulmonary nodule. Multiple satellite nodule seen in this region previously has resolved in the interval. 2. Interval decrease in size of multiple bulky hepatic metastases. 3. No evidence for abdominal or pelvic lymphadenopathy. 4. Interval progression of ascites and right pleural effusion.   Electronically Signed   By: EMisty StanleyM.D.   On: 02/04/2015 12:54   Ct Abdomen Pelvis W Contrast  02/04/2015   CLINICAL  DATA:  Subsequent encounter for non-small-cell lung cancer. Chest pain with cough.  EXAM: CT CHEST, ABDOMEN, AND PELVIS WITH CONTRAST  TECHNIQUE: Multidetector CT imaging of the chest, abdomen and pelvis was performed following the standard protocol during bolus administration of intravenous contrast.  CONTRAST:  59m OMNIPAQUE IOHEXOL 300 MG/ML  SOLN  COMPARISON:  11/29/2014  FINDINGS: CT CHEST FINDINGS  Mediastinum/Nodes: The tip of the right-sided Port-A-Cath is positioned in the mid right atrium. There is no axillary lymphadenopathy. No mediastinal or hilar lymphadenopathy. Heart size is  normal. No pericardial effusion.  Lungs/Pleura: Stable volume loss in the right hemi thorax. Underlying changes of centrilobular emphysema again noted. Central right lower lobe spiculated lesion measures 2.0 x 2.5 cm today compared to 2.7 x 2.7 cm previously. Small satellite nodule seen previously have resolved in the interval. Subsegmental atelectasis in the right lower lobe is again noted. Small right pleural effusion has progressed in the interval.  Musculoskeletal: Bone windows reveal no worrisome lytic or sclerotic osseous lesions.  CT ABDOMEN AND PELVIS FINDINGS  Hepatobiliary: Numerous liver metastases are again noted. Index lesion in the medial segment left liver measured previously at 2.9 x 2.9 cm now measures 2.2 x 2.0 cm. A second lesion in the inferior left liver measures 4.9 x 3.5 cm today compared to 5.8 x 4.7 cm when I remeasure it on the previous exam. No evidence gallstones. No intrahepatic or extrahepatic biliary dilation.  Pancreas: No focal mass lesion. No dilatation of the main duct. No intraparenchymal cyst. No peripancreatic edema.  Spleen: No splenomegaly. No focal mass lesion.  Adrenals/Urinary Tract: No adrenal nodule or mass. No enhancing lesion seen in either kidney. No hydronephrosis. No evidence for hydroureter. Urinary bladder is unremarkable.  Stomach/Bowel: Small hiatal hernia noted. Stomach is nondistended. Small duodenum diverticulum is evident. No evidence for small bowel obstruction. Terminal ileum is normal. The appendix is not visualized, but there is no edema or inflammation in the region of the cecum. No gross colonic mass. No colonic wall thickening. No substantial diverticular change.  Vascular/Lymphatic: There is abdominal aortic atherosclerosis without aneurysm. Scattered lymph nodes are seen in the upper abdomen without overt lymphadenopathy in the abdomen. No pelvic sidewall lymphadenopathy.  Reproductive: Large exophytic uterine fibroid which was seen posteriorly in  the right adnexal space on the previous study is now in the anterior right adnexal space. Other uterine fibroids are evident. No evidence for adnexal mass.  Other: There is mild to moderate ascites in the abdomen and pelvis, clearly progressed in the interval.  Musculoskeletal: Bone windows reveal no worrisome lytic or sclerotic osseous lesions.  IMPRESSION: 1. Interval decrease in size of the central right lower lobe pulmonary nodule. Multiple satellite nodule seen in this region previously has resolved in the interval. 2. Interval decrease in size of multiple bulky hepatic metastases. 3. No evidence for abdominal or pelvic lymphadenopathy. 4. Interval progression of ascites and right pleural effusion.   Electronically Signed   By: EMisty StanleyM.D.   On: 02/04/2015 12:54   ASSESSMENT AND PLAN: This is a very pleasant 64years old white female recently diagnosed with metastatic non-small cell lung cancer, adenocarcinoma presented with right lower lobe lung mass in addition to mediastinal and supraclavicular lymphadenopathy as well as diffuse metastatic liver lesions and retroperitoneal lymphadenopathy. She underwent 3 cycles of systemic chemotherapy with carboplatin and Alimta but unfortunately has evidence for disease progression especially in the liver and new small left adrenal metastasis. She underwent second line chemotherapy with cisplatin  and gemcitabine status post 12 cycles and tolerated it fairly well patient after reducing the dose of cisplatin. This treatment was discontinued after the patient developed hypersensitivity reaction to cisplatin. The patient also completed 3 cycles of chemotherapy with single agent gemcitabine but unfortunately the recent CT scan of the chest, abdomen and pelvis showed evidence for disease progression especially in the liver. The patient was treated with immunotherapy with single agent Nivolumab 3 MG/KG every 2 weeks is status post 4 cycles, but unfortunately her  restaging CT scan revealed evidence for disease progression. She is currently being treated with  Docetaxel 75 mg/m2 and Cyramza 10 mg/kg given every 3 weeks with Neualsta support. She is tolerating her treatment relatively well except for joint and back pain for several days after the Neulasta injections. She status post 3 cycles. Her recent restaging CT scan revealed interval on decrease in the size of her central right lower lobe pulmonary nodule as well as decrease in the size of her multiple bulky hepatic metastasis. There was some increase in some ascites and right pleural effusion and we will continue to monitor this on subsequent restaging CT scans. The patient was discussed with and also seen by Dr. Julien Nordmann. She will proceed with cycle #4 today as scheduled. She will follow up in 3 weeks prior to the start of cycle #5. She was advised to continue to sleep with a pillow been between her knees to decrease the pressure in this area and to also liberally apply extra moisturizing topical emollients  She was advised to call immediately if she has any concerning symptoms in the interval. The patient voices understanding of current disease status and treatment options and is in agreement with the current care plan.  All questions were answered. The patient knows to call the clinic with any problems, questions or concerns. We can certainly see the patient much sooner if necessary.  Carlton Adam , PA-C 02/08/2015   ADDENDUM: Hematology/Oncology Attending: I had a face to face encounter with the patient. I recommended her care plan. This is a very pleasant 64 years old white female with a stage IV non-small cell lung cancer, adenocarcinoma who is currently undergoing systemic chemotherapy with docetaxel and Cyramza and status post 3 cycles. The patient is tolerating her treatment well except for fatigue. The recent CT scan of the chest, abdomen and pelvis showed improvement in her disease. I  discussed the scan results with the patient and her husband. I recommended for her to continue her current treatment with docetaxel and Cyramza. She will proceed with cycle #4 today. The patient would come back for follow-up visit in 3 weeks for reevaluation with the start of cycle #5. She was advised to call immediately if she has any concerning symptoms in the interval.  Disclaimer: This note was dictated with voice recognition software. Similar sounding words can inadvertently be transcribed and may be missed upon review. Eilleen Kempf., MD 02/12/2015

## 2015-02-10 ENCOUNTER — Ambulatory Visit (HOSPITAL_BASED_OUTPATIENT_CLINIC_OR_DEPARTMENT_OTHER): Payer: 59

## 2015-02-10 VITALS — BP 116/59 | HR 80 | Temp 98.1°F

## 2015-02-10 DIAGNOSIS — C787 Secondary malignant neoplasm of liver and intrahepatic bile duct: Secondary | ICD-10-CM | POA: Diagnosis not present

## 2015-02-10 DIAGNOSIS — Z5189 Encounter for other specified aftercare: Secondary | ICD-10-CM

## 2015-02-10 DIAGNOSIS — C3431 Malignant neoplasm of lower lobe, right bronchus or lung: Secondary | ICD-10-CM

## 2015-02-10 DIAGNOSIS — C3491 Malignant neoplasm of unspecified part of right bronchus or lung: Secondary | ICD-10-CM

## 2015-02-10 MED ORDER — PEGFILGRASTIM INJECTION 6 MG/0.6ML ~~LOC~~
6.0000 mg | PREFILLED_SYRINGE | Freq: Once | SUBCUTANEOUS | Status: AC
  Administered 2015-02-10: 6 mg via SUBCUTANEOUS
  Filled 2015-02-10: qty 0.6

## 2015-02-11 NOTE — Patient Instructions (Signed)
Your restaging CT scan showed improvement in your disease Continue with labs and chemotherapy as scheduled Follow-up in 3 weeks, prior to the start of your next scheduled cycle of chemotherapy

## 2015-02-15 ENCOUNTER — Telehealth: Payer: Self-pay | Admitting: *Deleted

## 2015-02-15 ENCOUNTER — Other Ambulatory Visit: Payer: 59

## 2015-02-15 ENCOUNTER — Telehealth: Payer: Self-pay | Admitting: Internal Medicine

## 2015-02-15 NOTE — Telephone Encounter (Signed)
Received phone call from patient's husband stating that he moved patient's lab appointment form today 6/21 to 6/23.  Per husband patient not feeling well and has been in bed for 4 days, patient is also not eating or drinking much through out the day.  Patient's husband noticed dried blood in the corner of patient's mouth but did not see any cuts and states that patient has not been coughing up blood.  Patient's husband wanting to know if Dr. Julien Nordmann or Awilda Metro, PA would like to see patient Thursday after labs.  Per Awilda Metro, PA add patient to schedule at [phone].  POF sent to schedulers.

## 2015-02-15 NOTE — Telephone Encounter (Signed)
s.w. pt husband againg and advised on 6.23 time change and added PA visit...ok and aware

## 2015-02-15 NOTE — Telephone Encounter (Signed)
pt husband called to r/s appt due to pt not feeling well...done...Amber Hayden is aware of new d.t

## 2015-02-17 ENCOUNTER — Ambulatory Visit (HOSPITAL_COMMUNITY)
Admission: RE | Admit: 2015-02-17 | Discharge: 2015-02-17 | Disposition: A | Discharge status: Home / Self Care | Payer: 59 | Source: Ambulatory Visit | Attending: Physician Assistant | Admitting: Physician Assistant

## 2015-02-17 ENCOUNTER — Ambulatory Visit (HOSPITAL_BASED_OUTPATIENT_CLINIC_OR_DEPARTMENT_OTHER): Payer: 59 | Admitting: Physician Assistant

## 2015-02-17 ENCOUNTER — Other Ambulatory Visit (HOSPITAL_BASED_OUTPATIENT_CLINIC_OR_DEPARTMENT_OTHER): Payer: 59

## 2015-02-17 ENCOUNTER — Ambulatory Visit (HOSPITAL_BASED_OUTPATIENT_CLINIC_OR_DEPARTMENT_OTHER): Payer: 59

## 2015-02-17 ENCOUNTER — Other Ambulatory Visit: Payer: 59

## 2015-02-17 VITALS — BP 100/62 | HR 83 | Temp 97.9°F | Resp 18 | Ht 62.0 in | Wt 123.1 lb

## 2015-02-17 VITALS — BP 108/69 | HR 79 | Temp 97.8°F | Resp 20

## 2015-02-17 DIAGNOSIS — E860 Dehydration: Secondary | ICD-10-CM | POA: Diagnosis not present

## 2015-02-17 DIAGNOSIS — C3431 Malignant neoplasm of lower lobe, right bronchus or lung: Secondary | ICD-10-CM

## 2015-02-17 DIAGNOSIS — R1030 Lower abdominal pain, unspecified: Secondary | ICD-10-CM

## 2015-02-17 DIAGNOSIS — C3491 Malignant neoplasm of unspecified part of right bronchus or lung: Secondary | ICD-10-CM

## 2015-02-17 DIAGNOSIS — C3401 Malignant neoplasm of right main bronchus: Secondary | ICD-10-CM

## 2015-02-17 DIAGNOSIS — Z95828 Presence of other vascular implants and grafts: Secondary | ICD-10-CM

## 2015-02-17 LAB — CBC WITH DIFFERENTIAL/PLATELET
BASO%: 0.2 % (ref 0.0–2.0)
Basophils Absolute: 0 10*3/uL (ref 0.0–0.1)
EOS ABS: 0 10*3/uL (ref 0.0–0.5)
EOS%: 0 % (ref 0.0–7.0)
HCT: 27.5 % — ABNORMAL LOW (ref 34.8–46.6)
HEMOGLOBIN: 9.1 g/dL — AB (ref 11.6–15.9)
LYMPH%: 11.3 % — AB (ref 14.0–49.7)
MCH: 32 pg (ref 25.1–34.0)
MCHC: 33.1 g/dL (ref 31.5–36.0)
MCV: 96.8 fL (ref 79.5–101.0)
MONO#: 2.3 10*3/uL — AB (ref 0.1–0.9)
MONO%: 9.9 % (ref 0.0–14.0)
NEUT#: 18.2 10*3/uL — ABNORMAL HIGH (ref 1.5–6.5)
NEUT%: 78.6 % — AB (ref 38.4–76.8)
Platelets: 53 10*3/uL — ABNORMAL LOW (ref 145–400)
RBC: 2.84 10*6/uL — AB (ref 3.70–5.45)
RDW: 18 % — ABNORMAL HIGH (ref 11.2–14.5)
WBC: 23.2 10*3/uL — ABNORMAL HIGH (ref 3.9–10.3)
lymph#: 2.6 10*3/uL (ref 0.9–3.3)
nRBC: 3 % — ABNORMAL HIGH (ref 0–0)

## 2015-02-17 LAB — COMPREHENSIVE METABOLIC PANEL (CC13)
ALT: 22 U/L (ref 0–55)
ANION GAP: 6 meq/L (ref 3–11)
AST: 29 U/L (ref 5–34)
Albumin: 2.5 g/dL — ABNORMAL LOW (ref 3.5–5.0)
Alkaline Phosphatase: 184 U/L — ABNORMAL HIGH (ref 40–150)
BUN: 12.6 mg/dL (ref 7.0–26.0)
CO2: 25 meq/L (ref 22–29)
CREATININE: 0.8 mg/dL (ref 0.6–1.1)
Calcium: 8.1 mg/dL — ABNORMAL LOW (ref 8.4–10.4)
Chloride: 107 mEq/L (ref 98–109)
EGFR: 76 mL/min/{1.73_m2} — ABNORMAL LOW (ref >90)
GLUCOSE: 111 mg/dL (ref 70–140)
Potassium: 3.9 mEq/L (ref 3.5–5.1)
SODIUM: 138 meq/L (ref 136–145)
TOTAL PROTEIN: 5.1 g/dL — AB (ref 6.4–8.3)
Total Bilirubin: 0.84 mg/dL (ref 0.20–1.20)

## 2015-02-17 MED ORDER — SODIUM CHLORIDE 0.9 % IJ SOLN
10.0000 mL | Freq: Once | INTRAMUSCULAR | Status: AC
  Administered 2015-02-17: 10 mL via INTRAVENOUS
  Filled 2015-02-17: qty 10

## 2015-02-17 MED ORDER — SODIUM CHLORIDE 0.9 % IV SOLN
1000.0000 mL | Freq: Once | INTRAVENOUS | Status: AC
  Administered 2015-02-17: 1000 mL via INTRAVENOUS

## 2015-02-17 MED ORDER — HEPARIN SOD (PORK) LOCK FLUSH 100 UNIT/ML IV SOLN
500.0000 [IU] | Freq: Once | INTRAVENOUS | Status: AC
  Administered 2015-02-17: 500 [IU] via INTRAVENOUS
  Filled 2015-02-17: qty 5

## 2015-02-17 MED ORDER — SODIUM CHLORIDE 0.9 % IJ SOLN
10.0000 mL | INTRAMUSCULAR | Status: DC | PRN
  Administered 2015-02-17: 10 mL via INTRAVENOUS
  Filled 2015-02-17: qty 10

## 2015-02-17 NOTE — Patient Instructions (Signed)
Dehydration, Adult Dehydration is when you lose more fluids from the body than you take in. Vital organs like the kidneys, brain, and heart cannot function without a proper amount of fluids and salt. Any loss of fluids from the body can cause dehydration.  CAUSES   Vomiting.  Diarrhea.  Excessive sweating.  Excessive urine output.  Fever. SYMPTOMS  Mild dehydration  Thirst.  Dry lips.  Slightly dry mouth. Moderate dehydration  Very dry mouth.  Sunken eyes.  Skin does not bounce back quickly when lightly pinched and released.  Dark urine and decreased urine production.  Decreased tear production.  Headache. Severe dehydration  Very dry mouth.  Extreme thirst.  Rapid, weak pulse (more than 100 beats per minute at rest).  Cold hands and feet.  Not able to sweat in spite of heat and temperature.  Rapid breathing.  Blue lips.  Confusion and lethargy.  Difficulty being awakened.  Minimal urine production.  No tears. DIAGNOSIS  Your caregiver will diagnose dehydration based on your symptoms and your exam. Blood and urine tests will help confirm the diagnosis. The diagnostic evaluation should also identify the cause of dehydration. TREATMENT  Treatment of mild or moderate dehydration can often be done at home by increasing the amount of fluids that you drink. It is best to drink small amounts of fluid more often. Drinking too much at one time can make vomiting worse. Refer to the home care instructions below. Severe dehydration needs to be treated at the hospital where you will probably be given intravenous (IV) fluids that contain water and electrolytes. HOME CARE INSTRUCTIONS   Ask your caregiver about specific rehydration instructions.  Drink enough fluids to keep your urine clear or pale yellow.  Drink small amounts frequently if you have nausea and vomiting.  Eat as you normally do.  Avoid:  Foods or drinks high in sugar.  Carbonated  drinks.  Juice.  Extremely hot or cold fluids.  Drinks with caffeine.  Fatty, greasy foods.  Alcohol.  Tobacco.  Overeating.  Gelatin desserts.  Wash your hands well to avoid spreading bacteria and viruses.  Only take over-the-counter or prescription medicines for pain, discomfort, or fever as directed by your caregiver.  Ask your caregiver if you should continue all prescribed and over-the-counter medicines.  Keep all follow-up appointments with your caregiver. SEEK MEDICAL CARE IF:  You have abdominal pain and it increases or stays in one area (localizes).  You have a rash, stiff neck, or severe headache.  You are irritable, sleepy, or difficult to awaken.  You are weak, dizzy, or extremely thirsty. SEEK IMMEDIATE MEDICAL CARE IF:   You are unable to keep fluids down or you get worse despite treatment.  You have frequent episodes of vomiting or diarrhea.  You have blood or green matter (bile) in your vomit.  You have blood in your stool or your stool looks black and tarry.  You have not urinated in 6 to 8 hours, or you have only urinated a small amount of very dark urine.  You have a fever.  You faint. MAKE SURE YOU:   Understand these instructions.  Will watch your condition.  Will get help right away if you are not doing well or get worse. Document Released: 08/13/2005 Document Revised: 11/05/2011 Document Reviewed: 04/02/2011 Exit[employer] Patient Information 2015 [employer]. This information is not intended to replace advice given to you by your health care provider. Make sure you discuss any questions you have with your health care   provider.  

## 2015-02-17 NOTE — Patient Instructions (Signed)

## 2015-02-18 ENCOUNTER — Encounter: Payer: Self-pay | Admitting: Internal Medicine

## 2015-02-18 NOTE — Progress Notes (Signed)
Called Mr. Leandro per Hinton Dyer..No answer..left message to return my call.

## 2015-02-20 NOTE — Patient Instructions (Signed)
Continue weekly labs as scheduled Follow-up in 2 weeks for reevaluation or sooner as needed

## 2015-02-20 NOTE — Progress Notes (Addendum)
[place] [phone]   [phone]  OFFICE PROGRESS NOTE  Mathews Argyle, MD [address] [place] [address]  DIAGNOSIS: Stage IV (T1b, N2, M1b) non-small cell lung cancer, adenocarcinoma, presented with a right lower lobe lung mass as well as mediastinal, hilar, bilateral supraclavicular lymphadenopathy in addition to extensive liver metastases and retroperitoneal lymphadenopathy diagnosed in December of 2014.  MOLECULAR BIOMARKERS: [facility] biomarker testing revealed TP53 E204*, KEAP1 R668f*25+, NFKBIA amplification, NKX2-1 amplification, SMARCA4 E463*. The patient was negative for EGFR and ALK gene translocation   PRIOR THERAPY:  1) Systemic chemotherapy with carboplatin for AUC of 5 and Alimta 500 mg/M2 every 3 weeks, status post 3 cycles. First dose 09/02/2013.  2)  Systemic chemotherapy with cisplatin 60 mg/M2 on days 1 and gemcitabine 800 mg/M2 on days 1 and 8 every 3 weeks, first cycle on 11/12/2013. Status post 12 cycles. Discontinued secondary to hypersensitivity reaction to cisplatin during cycle #12. 3) Systemic chemotherapy with gemcitabine 1000 mg/M2 on days 1 and 8 every 3 weeks, first cycle on 07/29/2014. Status post 3 cycles discontinued today secondary to disease progression. 4)  Immunotherapy with Nivolumab 3 MG/KG every 2 weeks. First cycle 10/05/2014. Status post 4 cycles. Discontinued secondary to disease progression.  CURRENT THERAPY: Systemic chemotherapy with docetaxel 75 mg/m2 and Cyramza 10 mg/kg given every 3 weeks with Neulasta support. Status post 4 cycles.  CHEMOTHERAPY INTENT: palliative  CURRENT # OF CHEMOTHERAPY CYCLES: 4  CURRENT ANTIEMETICS: Zofran, dexamethasone and Compazine.  CURRENT SMOKING STATUS: former smoker  ORAL CHEMOTHERAPY AND CONSENT: None  CURRENT BISPHOSPHONATES USE: None  PAIN MANAGEMENT: 0/10  NARCOTICS INDUCED CONSTIPATION: None  LIVING WILL AND CODE  STATUS: Full Code  INTERVAL HISTORY: Amber [id number].[id number]. female returns to the clinic today for follow up visit accompanied by her husband. She is status post 4 cycles of chemotherapy with docetaxel and Cyramza. She is tolerating this treatment without any significant adverse effects except for joint pains for 6-7 days after the Neulasta injection. Specifically she had no problems with shortness of breath, skin rash or diarrhea. She continues to complain of fatigue. She reports that her gums have been bleeding when she brushes her teeth. She reports of abdominal pain as well as cough and worsening of her neuropathy symptoms. She is experience some loss of balance but is not eating very well particularly of the last several days. She is only eating peaches and some watermelon. The lower abdominal pain tends to be worse at night when she is lying down. The neuropathy has come to a point where she is unable to button reciprocal. Her husband is helping with these tasks. She notes decreased  right upper quadrant abdominal pain. He reports some nausea.  She denied having any fever or chills. She has no significant weight loss or night sweats. She denied having any significant chest pain, shortness of breath, cough or hemoptysis.   MEDICAL HISTORY: Past Medical History  Diagnosis Date  . HTN (hypertension)   . lung ca dx'd 07/2013    ALLERGIES:  is allergic to cisplatin; hctz; and plendil.  MEDICATIONS:  Current Outpatient Prescriptions  Medication Sig Dispense Refill  . amLODipine (NORVASC) 5 MG tablet     . aspirin EC 81 MG tablet Take 81 mg by mouth every morning.     . calcium carbonate (TUMS - DOSED IN MG ELEMENTAL CALCIUM) 500 MG chewable tablet Chew 1 tablet by mouth 3 (three)  times daily as needed for indigestion or heartburn.     . dexamethasone (DECADRON) 4 MG tablet Take 2 tablets by mouth twice daily with food the day before, the day of, and the day after chemotherapy 50 tablet 0  .  DULoxetine (CYMBALTA) 30 MG capsule Take 1 capsule (30 mg total) by mouth daily. 90 capsule 1  . lidocaine-prilocaine (EMLA) cream Apply topically once. 30 g 0  . lisinopril (PRINIVIL,ZESTRIL) 20 MG tablet Take 20 mg by mouth every morning.     . loratadine (CLARITIN) 10 MG tablet Take as directed 30 tablet 1  . nebivolol (BYSTOLIC) 10 MG tablet Take 10 mg by mouth every morning.     Marland Kitchen omeprazole (PRILOSEC) 40 MG capsule Take 1 capsule (40 mg total) by mouth daily. 90 capsule 1  . oxyCODONE (ROXICODONE) 5 MG immediate release tablet Take 1 tablet (5 mg total) by mouth every 6 (six) hours as needed for severe pain. 60 tablet 0  . prochlorperazine (COMPAZINE) 10 MG tablet Take 1 tablet (10 mg total) by mouth every 6 (six) hours as needed for nausea or vomiting. 90 tablet 1   No current facility-administered medications for this visit.   Facility-Administered Medications Ordered in Other Visits  Medication Dose Route Frequency Provider Last Rate Last Dose  . sodium chloride 0.9 % injection 10 mL  10 mL Intracatheter PRN Curt Bears, MD   10 mL at 11/16/14 1625    REVIEW OF SYSTEMS:  Constitutional: positive for anorexia, fatigue and weight loss Eyes: negative Ears, nose, mouth, throat, and face: positive for hearing loss Respiratory: negative Cardiovascular: negative Gastrointestinal: positive for abdominal pain, nausea and Right upper quadrant and lower abdominal pain Genitourinary:negative Integument/breast: positive for skin lesion(s) Hematologic/lymphatic: negative Musculoskeletal:positive for arthralgias, back pain and after the Neulasta injections Neurological: positive for paresthesia Behavioral/Psych: negative Endocrine: negative Allergic/Immunologic: negative   PHYSICAL EXAMINATION: General appearance: alert, cooperative and no distress Head: Normocephalic, without obvious abnormality, atraumatic Neck: no adenopathy, no JVD, supple, symmetrical, trachea midline and thyroid  not enlarged, symmetric, no tenderness/mass/nodules Lymph nodes: Cervical, supraclavicular, and axillary nodes normal. Resp: clear to auscultation bilaterally Back: symmetric, no curvature. ROM normal. No CVA tenderness. Cardio: regular rate and rhythm, S1, S2 normal, no murmur, click, rub or gallop GI: soft, non-tender; bowel sounds normal; no masses,  no organomegaly Extremities: extremities normal, atraumatic, no cyanosis or edema Neurologic: Alert and oriented X 3, normal strength and tone. Normal symmetric reflexes. Normal coordination and gait Skin: Approximately 1-1/2-2 cm ovoid, hyperpigmented, dry skin lesions in the region of the medial condyle of the femur bilaterally. These have improved and are less hyperpigmented and the skin is more moisturized. There is  no bleeding or serosanguineous oozing. Overall the skin on the body is generally dry and peeling.  ECOG PERFORMANCE STATUS: 1 - Symptomatic but completely ambulatory  Blood pressure 100/62, pulse 83, temperature 97.9 F (36.6 C), temperature source Oral, resp. rate 18, height 5' 2"  (1.575 m), weight 123 lb 1.6 oz (55.838 kg), SpO2 97 %.  LABORATORY DATA: Lab Results  Component Value Date   WBC 23.2* 02/17/2015   HGB 9.1* 02/17/2015   HCT 27.5* 02/17/2015   MCV 96.8 02/17/2015   PLT 53* 02/17/2015      Chemistry      Component Value Date/Time   NA 138 02/17/2015 1411   NA 135 05/13/2014 1007   K 3.9 02/17/2015 1411   K 4.5 05/13/2014 1007   CL 96 05/13/2014 1007   CO2  25 02/17/2015 1411   CO2 29 05/13/2014 1007   BUN 12.6 02/17/2015 1411   BUN 18 05/13/2014 1007   CREATININE 0.8 02/17/2015 1411   CREATININE 1.00 05/13/2014 1007      Component Value Date/Time   CALCIUM 8.1* 02/17/2015 1411   CALCIUM 9.5 05/13/2014 1007       RADIOGRAPHIC STUDIES: Ct Chest W Contrast  02/04/2015   CLINICAL DATA:  Subsequent encounter for non-small-cell lung cancer. Chest pain with cough.  EXAM: CT CHEST, ABDOMEN, AND  PELVIS WITH CONTRAST  TECHNIQUE: Multidetector CT imaging of the chest, abdomen and pelvis was performed following the standard protocol during bolus administration of intravenous contrast.  CONTRAST:  32m OMNIPAQUE IOHEXOL 300 MG/ML  SOLN  COMPARISON:  11/29/2014  FINDINGS: CT CHEST FINDINGS  Mediastinum/Nodes: The tip of the right-sided Port-A-Cath is positioned in the mid right atrium. There is no axillary lymphadenopathy. No mediastinal or hilar lymphadenopathy. Heart size is normal. No pericardial effusion.  Lungs/Pleura: Stable volume loss in the right hemi thorax. Underlying changes of centrilobular emphysema again noted. Central right lower lobe spiculated lesion measures 2.0 x 2.5 cm today compared to 2.7 x 2.7 cm previously. Small satellite nodule seen previously have resolved in the interval. Subsegmental atelectasis in the right lower lobe is again noted. Small right pleural effusion has progressed in the interval.  Musculoskeletal: Bone windows reveal no worrisome lytic or sclerotic osseous lesions.  CT ABDOMEN AND PELVIS FINDINGS  Hepatobiliary: Numerous liver metastases are again noted. Index lesion in the medial segment left liver measured previously at 2.9 x 2.9 cm now measures 2.2 x 2.0 cm. A second lesion in the inferior left liver measures 4.9 x 3.5 cm today compared to 5.8 x 4.7 cm when I remeasure it on the previous exam. No evidence gallstones. No intrahepatic or extrahepatic biliary dilation.  Pancreas: No focal mass lesion. No dilatation of the main duct. No intraparenchymal cyst. No peripancreatic edema.  Spleen: No splenomegaly. No focal mass lesion.  Adrenals/Urinary Tract: No adrenal nodule or mass. No enhancing lesion seen in either kidney. No hydronephrosis. No evidence for hydroureter. Urinary bladder is unremarkable.  Stomach/Bowel: Small hiatal hernia noted. Stomach is nondistended. Small duodenum diverticulum is evident. No evidence for small bowel obstruction. Terminal ileum is  normal. The appendix is not visualized, but there is no edema or inflammation in the region of the cecum. No gross colonic mass. No colonic wall thickening. No substantial diverticular change.  Vascular/Lymphatic: There is abdominal aortic atherosclerosis without aneurysm. Scattered lymph nodes are seen in the upper abdomen without overt lymphadenopathy in the abdomen. No pelvic sidewall lymphadenopathy.  Reproductive: Large exophytic uterine fibroid which was seen posteriorly in the right adnexal space on the previous study is now in the anterior right adnexal space. Other uterine fibroids are evident. No evidence for adnexal mass.  Other: There is mild to moderate ascites in the abdomen and pelvis, clearly progressed in the interval.  Musculoskeletal: Bone windows reveal no worrisome lytic or sclerotic osseous lesions.  IMPRESSION: 1. Interval decrease in size of the central right lower lobe pulmonary nodule. Multiple satellite nodule seen in this region previously has resolved in the interval. 2. Interval decrease in size of multiple bulky hepatic metastases. 3. No evidence for abdominal or pelvic lymphadenopathy. 4. Interval progression of ascites and right pleural effusion.   Electronically Signed   By: EMisty StanleyM.D.   On: 02/04/2015 12:54   Ct Abdomen Pelvis W Contrast  02/04/2015   CLINICAL DATA:  Subsequent encounter for non-small-cell lung cancer. Chest pain with cough.  EXAM: CT CHEST, ABDOMEN, AND PELVIS WITH CONTRAST  TECHNIQUE: Multidetector CT imaging of the chest, abdomen and pelvis was performed following the standard protocol during bolus administration of intravenous contrast.  CONTRAST:  63m OMNIPAQUE IOHEXOL 300 MG/ML  SOLN  COMPARISON:  11/29/2014  FINDINGS: CT CHEST FINDINGS  Mediastinum/Nodes: The tip of the right-sided Port-A-Cath is positioned in the mid right atrium. There is no axillary lymphadenopathy. No mediastinal or hilar lymphadenopathy. Heart size is normal. No pericardial  effusion.  Lungs/Pleura: Stable volume loss in the right hemi thorax. Underlying changes of centrilobular emphysema again noted. Central right lower lobe spiculated lesion measures 2.0 x 2.5 cm today compared to 2.7 x 2.7 cm previously. Small satellite nodule seen previously have resolved in the interval. Subsegmental atelectasis in the right lower lobe is again noted. Small right pleural effusion has progressed in the interval.  Musculoskeletal: Bone windows reveal no worrisome lytic or sclerotic osseous lesions.  CT ABDOMEN AND PELVIS FINDINGS  Hepatobiliary: Numerous liver metastases are again noted. Index lesion in the medial segment left liver measured previously at 2.9 x 2.9 cm now measures 2.2 x 2.0 cm. A second lesion in the inferior left liver measures 4.9 x 3.5 cm today compared to 5.8 x 4.7 cm when I remeasure it on the previous exam. No evidence gallstones. No intrahepatic or extrahepatic biliary dilation.  Pancreas: No focal mass lesion. No dilatation of the main duct. No intraparenchymal cyst. No peripancreatic edema.  Spleen: No splenomegaly. No focal mass lesion.  Adrenals/Urinary Tract: No adrenal nodule or mass. No enhancing lesion seen in either kidney. No hydronephrosis. No evidence for hydroureter. Urinary bladder is unremarkable.  Stomach/Bowel: Small hiatal hernia noted. Stomach is nondistended. Small duodenum diverticulum is evident. No evidence for small bowel obstruction. Terminal ileum is normal. The appendix is not visualized, but there is no edema or inflammation in the region of the cecum. No gross colonic mass. No colonic wall thickening. No substantial diverticular change.  Vascular/Lymphatic: There is abdominal aortic atherosclerosis without aneurysm. Scattered lymph nodes are seen in the upper abdomen without overt lymphadenopathy in the abdomen. No pelvic sidewall lymphadenopathy.  Reproductive: Large exophytic uterine fibroid which was seen posteriorly in the right adnexal space  on the previous study is now in the anterior right adnexal space. Other uterine fibroids are evident. No evidence for adnexal mass.  Other: There is mild to moderate ascites in the abdomen and pelvis, clearly progressed in the interval.  Musculoskeletal: Bone windows reveal no worrisome lytic or sclerotic osseous lesions.  IMPRESSION: 1. Interval decrease in size of the central right lower lobe pulmonary nodule. Multiple satellite nodule seen in this region previously has resolved in the interval. 2. Interval decrease in size of multiple bulky hepatic metastases. 3. No evidence for abdominal or pelvic lymphadenopathy. 4. Interval progression of ascites and right pleural effusion.   Electronically Signed   By: EMisty StanleyM.D.   On: 02/04/2015 12:54   Dg Abd 2 Views  02/17/2015   CLINICAL DATA:  Lower abdominal pain.  EXAM: ABDOMEN - 2 VIEW  COMPARISON:  CT scan of February 04, 2015.  FINDINGS: The bowel gas pattern is normal. There is no evidence of free air. Phleboliths are noted in the pelvis.  IMPRESSION: No evidence of bowel obstruction or ileus.   Electronically Signed   By: JMarijo Conception M.D.   On: 02/17/2015 15:59   ASSESSMENT AND PLAN: This is  a very pleasant 64 years old white female recently diagnosed with metastatic non-small cell lung cancer, adenocarcinoma presented with right lower lobe lung mass in addition to mediastinal and supraclavicular lymphadenopathy as well as diffuse metastatic liver lesions and retroperitoneal lymphadenopathy. She underwent 3 cycles of systemic chemotherapy with carboplatin and Alimta but unfortunately has evidence for disease progression especially in the liver and new small left adrenal metastasis. She underwent second line chemotherapy with cisplatin and gemcitabine status post 12 cycles and tolerated it fairly well patient after reducing the dose of cisplatin. This treatment was discontinued after the patient developed hypersensitivity reaction to cisplatin. The  patient also completed 3 cycles of chemotherapy with single agent gemcitabine but unfortunately the recent CT scan of the chest, abdomen and pelvis showed evidence for disease progression especially in the liver. The patient was treated with immunotherapy with single agent Nivolumab 3 MG/KG every 2 weeks is status post 4 cycles, but unfortunately her restaging CT scan revealed evidence for disease progression. She is currently being treated with  Docetaxel 75 mg/m2 and Cyramza 10 mg/kg given every 3 weeks with Neualsta support. She is tolerating her treatment relatively well except for joint and back pain for several days after the Neulasta injections. She status post 4 cycles. Her recent restaging CT scan revealed interval on decrease in the size of her central right lower lobe pulmonary nodule as well as decrease in the size of her multiple bulky hepatic metastasis. There was some increase in some ascites and right pleural effusion and we will continue to monitor this on subsequent restaging CT scans. The patient was discussed with and also seen by Dr. Julien Nordmann. Her platelet count is 53,000 today and may be responsible for her bleeding gums when she is brushing her teeth. The remainder of her symptoms may be a result of her response to her level latest cycle of hemotherapy with docetaxel and see [place] with Neulasta support. She is not eating or drinking well and this may be impacting her balance. She'll be given a liter of normal saline today with 8 mg of Zofran to address her nausea and dehydration. She will continue with weekly labs as previously scheduled and follow-up also as previously scheduled.   She was advised to call immediately if she has any concerning symptoms in the interval. The patient voices understanding of current disease status and treatment options and is in agreement with the current care plan.  All questions were answered. The patient knows to call the clinic with any problems,  questions or concerns. We can certainly see the patient much sooner if necessary.  Carlton Adam , PA-C 02/20/2015   ADDENDUM: Hematology/Oncology Attending: I had a face to face encounter with the patient. I recommended her care plan. This is a very pleasant 64 years old white female with metastatic non-small cell lung cancer, adenocarcinoma status post several chemotherapy regimens and she is currently on treatment with docetaxel and Cyramza status post 4 cycles. The patient is tolerating her treatment well except for the alopecia and fatigue. She denied having any significant nausea or vomiting, no fever or chills. She has some bleeding from her gum secondary to low platelets today in addition to abdominal pain and lack of appetite. Her last imaging studies after cycle #3 showed further improvement in her disease. I recommended for the patient to continue her treatment as a scheduled in 2 weeks. For the lack of appetite and dehydration, will arrange for the patient to receive IV hydration today.  We will also order abdominal x-ray to rule out any ileus, obstruction or perforation. She would come back for follow-up visit in 2 weeks for reevaluation before starting cycle #5. The patient was advised to call immediately if she has any concerning symptoms in the interval.  Disclaimer: This note was dictated with voice recognition software. Similar sounding words can inadvertently be transcribed and may be missed upon review. Eilleen Kempf., MD 02/22/2015

## 2015-02-22 ENCOUNTER — Other Ambulatory Visit: Payer: 59

## 2015-02-22 ENCOUNTER — Other Ambulatory Visit: Payer: Self-pay | Admitting: Medical Oncology

## 2015-02-22 ENCOUNTER — Ambulatory Visit: Payer: 59 | Admitting: Oncology

## 2015-02-22 ENCOUNTER — Encounter: Payer: Self-pay | Admitting: Physician Assistant

## 2015-02-22 DIAGNOSIS — C3491 Malignant neoplasm of unspecified part of right bronchus or lung: Secondary | ICD-10-CM

## 2015-02-23 ENCOUNTER — Other Ambulatory Visit (HOSPITAL_BASED_OUTPATIENT_CLINIC_OR_DEPARTMENT_OTHER): Payer: 59

## 2015-02-23 ENCOUNTER — Ambulatory Visit (HOSPITAL_BASED_OUTPATIENT_CLINIC_OR_DEPARTMENT_OTHER): Payer: 59

## 2015-02-23 VITALS — BP 101/58 | HR 68 | Temp 97.8°F | Resp 18

## 2015-02-23 DIAGNOSIS — Z452 Encounter for adjustment and management of vascular access device: Secondary | ICD-10-CM

## 2015-02-23 DIAGNOSIS — C3431 Malignant neoplasm of lower lobe, right bronchus or lung: Secondary | ICD-10-CM

## 2015-02-23 DIAGNOSIS — Z95828 Presence of other vascular implants and grafts: Secondary | ICD-10-CM

## 2015-02-23 DIAGNOSIS — C3491 Malignant neoplasm of unspecified part of right bronchus or lung: Secondary | ICD-10-CM

## 2015-02-23 LAB — COMPREHENSIVE METABOLIC PANEL (CC13)
ALT: 18 U/L (ref 0–55)
AST: 36 U/L — AB (ref 5–34)
Albumin: 2.4 g/dL — ABNORMAL LOW (ref 3.5–5.0)
Alkaline Phosphatase: 198 U/L — ABNORMAL HIGH (ref 40–150)
Anion Gap: 8 mEq/L (ref 3–11)
BUN: 13.6 mg/dL (ref 7.0–26.0)
CO2: 22 mEq/L (ref 22–29)
CREATININE: 0.8 mg/dL (ref 0.6–1.1)
Calcium: 8.2 mg/dL — ABNORMAL LOW (ref 8.4–10.4)
Chloride: 109 mEq/L (ref 98–109)
EGFR: 82 mL/min/{1.73_m2} — ABNORMAL LOW (ref >90)
Glucose: 102 mg/dl (ref 70–140)
Potassium: 4 mEq/L (ref 3.5–5.1)
Sodium: 140 mEq/L (ref 136–145)
Total Bilirubin: 0.65 mg/dL (ref 0.20–1.20)
Total Protein: 5.1 g/dL — ABNORMAL LOW (ref 6.4–8.3)

## 2015-02-23 LAB — CBC WITH DIFFERENTIAL/PLATELET
BASO%: 1.1 % (ref 0.0–2.0)
Basophils Absolute: 0.2 10*3/uL — ABNORMAL HIGH (ref 0.0–0.1)
EOS%: 0 % (ref 0.0–7.0)
Eosinophils Absolute: 0 10*3/uL (ref 0.0–0.5)
HCT: 30.3 % — ABNORMAL LOW (ref 34.8–46.6)
HEMOGLOBIN: 9.8 g/dL — AB (ref 11.6–15.9)
LYMPH#: 1.6 10*3/uL (ref 0.9–3.3)
LYMPH%: 11.7 % — ABNORMAL LOW (ref 14.0–49.7)
MCH: 31.8 pg (ref 25.1–34.0)
MCHC: 32.2 g/dL (ref 31.5–36.0)
MCV: 98.5 fL (ref 79.5–101.0)
MONO#: 0.7 10*3/uL (ref 0.1–0.9)
MONO%: 5.3 % (ref 0.0–14.0)
NEUT#: 11.5 10*3/uL — ABNORMAL HIGH (ref 1.5–6.5)
NEUT%: 81.9 % — ABNORMAL HIGH (ref 38.4–76.8)
PLATELETS: 101 10*3/uL — AB (ref 145–400)
RBC: 3.08 10*6/uL — ABNORMAL LOW (ref 3.70–5.45)
RDW: 19 % — AB (ref 11.2–14.5)
WBC: 14 10*3/uL — ABNORMAL HIGH (ref 3.9–10.3)

## 2015-02-23 MED ORDER — SODIUM CHLORIDE 0.9 % IJ SOLN
10.0000 mL | INTRAMUSCULAR | Status: DC | PRN
  Administered 2015-02-23: 10 mL via INTRAVENOUS
  Filled 2015-02-23: qty 10

## 2015-02-23 MED ORDER — HEPARIN SOD (PORK) LOCK FLUSH 100 UNIT/ML IV SOLN
500.0000 [IU] | Freq: Once | INTRAVENOUS | Status: AC
  Administered 2015-02-23: 500 [IU] via INTRAVENOUS
  Filled 2015-02-23: qty 5

## 2015-02-23 NOTE — Patient Instructions (Signed)

## 2015-03-01 ENCOUNTER — Ambulatory Visit: Payer: 59

## 2015-03-01 ENCOUNTER — Ambulatory Visit (HOSPITAL_BASED_OUTPATIENT_CLINIC_OR_DEPARTMENT_OTHER): Payer: 59 | Admitting: Nurse Practitioner

## 2015-03-01 ENCOUNTER — Ambulatory Visit (HOSPITAL_BASED_OUTPATIENT_CLINIC_OR_DEPARTMENT_OTHER): Payer: 59

## 2015-03-01 ENCOUNTER — Other Ambulatory Visit: Payer: 59

## 2015-03-01 ENCOUNTER — Encounter (HOSPITAL_COMMUNITY): Payer: Self-pay

## 2015-03-01 ENCOUNTER — Other Ambulatory Visit (HOSPITAL_BASED_OUTPATIENT_CLINIC_OR_DEPARTMENT_OTHER): Payer: 59

## 2015-03-01 ENCOUNTER — Ambulatory Visit (HOSPITAL_COMMUNITY)
Admission: RE | Admit: 2015-03-01 | Discharge: 2015-03-01 | Disposition: A | Discharge status: Home / Self Care | Payer: 59 | Source: Ambulatory Visit | Attending: Nurse Practitioner | Admitting: Nurse Practitioner

## 2015-03-01 VITALS — BP 98/63 | HR 82 | Temp 98.0°F | Resp 18 | Ht 62.0 in | Wt 125.8 lb

## 2015-03-01 VITALS — BP 121/76 | HR 74 | Temp 97.6°F | Resp 17

## 2015-03-01 DIAGNOSIS — R109 Unspecified abdominal pain: Secondary | ICD-10-CM

## 2015-03-01 DIAGNOSIS — C3491 Malignant neoplasm of unspecified part of right bronchus or lung: Secondary | ICD-10-CM

## 2015-03-01 DIAGNOSIS — R1030 Lower abdominal pain, unspecified: Secondary | ICD-10-CM

## 2015-03-01 DIAGNOSIS — C3431 Malignant neoplasm of lower lobe, right bronchus or lung: Secondary | ICD-10-CM | POA: Diagnosis not present

## 2015-03-01 DIAGNOSIS — C3490 Malignant neoplasm of unspecified part of unspecified bronchus or lung: Secondary | ICD-10-CM | POA: Insufficient documentation

## 2015-03-01 DIAGNOSIS — E860 Dehydration: Secondary | ICD-10-CM | POA: Diagnosis not present

## 2015-03-01 DIAGNOSIS — J90 Pleural effusion, not elsewhere classified: Secondary | ICD-10-CM | POA: Diagnosis not present

## 2015-03-01 DIAGNOSIS — C7931 Secondary malignant neoplasm of brain: Secondary | ICD-10-CM | POA: Insufficient documentation

## 2015-03-01 DIAGNOSIS — Z95828 Presence of other vascular implants and grafts: Secondary | ICD-10-CM

## 2015-03-01 LAB — CBC WITH DIFFERENTIAL/PLATELET
BASO%: 0.6 % (ref 0.0–2.0)
BASOS ABS: 0.1 10*3/uL (ref 0.0–0.1)
EOS ABS: 0 10*3/uL (ref 0.0–0.5)
EOS%: 0 % (ref 0.0–7.0)
HEMATOCRIT: 32.6 % — AB (ref 34.8–46.6)
HEMOGLOBIN: 10.5 g/dL — AB (ref 11.6–15.9)
LYMPH%: 8 % — ABNORMAL LOW (ref 14.0–49.7)
MCH: 32.1 pg (ref 25.1–34.0)
MCHC: 32.4 g/dL (ref 31.5–36.0)
MCV: 99.1 fL (ref 79.5–101.0)
MONO#: 0.2 10*3/uL (ref 0.1–0.9)
MONO%: 1.3 % (ref 0.0–14.0)
NEUT%: 90.1 % — ABNORMAL HIGH (ref 38.4–76.8)
NEUTROS ABS: 13.8 10*3/uL — AB (ref 1.5–6.5)
Platelets: 153 10*3/uL (ref 145–400)
RBC: 3.29 10*6/uL — ABNORMAL LOW (ref 3.70–5.45)
RDW: 19.6 % — AB (ref 11.2–14.5)
WBC: 15.3 10*3/uL — ABNORMAL HIGH (ref 3.9–10.3)
lymph#: 1.2 10*3/uL (ref 0.9–3.3)

## 2015-03-01 LAB — COMPREHENSIVE METABOLIC PANEL (CC13)
ALBUMIN: 2.4 g/dL — AB (ref 3.5–5.0)
ALT: 22 U/L (ref 0–55)
AST: 41 U/L — AB (ref 5–34)
Alkaline Phosphatase: 182 U/L — ABNORMAL HIGH (ref 40–150)
Anion Gap: 9 mEq/L (ref 3–11)
BUN: 16.5 mg/dL (ref 7.0–26.0)
CO2: 23 mEq/L (ref 22–29)
Calcium: 8.9 mg/dL (ref 8.4–10.4)
Chloride: 109 mEq/L (ref 98–109)
Creatinine: 0.8 mg/dL (ref 0.6–1.1)
EGFR: 77 mL/min/{1.73_m2} — ABNORMAL LOW (ref >90)
Glucose: 164 mg/dl — ABNORMAL HIGH (ref 70–140)
POTASSIUM: 4.1 meq/L (ref 3.5–5.1)
SODIUM: 140 meq/L (ref 136–145)
TOTAL PROTEIN: 5.5 g/dL — AB (ref 6.4–8.3)
Total Bilirubin: 0.64 mg/dL (ref 0.20–1.20)

## 2015-03-01 LAB — UA PROTEIN, DIPSTICK - CHCC: Protein, ur: <30 mg/dL

## 2015-03-01 MED ORDER — IOHEXOL 300 MG/ML  SOLN
50.0000 mL | Freq: Once | INTRAMUSCULAR | Status: AC | PRN
  Administered 2015-03-01: 50 mL via ORAL

## 2015-03-01 MED ORDER — SODIUM CHLORIDE 0.9 % IV SOLN
Freq: Once | INTRAVENOUS | Status: AC
  Administered 2015-03-01: 16:00:00 via INTRAVENOUS

## 2015-03-01 MED ORDER — HEPARIN SOD (PORK) LOCK FLUSH 100 UNIT/ML IV SOLN
500.0000 [IU] | INTRAVENOUS | Status: AC | PRN
  Administered 2015-03-01: 500 [IU]
  Filled 2015-03-01: qty 5

## 2015-03-01 MED ORDER — SODIUM CHLORIDE 0.9 % IJ SOLN
10.0000 mL | INTRAMUSCULAR | Status: AC | PRN
  Administered 2015-03-01: 10 mL
  Filled 2015-03-01: qty 10

## 2015-03-01 MED ORDER — IOHEXOL 300 MG/ML  SOLN
100.0000 mL | Freq: Once | INTRAMUSCULAR | Status: AC | PRN
  Administered 2015-03-01: 100 mL via INTRAVENOUS

## 2015-03-01 MED ORDER — SODIUM CHLORIDE 0.9 % IJ SOLN
10.0000 mL | INTRAMUSCULAR | Status: DC | PRN
  Administered 2015-03-01: 10 mL via INTRAVENOUS
  Filled 2015-03-01: qty 10

## 2015-03-01 NOTE — Patient Instructions (Signed)
Dehydration, Adult Dehydration is when you lose more fluids from the body than you take in. Vital organs like the kidneys, brain, and heart cannot function without a proper amount of fluids and salt. Any loss of fluids from the body can cause dehydration.  CAUSES   Vomiting.  Diarrhea.  Excessive sweating.  Excessive urine output.  Fever. SYMPTOMS  Mild dehydration  Thirst.  Dry lips.  Slightly dry mouth. Moderate dehydration  Very dry mouth.  Sunken eyes.  Skin does not bounce back quickly when lightly pinched and released.  Dark urine and decreased urine production.  Decreased tear production.  Headache. Severe dehydration  Very dry mouth.  Extreme thirst.  Rapid, weak pulse (more than 100 beats per minute at rest).  Cold hands and feet.  Not able to sweat in spite of heat and temperature.  Rapid breathing.  Blue lips.  Confusion and lethargy.  Difficulty being awakened.  Minimal urine production.  No tears. DIAGNOSIS  Your caregiver will diagnose dehydration based on your symptoms and your exam. Blood and urine tests will help confirm the diagnosis. The diagnostic evaluation should also identify the cause of dehydration. TREATMENT  Treatment of mild or moderate dehydration can often be done at home by increasing the amount of fluids that you drink. It is best to drink small amounts of fluid more often. Drinking too much at one time can make vomiting worse. Refer to the home care instructions below. Severe dehydration needs to be treated at the hospital where you will probably be given intravenous (IV) fluids that contain water and electrolytes. HOME CARE INSTRUCTIONS   Ask your caregiver about specific rehydration instructions.  Drink enough fluids to keep your urine clear or pale yellow.  Drink small amounts frequently if you have nausea and vomiting.  Eat as you normally do.  Avoid:  Foods or drinks high in sugar.  Carbonated  drinks.  Juice.  Extremely hot or cold fluids.  Drinks with caffeine.  Fatty, greasy foods.  Alcohol.  Tobacco.  Overeating.  Gelatin desserts.  Wash your hands well to avoid spreading bacteria and viruses.  Only take over-the-counter or prescription medicines for pain, discomfort, or fever as directed by your caregiver.  Ask your caregiver if you should continue all prescribed and over-the-counter medicines.  Keep all follow-up appointments with your caregiver. SEEK MEDICAL CARE IF:  You have abdominal pain and it increases or stays in one area (localizes).  You have a rash, stiff neck, or severe headache.  You are irritable, sleepy, or difficult to awaken.  You are weak, dizzy, or extremely thirsty. SEEK IMMEDIATE MEDICAL CARE IF:   You are unable to keep fluids down or you get worse despite treatment.  You have frequent episodes of vomiting or diarrhea.  You have blood or green matter (bile) in your vomit.  You have blood in your stool or your stool looks black and tarry.  You have not urinated in 6 to 8 hours, or you have only urinated a small amount of very dark urine.  You have a fever.  You faint. MAKE SURE YOU:   Understand these instructions.  Will watch your condition.  Will get help right away if you are not doing well or get worse. Document Released: 08/13/2005 Document Revised: 11/05/2011 Document Reviewed: 04/02/2011 Exit[employer] Patient Information 2015 [employer]. This information is not intended to replace advice given to you by your health care provider. Make sure you discuss any questions you have with your health care   provider.  

## 2015-03-01 NOTE — Progress Notes (Addendum)
[place] OFFICE PROGRESS NOTE   DIAGNOSIS: Stage IV (T1b, N2, M1b) non-small cell lung cancer, adenocarcinoma, presented with a right lower lobe lung mass as well as mediastinal, hilar, bilateral supraclavicular lymphadenopathy in addition to extensive liver metastases and retroperitoneal lymphadenopathy diagnosed in December of 2014.  MOLECULAR BIOMARKERS: [facility] ONE biomarker testing revealed TP53 E204*, KEAP1 R668f*25+, NFKBIA amplification, NKX2-1 amplification, SMARCA4 E463*. The patient was negative for EGFR and ALK gene translocation   PRIOR THERAPY:  1) Systemic chemotherapy with carboplatin for AUC of 5 and Alimta 500 mg/M2 every 3 weeks, status post 3 cycles. First dose 09/02/2013.  2) Systemic chemotherapy with cisplatin 60 mg/M2 on days 1 and gemcitabine 800 mg/M2 on days 1 and 8 every 3 weeks, first cycle on 11/12/2013. Status post 12 cycles. Discontinued secondary to hypersensitivity reaction to cisplatin during cycle #12. 3) Systemic chemotherapy with gemcitabine 1000 mg/M2 on days 1 and 8 every 3 weeks, first cycle on 07/29/2014. Status post 3 cycles discontinued today secondary to disease progression. 4) Immunotherapy with Nivolumab 3 MG/KG every 2 weeks. First cycle 10/05/2014. Status post 4 cycles. Discontinued secondary to disease progression.  CURRENT THERAPY: Systemic chemotherapy with docetaxel 75 mg/m2 and Cyramza 10 mg/kg given every 3 weeks with Neulasta support. Status post 4 cycles.  INTERVAL HISTORY:   Ms. KMondesirreturns as scheduled. She was seen in an unscheduled visit on 02/17/2015. She received IV fluids. She was experiencing abdominal pain. Abdominal x-ray was unremarkable.  Her husband notes that her abdomen appears distended. She continues to have intermittent abdominal pain. The pain worsens when she coughs. She has stable dyspnea on exertion. No nausea or vomiting. She had diarrhea on 02/20/2015 and 02/21/2015. The diarrhea resolved  following a single dose of Imodium. Energy level is poor. Her balance is worse. Neuropathy symptoms are worse. Her husband notes she is sleeping more. Appetite continues to be poor.  Objective:  Vital signs in last 24 hours:  Blood pressure 98/63, pulse 82, temperature 98 F (36.7 C), temperature source Oral, resp. rate 18, height 5' 2"  (1.575 m), weight 125 lb 12.8 oz (57.063 kg), SpO2 99 %.    HEENT: No thrush or ulcers. Resp: Diminished breath sounds right lower lung field.  Cardio: Regular rate and rhythm. GI: Abdomen is distended. Bowel sounds active. No hepatomegaly. Vascular: Trace lower leg edema bilaterally. Neuro: Hard of hearing. Alert. Oriented. Speech is fluent.   Port-A-Cath without erythema.  Lab Results:  Lab Results  Component Value Date   WBC 15.3* 03/01/2015   HGB 10.5* 03/01/2015   HCT 32.6* 03/01/2015   MCV 99.1 03/01/2015   PLT 153 03/01/2015   NEUTROABS 13.8* 03/01/2015    Imaging:  No results found.  Medications: I have reviewed the patient's current medications.  Assessment/Plan: 1. Stage IV non-small cell lung cancer, adenocarcinoma status post several chemotherapy regimens including carboplatin and Alimta, cisplatin and gemcitabine as well as single agent gemcitabine and most recently treatment with immunotherapy with Nivolumab status post 4 cycles with disease progression. Treatment initiated with docetaxel and cyramza 12/07/2014. She completed cycle 4 on 02/08/2015.    Disposition: Ms. KTakayamahas completed 4 cycles of docetaxel and cyramza. She is general is not feeling well. She has a poor appetite and poor energy level. She continues to have abdominal pain and has distention on exam today. Her balance has worsened. We are holding cycle 5 today and referring her for CT scans of the abdomen/pelvis and brain. Following the scan she will return  to the office for IV fluids.  Addendum 4:15 PM-brain CT shows two 6 mm foci of enhancement, one in the  anterior left frontal lobe and the other in the high right parietal lobe. No significant edema associated with the lesions. CT abdomen/pelvis showed enlarging pleural effusions and near complete right lower lobe atelectasis; stable diffuse hepatic metastatic disease; large volume ascites; diffuse colitis suspected.  Scans were reviewed by Dr. Julien Nordmann with recommendation for a paracentesis with fluid to be sent for cytology. We will try to arrange the paracentesis to be done on 03/02/2015.  She will return for a follow-up visit on 03/07/2015.  Ned Card ANP/GNP-BC   03/01/2015  11:48 AM  ADDENDUM: Hematology/Oncology Attending: I had a face to face encounter with the patient. I recommended her care plan. This is a very pleasant 64 years old white female with metastatic non-small cell lung cancer, adenocarcinoma status post several chemotherapy regimens and currently undergoing treatment with docetaxel and Cyramza status post 4 cycles. Her last CT scan of the chest, abdomen and pelvis showed no evidence for disease progression but the patient presented today with increasing fatigue and weakness as well as nausea and abdominal distention. We ordered a stat CT scan of the abdomen pelvis which showed enlarging pleural effusion as well as near-complete right lower lobe atelectasis in addition to stable diffuse hepatic metastatic disease and large volume of ascites with diffuse colitis. I discussed the scan results with the patient and her husband. Unfortunately this is a sign of very poor prognosis at this point. I will arrange for the patient to have ultrasound-guided paracentesis for drainage of the sinusitis. We may also consider her for ultrasound-guided right thoracentesis for drainage of the right pleural effusion. She would come back for follow-up visit as previously scheduled or sooner if needed. The patient was advised to call immediately if she has any concerning symptoms in the  interval.  Disclaimer: This note was dictated with voice recognition software. Similar sounding words can inadvertently be transcribed and may not be corrected upon review. Eilleen Kempf., MD 03/05/2015

## 2015-03-01 NOTE — Progress Notes (Signed)
16:40 - Spoke with Ned Card, NP about plan of care for pt.  She gave verbal order to run NS at 368m/hr until treatment area closes.  She does not wish to run it any faster d/t fluid accumulation in abdomen.  Pt notified of plan for fluids this afternoon as well as paracentesis planned for 03/02/2015.  Informed patient she would receive a phone call with time of procedure.  No further questions from pt of pt family member at this time.  Plan of care discussed with Dawn, RN who is taking over care for pt and no further questions or concerns at this time.

## 2015-03-01 NOTE — Patient Instructions (Signed)

## 2015-03-02 ENCOUNTER — Telehealth: Payer: Self-pay | Admitting: Nurse Practitioner

## 2015-03-02 NOTE — Telephone Encounter (Signed)
MD added to schedule per 07/05 moved labs/flush over to same day as MD request. Pt to p/u schedule at next visit... KJ

## 2015-03-02 NOTE — Telephone Encounter (Signed)
Pt's husband called to find out about [place] for today, gave him Central Scheduling # advised to call they are the ones that schedule..... KJ

## 2015-03-03 ENCOUNTER — Ambulatory Visit: Payer: 59

## 2015-03-04 ENCOUNTER — Ambulatory Visit (HOSPITAL_COMMUNITY)
Admission: RE | Admit: 2015-03-04 | Discharge: 2015-03-04 | Disposition: A | Discharge status: Home / Self Care | Payer: 59 | Source: Ambulatory Visit | Attending: Nurse Practitioner | Admitting: Nurse Practitioner

## 2015-03-04 DIAGNOSIS — R188 Other ascites: Secondary | ICD-10-CM | POA: Diagnosis not present

## 2015-03-04 DIAGNOSIS — C3491 Malignant neoplasm of unspecified part of right bronchus or lung: Secondary | ICD-10-CM

## 2015-03-04 NOTE — Procedures (Signed)
Successful US guided paracentesis from [facility].  Yielded 1.8 liters of clear/light yellow fluid.  No immediate complications.  Pt tolerated well.   Specimen was sent for labs.  Tsosie Billing D PA-C 03/04/2015 11:23 AM

## 2015-03-07 ENCOUNTER — Ambulatory Visit (HOSPITAL_BASED_OUTPATIENT_CLINIC_OR_DEPARTMENT_OTHER): Payer: 59

## 2015-03-07 ENCOUNTER — Ambulatory Visit (HOSPITAL_BASED_OUTPATIENT_CLINIC_OR_DEPARTMENT_OTHER): Payer: 59 | Admitting: Physician Assistant

## 2015-03-07 ENCOUNTER — Ambulatory Visit (HOSPITAL_COMMUNITY)
Admission: RE | Admit: 2015-03-07 | Discharge: 2015-03-07 | Disposition: A | Discharge status: Home / Self Care | Payer: 59 | Source: Ambulatory Visit | Attending: Physician Assistant | Admitting: Physician Assistant

## 2015-03-07 ENCOUNTER — Encounter: Payer: Self-pay | Admitting: Physician Assistant

## 2015-03-07 ENCOUNTER — Other Ambulatory Visit (HOSPITAL_BASED_OUTPATIENT_CLINIC_OR_DEPARTMENT_OTHER): Payer: 59

## 2015-03-07 ENCOUNTER — Telehealth: Payer: Self-pay | Admitting: Physician Assistant

## 2015-03-07 VITALS — BP 133/81 | HR 104 | Temp 98.6°F | Resp 17 | Ht 62.0 in | Wt 126.0 lb

## 2015-03-07 DIAGNOSIS — J90 Pleural effusion, not elsewhere classified: Secondary | ICD-10-CM

## 2015-03-07 DIAGNOSIS — J948 Other specified pleural conditions: Secondary | ICD-10-CM

## 2015-03-07 DIAGNOSIS — Z95828 Presence of other vascular implants and grafts: Secondary | ICD-10-CM

## 2015-03-07 DIAGNOSIS — C787 Secondary malignant neoplasm of liver and intrahepatic bile duct: Secondary | ICD-10-CM

## 2015-03-07 DIAGNOSIS — C3491 Malignant neoplasm of unspecified part of right bronchus or lung: Secondary | ICD-10-CM

## 2015-03-07 DIAGNOSIS — Z452 Encounter for adjustment and management of vascular access device: Secondary | ICD-10-CM

## 2015-03-07 DIAGNOSIS — Z85118 Personal history of other malignant neoplasm of bronchus and lung: Secondary | ICD-10-CM | POA: Insufficient documentation

## 2015-03-07 DIAGNOSIS — C3431 Malignant neoplasm of lower lobe, right bronchus or lung: Secondary | ICD-10-CM

## 2015-03-07 DIAGNOSIS — R188 Other ascites: Secondary | ICD-10-CM | POA: Diagnosis not present

## 2015-03-07 LAB — CBC WITH DIFFERENTIAL/PLATELET
BASO%: 0 % (ref 0.0–2.0)
Basophils Absolute: 0 10*3/uL (ref 0.0–0.1)
EOS%: 0.5 % (ref 0.0–7.0)
Eosinophils Absolute: 0.1 10*3/uL (ref 0.0–0.5)
HCT: 33.5 % — ABNORMAL LOW (ref 34.8–46.6)
HGB: 10.7 g/dL — ABNORMAL LOW (ref 11.6–15.9)
LYMPH%: 19.7 % (ref 14.0–49.7)
MCH: 32.4 pg (ref 25.1–34.0)
MCHC: 31.9 g/dL (ref 31.5–36.0)
MCV: 101.5 fL — ABNORMAL HIGH (ref 79.5–101.0)
MONO#: 0.9 10*3/uL (ref 0.1–0.9)
MONO%: 10.2 % (ref 0.0–14.0)
NEUT#: 6.4 10*3/uL (ref 1.5–6.5)
NEUT%: 69.6 % (ref 38.4–76.8)
Platelets: 143 10*3/uL — ABNORMAL LOW (ref 145–400)
RBC: 3.3 10*6/uL — ABNORMAL LOW (ref 3.70–5.45)
RDW: 18.3 % — ABNORMAL HIGH (ref 11.2–14.5)
WBC: 9.2 10*3/uL (ref 3.9–10.3)
lymph#: 1.8 10*3/uL (ref 0.9–3.3)

## 2015-03-07 LAB — COMPREHENSIVE METABOLIC PANEL (CC13)
ALBUMIN: 2.3 g/dL — AB (ref 3.5–5.0)
ALT: 30 U/L (ref 0–55)
AST: 50 U/L — ABNORMAL HIGH (ref 5–34)
Alkaline Phosphatase: 214 U/L — ABNORMAL HIGH (ref 40–150)
Anion Gap: 8 mEq/L (ref 3–11)
BUN: 14.6 mg/dL (ref 7.0–26.0)
CO2: 24 mEq/L (ref 22–29)
CREATININE: 0.9 mg/dL (ref 0.6–1.1)
Calcium: 8.4 mg/dL (ref 8.4–10.4)
Chloride: 107 mEq/L (ref 98–109)
EGFR: 69 mL/min/{1.73_m2} — AB (ref >90)
GLUCOSE: 158 mg/dL — AB (ref 70–140)
Potassium: 3.9 mEq/L (ref 3.5–5.1)
SODIUM: 139 meq/L (ref 136–145)
Total Bilirubin: 0.62 mg/dL (ref 0.20–1.20)
Total Protein: 5.1 g/dL — ABNORMAL LOW (ref 6.4–8.3)

## 2015-03-07 MED ORDER — HEPARIN SOD (PORK) LOCK FLUSH 100 UNIT/ML IV SOLN
500.0000 [IU] | Freq: Once | INTRAVENOUS | Status: AC
  Administered 2015-03-07: 500 [IU] via INTRAVENOUS
  Filled 2015-03-07: qty 5

## 2015-03-07 MED ORDER — SODIUM CHLORIDE 0.9 % IJ SOLN
10.0000 mL | INTRAMUSCULAR | Status: DC | PRN
  Administered 2015-03-07: 10 mL via INTRAVENOUS
  Filled 2015-03-07: qty 10

## 2015-03-07 NOTE — Patient Instructions (Signed)

## 2015-03-07 NOTE — Progress Notes (Addendum)
[place] OFFICE PROGRESS NOTE   DIAGNOSIS: Stage IV (T1b, N2, M1b) non-small cell lung cancer, adenocarcinoma, presented with a right lower lobe lung mass as well as mediastinal, hilar, bilateral supraclavicular lymphadenopathy in addition to extensive liver metastases and retroperitoneal lymphadenopathy diagnosed in December of 2014.  MOLECULAR BIOMARKERS: [facility] ONE biomarker testing revealed TP53 E204*, KEAP1 R68f*25+, NFKBIA amplification, NKX2-1 amplification, SMARCA4 E463*. The patient was negative for EGFR and ALK gene translocation   PRIOR THERAPY:  1) Systemic chemotherapy with carboplatin for AUC of 5 and Alimta 500 mg/M2 every 3 weeks, status post 3 cycles. First dose 09/02/2013.  2) Systemic chemotherapy with cisplatin 60 mg/M2 on days 1 and gemcitabine 800 mg/M2 on days 1 and 8 every 3 weeks, first cycle on 11/12/2013. Status post 12 cycles. Discontinued secondary to hypersensitivity reaction to cisplatin during cycle #12. 3) Systemic chemotherapy with gemcitabine 1000 mg/M2 on days 1 and 8 every 3 weeks, first cycle on 07/29/2014. Status post 3 cycles discontinued today secondary to disease progression. 4) Immunotherapy with Nivolumab 3 MG/KG every 2 weeks. First cycle 10/05/2014. Status post 4 cycles. Discontinued secondary to disease progression.  CURRENT THERAPY: Systemic chemotherapy with docetaxel 75 mg/m2 and Cyramza 10 mg/kg given every 3 weeks with Neulasta support. Status post 4 cycles.  INTERVAL HISTORY:   Ms. KMatterareturns as scheduled for reevaluation. She underwent paracentesis of 1.8 L of fluid on 03/04/2015. Cytology revealed reactive mesothelial cells. She states she initially felt significantly better but now feels that her abdomen is become distended again. She complains of her eyes being very watery. She has had some loose stools, looser than usual after eating [employer] on Friday. She also notes some incontinence of both stool and  urine when she has severe coughing attacks. The coughing seems to be worse at night.   Her husband notes that her abdomen appears to be distended since her paracentesis on 03/04/2015..Marland KitchenShe continues to have intermittent abdominal pain. The pain worsens when she coughs. She has stable dyspnea on exertion. She has had some nausea and vomiting.  Energy level is poor. Her balance remains worse. Neuropathy symptoms are worse. Her husband notes she is sleeping more. Appetite continues to be poor, although she sometimes has good days where she eats well.  Objective:  Vital signs in last 24 hours:  Blood pressure 133/81, pulse 104, temperature 98.6 F (37 C), temperature source Oral, resp. rate 17, height 5' 2"  (1.575 m), weight 126 lb (57.153 kg), SpO2 98 %.    HEENT: No thrush or ulcers. Resp: Diminished breath sounds right lower to mid lung fields.  Cardio: Regular rate and rhythm. GI: Abdomen is distended. Bowel sounds active. No hepatomegaly or splenomegaly. Vascular: Trace lower leg edema bilaterally. Neuro: Hard of hearing. Alert. Oriented. Speech is fluent.   Port-A-Cath without erythema.  Lab Results:  Lab Results  Component Value Date   WBC 9.2 03/07/2015   HGB 10.7* 03/07/2015   HCT 33.5* 03/07/2015   MCV 101.5* 03/07/2015   PLT 143* 03/07/2015   NEUTROABS 6.4 03/07/2015    Imaging:  Dg Chest 2 View  03/07/2015   CLINICAL DATA:  Right pleural effusion.  History of lung cancer.  EXAM: CHEST  2 VIEW  COMPARISON:  Abdominal radiograph dated 02/17/2015 and CT scan dated 02/04/2015  FINDINGS: Moderate right pleural effusion does not appear appreciably increased. Some of the fluid has now moved along the fissures. There is a new small left effusion. Power port in place.  Heart size and pulmonary vascularity are normal. No osseous abnormality.  IMPRESSION: No appreciable increase in the moderate right pleural effusion. New small left effusion   Electronically Signed   By: Lorriane Shire  M.D.   On: 03/07/2015 16:50    Medications: I have reviewed the patient's current medications.  Assessment/Plan: 1. Stage IV non-small cell lung cancer, adenocarcinoma status post several chemotherapy regimens including carboplatin and Alimta, cisplatin and gemcitabine as well as single agent gemcitabine and most recently treatment with immunotherapy with Nivolumab status post 4 cycles with disease progression. Treatment initiated with docetaxel and cyramza 12/07/2014. She completed cycle 4 on 02/08/2015.    Disposition: Ms. Drahos has completed 4 cycles of docetaxel and cyramza. She is general is not feeling well.  She continues to have abdominal pain and has distention on exam today. Her balance has worsened. We continue to hold cycle 5  For now.patient was discussed with and also seen by Dr. Julien Nordmann. As she is distended again today we will schedule her for another paracentesis with the fluid being sent off for cytology. We will also check a chest x-ray today to see if the right pleural effusion has gotten any worse in May need thoracentesis. If this is the case we will try to have both procedures done on the same day. We will plan to see her weekly and monitor her very closely. Her blood pressure medication has been held due to hypotension. Blood pressure today is 133/81. I have advised Mr. Laidler to monitor her blood pressure at home and let us know if it either increases dramatically or decreases dramatically.  Addendum at 5:20 PM the 2 view chest x-ray revealed no increase in the right pleural effusion and a new small left pleural effusion. The thoracentesis can be postponed for now. She will have a paracentesis hopefully on 03/09/2015.   Carlton Adam, PA-C 03/07/2015  5:14 PM  ADDENDUM: Hematology/Oncology Attending: I had a face to face encounter with the patient. I recommended her care plan. This is a very pleasant 64 years old white female with a stage IV non-small cell lung cancer,  adenocarcinoma status post several chemotherapy regimens and currently on treatment with docetaxel and Cyramza status post 4 cycles. The patient has been complaining of increasing fatigue and weakness as well as shortness of breath and abdominal distention. She recently underwent ultrasound-guided paracentesis with drainage of 1.8 L of peritoneal fluid. The cytology was negative for malignancy. The patient feels that she is commuting fluid again in her abdomen. We will arrange for the patient to have another ultrasound guided thoracentesis in 2 days. We will also check chest x-ray to evaluate the right pleural effusion and will consider her for right sided thoracentesis if needed. She would come back for follow-up visit in one week for reevaluation and management of her condition. The patient was advised to call immediately if she has any concerning symptoms in the interval.  Disclaimer: This note was dictated with voice recognition software. Similar sounding words can inadvertently be transcribed and may be missed upon review. Eilleen Kempf., MD 03/09/2015

## 2015-03-07 NOTE — Patient Instructions (Signed)
You will be scheduled for a paracentesis later this week Your Chest X-Ray did not show any increase in the right pleural effusion and a ne small left pleural effusion - no intervention is needed at this time Follow up in one week

## 2015-03-07 NOTE — Telephone Encounter (Signed)
per pof to sch pt appt-gave pt copy of calendar per req-and avs

## 2015-03-08 ENCOUNTER — Other Ambulatory Visit: Payer: 59

## 2015-03-09 ENCOUNTER — Other Ambulatory Visit: Payer: Self-pay | Admitting: Internal Medicine

## 2015-03-09 ENCOUNTER — Other Ambulatory Visit: Payer: 59

## 2015-03-09 ENCOUNTER — Telehealth: Payer: Self-pay | Admitting: *Deleted

## 2015-03-09 NOTE — Telephone Encounter (Signed)
TC from pt's husband. He states that Amber Hayden's paracentesis is not scheduled until Monday, 03/14/15. He is asking if it can be moved up sooner as she is uncomfortable with the increase in fluid in her abdomen. Husband also asking if she will have a thoracentesis too. Reviewed note from Awilda Metro, [place] from 03/07/15. CXR reveals stable pleural effusion at this time and will not need to be tapped.   Call made to Radiology scheduling and was able to get paracentesis moved to Friday, 03/11/15 @ 10:15 2 [facility].  Call back to husband, Amber Hayden and informed him of no need for thoracentesis and that paracentesis in now on Friday. Gave him the time to be at radiology. He expressed his gratitude for changing the date and time.

## 2015-03-10 ENCOUNTER — Other Ambulatory Visit: Payer: Self-pay | Admitting: Physician Assistant

## 2015-03-10 DIAGNOSIS — C3491 Malignant neoplasm of unspecified part of right bronchus or lung: Secondary | ICD-10-CM

## 2015-03-10 MED ORDER — DEXAMETHASONE 4 MG PO TABS
ORAL_TABLET | ORAL | Status: AC
Start: 2015-03-10 — End: ?

## 2015-03-11 ENCOUNTER — Ambulatory Visit (HOSPITAL_COMMUNITY)
Admission: RE | Admit: 2015-03-11 | Discharge: 2015-03-11 | Disposition: A | Discharge status: Home / Self Care | Payer: 59 | Source: Ambulatory Visit | Attending: Physician Assistant | Admitting: Physician Assistant

## 2015-03-11 DIAGNOSIS — C3491 Malignant neoplasm of unspecified part of right bronchus or lung: Secondary | ICD-10-CM

## 2015-03-11 DIAGNOSIS — C3490 Malignant neoplasm of unspecified part of unspecified bronchus or lung: Secondary | ICD-10-CM | POA: Insufficient documentation

## 2015-03-11 DIAGNOSIS — R188 Other ascites: Secondary | ICD-10-CM

## 2015-03-11 LAB — BODY FLUID CELL COUNT WITH DIFFERENTIAL
Lymphs, Fluid: 30 %
Monocyte-Macrophage-Serous Fluid: 60 % (ref 50–90)
NEUTROPHIL FLUID: 10 % (ref 0–25)
WBC FLUID: 96 uL (ref 0–1000)

## 2015-03-11 LAB — GRAM STAIN: GRAM STAIN: NONE SEEN

## 2015-03-11 NOTE — Procedures (Signed)
Ultrasound-guided diagnostic and therapeutic paracentesis performed yielding 1.4 L of cloudy, light yellow fluid. A portion of the fluid was submitted to the laboratory for preordered studies. No immediate complications.

## 2015-03-14 ENCOUNTER — Other Ambulatory Visit: Payer: 59

## 2015-03-14 ENCOUNTER — Ambulatory Visit (HOSPITAL_COMMUNITY): Payer: 59

## 2015-03-15 ENCOUNTER — Encounter: Payer: Self-pay | Admitting: *Deleted

## 2015-03-15 ENCOUNTER — Other Ambulatory Visit: Payer: 59

## 2015-03-15 ENCOUNTER — Telehealth: Payer: Self-pay | Admitting: Internal Medicine

## 2015-03-15 ENCOUNTER — Other Ambulatory Visit (HOSPITAL_BASED_OUTPATIENT_CLINIC_OR_DEPARTMENT_OTHER): Payer: 59

## 2015-03-15 ENCOUNTER — Ambulatory Visit (HOSPITAL_BASED_OUTPATIENT_CLINIC_OR_DEPARTMENT_OTHER): Payer: 59

## 2015-03-15 ENCOUNTER — Telehealth: Payer: Self-pay | Admitting: Medical Oncology

## 2015-03-15 ENCOUNTER — Ambulatory Visit (HOSPITAL_BASED_OUTPATIENT_CLINIC_OR_DEPARTMENT_OTHER): Payer: 59 | Admitting: Internal Medicine

## 2015-03-15 ENCOUNTER — Encounter: Payer: Self-pay | Admitting: Internal Medicine

## 2015-03-15 VITALS — BP 135/82 | HR 100 | Temp 97.9°F | Resp 17 | Ht 62.0 in | Wt 127.4 lb

## 2015-03-15 DIAGNOSIS — C3431 Malignant neoplasm of lower lobe, right bronchus or lung: Secondary | ICD-10-CM

## 2015-03-15 DIAGNOSIS — C3491 Malignant neoplasm of unspecified part of right bronchus or lung: Secondary | ICD-10-CM

## 2015-03-15 DIAGNOSIS — R188 Other ascites: Secondary | ICD-10-CM

## 2015-03-15 DIAGNOSIS — Z95828 Presence of other vascular implants and grafts: Secondary | ICD-10-CM

## 2015-03-15 LAB — CBC WITH DIFFERENTIAL/PLATELET
BASO%: 0.1 % (ref 0.0–2.0)
Basophils Absolute: 0 10*3/uL (ref 0.0–0.1)
EOS ABS: 0.1 10*3/uL (ref 0.0–0.5)
EOS%: 0.7 % (ref 0.0–7.0)
HCT: 35.8 % (ref 34.8–46.6)
HGB: 11.6 g/dL (ref 11.6–15.9)
LYMPH#: 1.5 10*3/uL (ref 0.9–3.3)
LYMPH%: 15.4 % (ref 14.0–49.7)
MCH: 32.8 pg (ref 25.1–34.0)
MCHC: 32.4 g/dL (ref 31.5–36.0)
MCV: 101.1 fL — ABNORMAL HIGH (ref 79.5–101.0)
MONO#: 0.9 10*3/uL (ref 0.1–0.9)
MONO%: 9.1 % (ref 0.0–14.0)
NEUT%: 74.7 % (ref 38.4–76.8)
NEUTROS ABS: 7.5 10*3/uL — AB (ref 1.5–6.5)
PLATELETS: 137 10*3/uL — AB (ref 145–400)
RBC: 3.54 10*6/uL — ABNORMAL LOW (ref 3.70–5.45)
RDW: 17.5 % — ABNORMAL HIGH (ref 11.2–14.5)
WBC: 10 10*3/uL (ref 3.9–10.3)
nRBC: 0 % (ref 0–0)

## 2015-03-15 LAB — COMPREHENSIVE METABOLIC PANEL (CC13)
ALT: 59 U/L — AB (ref 0–55)
AST: 60 U/L — ABNORMAL HIGH (ref 5–34)
Albumin: 2.2 g/dL — ABNORMAL LOW (ref 3.5–5.0)
Alkaline Phosphatase: 305 U/L — ABNORMAL HIGH (ref 40–150)
Anion Gap: 8 mEq/L (ref 3–11)
BUN: 22.8 mg/dL (ref 7.0–26.0)
CALCIUM: 8.4 mg/dL (ref 8.4–10.4)
CO2: 23 meq/L (ref 22–29)
Chloride: 110 mEq/L — ABNORMAL HIGH (ref 98–109)
Creatinine: 0.8 mg/dL (ref 0.6–1.1)
EGFR: 82 mL/min/{1.73_m2} — AB (ref >90)
Glucose: 130 mg/dl (ref 70–140)
Potassium: 3.6 mEq/L (ref 3.5–5.1)
Sodium: 141 mEq/L (ref 136–145)
Total Bilirubin: 0.47 mg/dL (ref 0.20–1.20)
Total Protein: 5 g/dL — ABNORMAL LOW (ref 6.4–8.3)

## 2015-03-15 MED ORDER — HEPARIN SOD (PORK) LOCK FLUSH 100 UNIT/ML IV SOLN
500.0000 [IU] | Freq: Once | INTRAVENOUS | Status: AC
  Administered 2015-03-15: 500 [IU] via INTRAVENOUS
  Filled 2015-03-15: qty 5

## 2015-03-15 MED ORDER — SODIUM CHLORIDE 0.9 % IJ SOLN
10.0000 mL | INTRAMUSCULAR | Status: DC | PRN
  Administered 2015-03-15: 10 mL via INTRAVENOUS
  Filled 2015-03-15: qty 10

## 2015-03-15 NOTE — Telephone Encounter (Signed)
Notes faxed to hospice

## 2015-03-15 NOTE — Telephone Encounter (Signed)
Per 7/19 pof cx all chemo and appointments next week. Lab/fu 2 weeks. Gave patient husband avs report and appointments for August.

## 2015-03-15 NOTE — Patient Instructions (Signed)

## 2015-03-15 NOTE — Progress Notes (Signed)
Oncology Nurse Navigator Documentation  Oncology Nurse Navigator Flowsheets 03/15/2015  Navigator Encounter Type 6 month;Other  Patient Visit Type Medonc  Treatment Phase Treatment/Patient has been declining recently.  Dr. Julien Nordmann spoke to patient and husband about hospice/palliative care.  All questions and concerns were addressed.  They are both positive and would like follow up appt with Dr. Julien Nordmann.  He has written POF for follow up.    Barriers/Navigation Needs Education  Interventions Other  Time Spent with Patient 15

## 2015-03-15 NOTE — Progress Notes (Signed)
[place] [phone]   [phone]  OFFICE PROGRESS NOTE  Mathews Argyle, MD [address] [place] [address]  DIAGNOSIS: Stage IV (T1b, N2, M1b) non-small cell lung cancer, adenocarcinoma, presented with a right lower lobe lung mass as well as mediastinal, hilar, bilateral supraclavicular lymphadenopathy in addition to extensive liver metastases and retroperitoneal lymphadenopathy diagnosed in December of 2014.  MOLECULAR BIOMARKERS: [facility] biomarker testing revealed TP53 E204*, KEAP1 R630f*25+, NFKBIA amplification, NKX2-1 amplification, SMARCA4 E463*. The patient was negative for EGFR and ALK gene translocation   PRIOR THERAPY:  1) Systemic chemotherapy with carboplatin for AUC of 5 and Alimta 500 mg/M2 every 3 weeks, status post 3 cycles. First dose 09/02/2013.  2)  Systemic chemotherapy with cisplatin 60 mg/M2 on days 1 and gemcitabine 800 mg/M2 on days 1 and 8 every 3 weeks, first cycle on 11/12/2013. Status post 12 cycles. Discontinued secondary to hypersensitivity reaction to cisplatin during cycle #12. 3) Systemic chemotherapy with gemcitabine 1000 mg/M2 on days 1 and 8 every 3 weeks, first cycle on 07/29/2014. Status post 3 cycles discontinued today secondary to disease progression. 4) Immunotherapy with Nivolumab 3 MG/KG every 2 weeks. First cycle 10/05/2014. Status post 4 cycles. Discontinued secondary to disease progression.  CURRENT THERAPY: Systemic chemotherapy with docetaxel 75 mg/m2 and Cyramza 10 mg/kg given every 3 weeks with Neulasta support. Status post 4 cycles.  CHEMOTHERAPY INTENT: palliative  CURRENT # OF CHEMOTHERAPY CYCLES: 4  CURRENT ANTIEMETICS: Zofran, dexamethasone and Compazine.  CURRENT SMOKING STATUS: former smoker  ORAL CHEMOTHERAPY AND CONSENT: None  CURRENT BISPHOSPHONATES USE: None  PAIN MANAGEMENT: 0/10  NARCOTICS INDUCED CONSTIPATION: None  LIVING WILL AND CODE  STATUS: Full Code  INTERVAL HISTORY: JTIOMBE [id number].o. female returns to the clinic today for follow up visit accompanied by her husband. The patient has been complaining of increasing fatigue and weakness as well as shortness breath with exertion and abdominal distention recently. She underwent ultrasound-guided paracentesis twice over the last 10 days. The last paracentesis pain 1.4 L of ascitic fluid and the final cytology was negative for malignancy. The patient has been off treatment with chemotherapy for several weeks now secondary to decline of her condition. The patient denied having any significant nausea or vomiting. She denied having any fever or chills. She has no significant weight loss or night sweats. She denied having any significant chest pain, cough or hemoptysis. She is here today for evaluation and discussion of her condition and treatment options.  MEDICAL HISTORY: Past Medical History  Diagnosis Date  . HTN (hypertension)   . lung ca dx'd 07/2013    ALLERGIES:  is allergic to cisplatin; hctz; and plendil.  MEDICATIONS:  Current Outpatient Prescriptions  Medication Sig Dispense Refill  . amLODipine (NORVASC) 5 MG tablet     . aspirin EC 81 MG tablet Take 81 mg by mouth every morning.     . calcium carbonate (TUMS - DOSED IN MG ELEMENTAL CALCIUM) 500 MG chewable tablet Chew 1 tablet by mouth 3 (three) times daily as needed for indigestion or heartburn.     . dexamethasone (DECADRON) 4 MG tablet Take 2 tablets by mouth twice daily with food the day before, the day of, and the day after chemotherapy 50 tablet 0  . DULoxetine (CYMBALTA) 30 MG capsule Take 1 capsule (30 mg total) by mouth daily. 90 capsule 1  . lidocaine-prilocaine (EMLA) cream Apply topically once. 30 g 0  .  lisinopril (PRINIVIL,ZESTRIL) 20 MG tablet Take 20 mg by mouth every morning.     . loratadine (CLARITIN) 10 MG tablet Take as directed 30 tablet 1  . nebivolol (BYSTOLIC) 10 MG tablet Take 10 mg by  mouth every morning.     Marland Kitchen omeprazole (PRILOSEC) 40 MG capsule TAKE 1 CAPSULE (40 MG TOTAL) BY MOUTH DAILY. 90 capsule 1  . oxyCODONE (ROXICODONE) 5 MG immediate release tablet Take 1 tablet (5 mg total) by mouth every 6 (six) hours as needed for severe pain. 60 tablet 0  . prochlorperazine (COMPAZINE) 10 MG tablet Take 1 tablet (10 mg total) by mouth every 6 (six) hours as needed for nausea or vomiting. 90 tablet 1   No current facility-administered medications for this visit.   Facility-Administered Medications Ordered in Other Visits  Medication Dose Route Frequency Provider Last Rate Last Dose  . sodium chloride 0.9 % injection 10 mL  10 mL Intracatheter PRN Curt Bears, MD   10 mL at 11/16/14 1625    REVIEW OF SYSTEMS:  Constitutional: positive for anorexia and fatigue Eyes: negative Ears, nose, mouth, throat, and face: negative Respiratory: positive for dyspnea on exertion Cardiovascular: negative Gastrointestinal: positive for Abdominal distention Genitourinary:negative Integument/breast: negative Hematologic/lymphatic: negative Musculoskeletal:negative Neurological: positive for paresthesia Behavioral/Psych: negative Endocrine: negative Allergic/Immunologic: negative   PHYSICAL EXAMINATION: General appearance: alert, cooperative and no distress Head: Normocephalic, without obvious abnormality, atraumatic Neck: no adenopathy, no JVD, supple, symmetrical, trachea midline and thyroid not enlarged, symmetric, no tenderness/mass/nodules Lymph nodes: Cervical, supraclavicular, and axillary nodes normal. Resp: clear to auscultation bilaterally Back: symmetric, no curvature. ROM normal. No CVA tenderness. Cardio: regular rate and rhythm, S1, S2 normal, no murmur, click, rub or gallop GI: soft, non-tender; bowel sounds normal; no masses,  no organomegaly Extremities: extremities normal, atraumatic, no cyanosis or edema Neurologic: Alert and oriented X 3, normal strength and  tone. Normal symmetric reflexes. Normal coordination and gait  ECOG PERFORMANCE STATUS: 2 - Symptomatic, <50% confined to bed  Blood pressure 135/82, pulse 100, temperature 97.9 F (36.6 C), temperature source Oral, resp. rate 17, height 5' 2"  (1.575 m), weight 127 lb 6.4 oz (57.788 kg), SpO2 100 %.  LABORATORY DATA: Lab Results  Component Value Date   WBC 10.0 03/15/2015   HGB 11.6 03/15/2015   HCT 35.8 03/15/2015   MCV 101.1* 03/15/2015   PLT 137* 03/15/2015      Chemistry      Component Value Date/Time   NA 141 03/15/2015 0847   NA 135 05/13/2014 1007   K 3.6 03/15/2015 0847   K 4.5 05/13/2014 1007   CL 96 05/13/2014 1007   CO2 23 03/15/2015 0847   CO2 29 05/13/2014 1007   BUN 22.8 03/15/2015 0847   BUN 18 05/13/2014 1007   CREATININE 0.8 03/15/2015 0847   CREATININE 1.00 05/13/2014 1007      Component Value Date/Time   CALCIUM 8.4 03/15/2015 0847   CALCIUM 9.5 05/13/2014 1007       RADIOGRAPHIC STUDIES: Dg Chest 2 View  03/07/2015   CLINICAL DATA:  Right pleural effusion.  History of lung cancer.  EXAM: CHEST  2 VIEW  COMPARISON:  Abdominal radiograph dated 02/17/2015 and CT scan dated 02/04/2015  FINDINGS: Moderate right pleural effusion does not appear appreciably increased. Some of the fluid has now moved along the fissures. There is a new small left effusion. Power port in place. Heart size and pulmonary vascularity are normal. No osseous abnormality.  IMPRESSION: No appreciable increase in  the moderate right pleural effusion. New small left effusion   Electronically Signed   By: Lorriane Shire M.D.   On: 03/07/2015 16:50   Ct Head W Wo Contrast  03/01/2015   CLINICAL DATA:  Small cell lung cancer.  EXAM: CT HEAD WITHOUT AND WITH CONTRAST  TECHNIQUE: Contiguous axial images were obtained from the base of the skull through the vertex without and with intravenous contrast  CONTRAST:  154m OMNIPAQUE IOHEXOL 300 MG/ML  SOLN  COMPARISON:  MRI brain 12/09/2014  FINDINGS:  A 6 mm focus of enhancement is noted in the anterior left frontal lobe on image 14 of series 4. A high right parietal lobe lesion measures 6 mm on image 26 of series 4. There may be a punctate area of enhancement in the posterior right frontal lobe I would image 27. There is no significant edema associated with these areas. Areas of age advanced chronic subcortical and periventricular white matter changes are evident bilaterally.  No acute cortical infarct is present. The basal ganglia are intact. The ventricles are proportionate to the degree of atrophy.  The paranasal sinuses and mastoid air cells are clear. Calvarium is intact.  IMPRESSION: 1. Two 6 cm foci of enhancement are concerning for development of metastatic disease to the brain. Recommend follow-up MRI the brain with and without contrast to confirm these lesions and identify any additional lesions. Please consider 3T imaging if SRS may be utilized. 2. Additional punctate focus in the posterior high right frontal lobe may represent an additional metastasis. 3. Otherwise stable atrophy and white matter disease.   Electronically Signed   By: CSan MorelleM.D.   On: 03/01/2015 15:48   Ct Abdomen Pelvis W Contrast  03/01/2015   CLINICAL DATA:  Metastatic lung cancer with abdominal pain and distention.  EXAM: CT ABDOMEN AND PELVIS WITH CONTRAST  TECHNIQUE: Multidetector CT imaging of the abdomen and pelvis was performed using the standard protocol following bolus administration of intravenous contrast.  CONTRAST:  1061mOMNIPAQUE IOHEXOL 300 MG/ML  SOLN  COMPARISON:  Recent CT scan 02/04/2015  FINDINGS: Lower chest: Enlarging right pleural effusion with near complete right lower lobe atelectasis. The pulmonary lesion is surrounded by compressed long and is not visualized. There is also an no definite pulmonary nodules. The heart is normal in size. No pericardial effusion. Enlarging left pleural effusion.  Hepatobiliary: Stable metastatic disease  involving the liver when compared to study from 02/04/2015. The largest right hepatic lobe lesions are unchanged. Numerous smaller lesions are also stable.  Segment 2 lesion measures 3.1 mm and is unchanged.  Segment 4A lesion on image 15 measures 19 x 19 mm and previously measured 22 x 19 mm.  The largest lesion in the right lobe in segment 5 measures 5.1 cm and previous measured 5.0 cm.  Pancreas: Stable pancreatic atrophy. No mass, inflammation or ductal dilatation.  Spleen: Normal size.  No focal lesions.  Adrenals/Urinary Tract: Stable nodularity of both adrenal glands but no mass. Kidneys are unremarkable and stable.  Stomach/Bowel: The stomach, duodenum and small bowel are unremarkable. No mass lesions or obstructive findings. There is moderate wall thickening and submucosal edema involving the cecum and ascending colon. Similar but less significant findings involving the transverse and descending colon. Some of this could be due to the surrounding ascites but a diffuse colitis is also possible such as C difficile colitis.  Vascular/Lymphatic: Advanced atherosclerotic calcifications involving the aorta and branch vessels. No focal aneurysm or dissection. No mesenteric or retroperitoneal  mass or lymphadenopathy.  Other: Large volume abdominal/ pelvic ascites. Multiple large fibroids are stable.  Musculoskeletal: No significant bony findings.  IMPRESSION: 1. Enlarging pleural effusions and near complete right lower lobe atelectasis. 2. Stable diffuse hepatic metastatic disease. 3. Large volume ascites. 4. Suspect diffuse colitis, possible C difficile colitis. 5. Stable uterine fibroids.   Electronically Signed   By: Marijo Sanes M.D.   On: 03/01/2015 15:53   US Paracentesis  03/11/2015   INDICATION: Stage IV non-small cell lung cancer, recurrent ascites. Request is made for diagnostic and therapeutic paracentesis.  EXAM: ULTRASOUND-GUIDED DIAGNOSTIC/THERAPEUTIC PARACENTESIS  COMPARISON:  Prior paracentesis  on 03/04/15  MEDICATIONS: None.  COMPLICATIONS: None immediate  TECHNIQUE: Informed written consent was obtained from the patient after a discussion of the risks, benefits and alternatives to treatment. A timeout was performed prior to the initiation of the procedure.  Initial ultrasound scanning demonstrates a small to moderate amount of ascites within the right upper to mid abdominal quadrant. The right upper to mid abdomen was prepped and draped in the usual sterile fashion. 1% lidocaine was used for local anesthesia. Under direct ultrasound guidance, a 19 gauge, 10-cm, Yueh catheter was introduced. An ultrasound image was saved for documentation purposed. The paracentesis was performed. The catheter was removed and a dressing was applied. The patient tolerated the procedure well without immediate post procedural complication.  FINDINGS: A total of approximately 1.4 liters of cloudy, light yellow fluid was removed. Samples were sent to the laboratory as requested by the clinical team.  IMPRESSION: Successful ultrasound-guided diagnostic and therapeutic paracentesis yielding 1.4 liters of peritoneal fluid.  Read by: Rowe Robert, PA-C   Electronically Signed   By: Jerilynn Mages.  Shick M.D.   On: 03/11/2015 11:59   US Paracentesis  03/04/2015   INDICATION: Ascites, request for diagnostic and therapeutic paracentesis.  EXAM: ULTRASOUND-GUIDED PARACENTESIS  COMPARISON:  CT Abdomen/Pelvis - 03/01/15.  MEDICATIONS: None.  COMPLICATIONS: None immediate  TECHNIQUE: Informed written consent was obtained from the patient after a discussion of the risks, benefits and alternatives to treatment. A timeout was performed prior to the initiation of the procedure.  Initial ultrasound scanning demonstrates a small amount of ascites within the left lower abdominal quadrant. The left lower abdomen was prepped and draped in the usual sterile fashion. 1% lidocaine was used for local anesthesia.  Under direct ultrasound guidance, a 19 gauge, 7-cm,  Yueh catheter was introduced. An ultrasound image was saved for documentation purposed. The paracentesis was performed. The catheter was removed and a dressing was applied. The patient tolerated the procedure well without immediate post procedural complication.  FINDINGS: A total of approximately 1.8 liters of clear/light yellow fluid was removed. Samples were sent to the laboratory as requested by the clinical team.  IMPRESSION: Successful ultrasound-guided paracentesis yielding 1.8 liters of peritoneal fluid.  Read By:  Tsosie Billing PA-C   Electronically Signed   By: Sandi Mariscal M.D.   On: 03/04/2015 11:26   Dg Abd 2 Views  02/17/2015   CLINICAL DATA:  Lower abdominal pain.  EXAM: ABDOMEN - 2 VIEW  COMPARISON:  CT scan of February 04, 2015.  FINDINGS: The bowel gas pattern is normal. There is no evidence of free air. Phleboliths are noted in the pelvis.  IMPRESSION: No evidence of bowel obstruction or ileus.   Electronically Signed   By: Marijo Conception, M.D.   On: 02/17/2015 15:59   ASSESSMENT AND PLAN: This is a very pleasant 64 years old white female recently  diagnosed with metastatic non-small cell lung cancer, adenocarcinoma presented with right lower lobe lung mass in addition to mediastinal and supraclavicular lymphadenopathy as well as diffuse metastatic liver lesions and retroperitoneal lymphadenopathy. She underwent 3 cycles of systemic chemotherapy with carboplatin and Alimta but unfortunately has evidence for disease progression especially in the liver and new small left adrenal metastasis. She is currently undergoing second line chemotherapy with cisplatin and gemcitabine status post 12 cycles and tolerating it fairly well patient after reducing the dose of cisplatin. This treatment was discontinued after the patient developed hypersensitivity reaction to cisplatin. The patient also completed 3 cycles of chemotherapy with single agent gemcitabine but unfortunately the recent CT scan of the chest,  abdomen and pelvis showed evidence for disease progression especially in the liver. She was then treated with immunotherapy with Nivolumab for 4 cycles but this was discontinued secondary to disease progression. The patient also was treated with 4 cycles of systemic chemotherapy with docetaxel and Cyramza with good response on the scan after 3 cycles but unfortunately she has significant deterioration of her condition and decline in her general status. She is also developing recurrent ascites requiring ultrasound-guided paracentesis at frequent basis. I had a lengthy discussion with the patient and her husband about her current condition and treatment options. I strongly recommended for the patient to consider palliative care and hospice referral at this point. I do think the patient would be a good candidate to continue with systemic chemotherapy at this point. For the recurrent ascites, I will arrange for the patient to have ultrasound-guided paracentesis performed and few days. The patient and her husband agreed to the current plan. We will call the hospice service of [facility] to see the patient. She would come back for follow-up visit in 2 weeks for reevaluation and symptom management. She was advised to call immediately if she has any concerning symptoms in the interval. The patient voices understanding of current disease status and treatment options and is in agreement with the current care plan.  All questions were answered. The patient knows to call the clinic with any problems, questions or concerns. We can certainly see the patient much sooner if necessary.  Disclaimer: This note was dictated with voice recognition software. Similar sounding words can inadvertently be transcribed and may not be corrected upon review.

## 2015-03-15 NOTE — Telephone Encounter (Signed)
Pt referred to hospice

## 2015-03-16 LAB — CULTURE, BODY FLUID-BOTTLE

## 2015-03-16 LAB — CULTURE, BODY FLUID W GRAM STAIN -BOTTLE: Culture: NO GROWTH

## 2015-03-18 ENCOUNTER — Telehealth: Payer: Self-pay | Admitting: Medical Oncology

## 2015-03-18 ENCOUNTER — Ambulatory Visit (HOSPITAL_COMMUNITY)
Admission: RE | Admit: 2015-03-18 | Discharge: 2015-03-18 | Disposition: A | Discharge status: Home / Self Care | Payer: 59 | Source: Ambulatory Visit | Attending: Internal Medicine | Admitting: Internal Medicine

## 2015-03-18 DIAGNOSIS — R188 Other ascites: Secondary | ICD-10-CM

## 2015-03-18 DIAGNOSIS — C3491 Malignant neoplasm of unspecified part of right bronchus or lung: Secondary | ICD-10-CM

## 2015-03-18 NOTE — Telephone Encounter (Signed)
Drained 1 liter from abdomen . FYI -  She also has pleural effusion - she may get symptomatic from pleural effusion.

## 2015-03-18 NOTE — Procedures (Addendum)
Successful US guided paracentesis from RUQ.  Yielded 900 ml of serous fluid.  No immediate complications.  Pt tolerated well.   Specimen was not sent for labs.  Tsosie Billing D PA-C 03/18/2015 2:48 PM  Incidental findings include pleural effusions, the patient denies any shortness of breath or difficulty lying flat at night. She does admit to relief of symptoms after paracentesis today. Dr. Worthy Flank RN was notified.

## 2015-03-22 ENCOUNTER — Other Ambulatory Visit: Payer: 59

## 2015-03-22 ENCOUNTER — Ambulatory Visit: Payer: 59

## 2015-03-22 ENCOUNTER — Ambulatory Visit: Payer: 59 | Admitting: Internal Medicine

## 2015-03-24 ENCOUNTER — Ambulatory Visit: Payer: 59

## 2015-03-25 ENCOUNTER — Telehealth: Payer: Self-pay

## 2015-03-25 NOTE — Telephone Encounter (Signed)
Stephanie LPN from [employer] called requesting order for dressing for skin tear on pt's R arm. She has very fragile skin. Verbal order given to clean with NS, apply non adhering dressing and change prn.

## 2015-03-29 ENCOUNTER — Other Ambulatory Visit: Payer: 59

## 2015-03-29 ENCOUNTER — Ambulatory Visit: Payer: 59 | Admitting: Physician Assistant

## 2015-04-28 DEATH — deceased

## 2016-04-16 IMAGING — CT CT ABD-PELV W/ CM
2 of 5 series · 16 of 46 positions shown, 18 images · IV contrast (OMNIPAQUE)
Comparison: 11/09/2013

CLINICAL DATA: Lung cancer.

EXAM:
CT CHEST, ABDOMEN, AND PELVIS WITH CONTRAST
TECHNIQUE: Multidetector CT imaging of the chest, abdomen and pelvis was
performed following the standard protocol during bolus
administration of intravenous contrast.
CONTRAST:  100mL OMNIPAQUE IOHEXOL 300 MG/ML  SOLN

[Series 2: cap with st · axial · 0.63mm/px · z∈[-566,-41]mm · 13 of 119 slices shown, 15 images]
[im 7/119  soft-tissue]
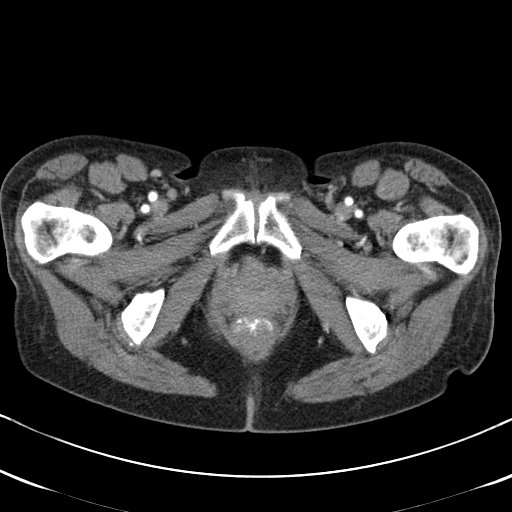
[im 7/119  bone]
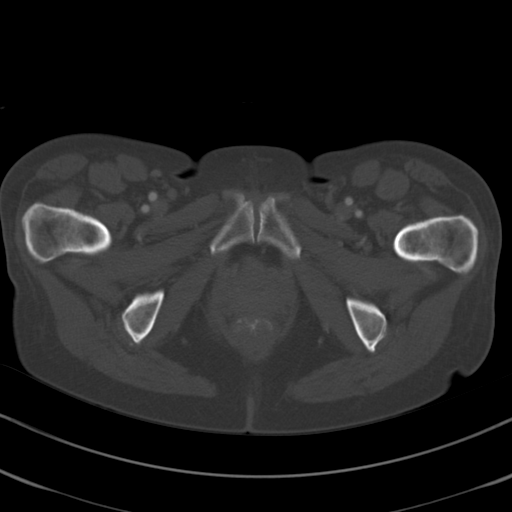
[im 14/119  soft-tissue]
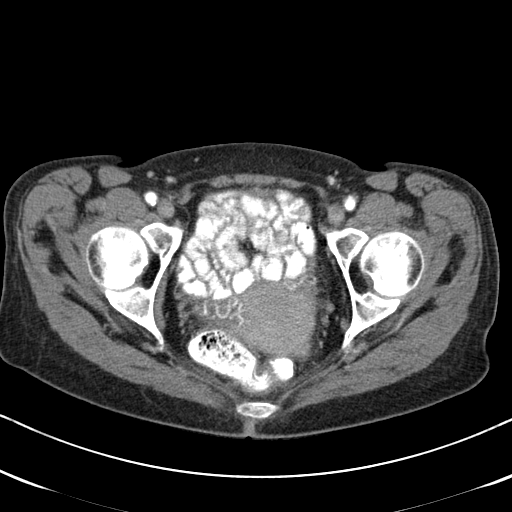
[im 27/119  soft-tissue]
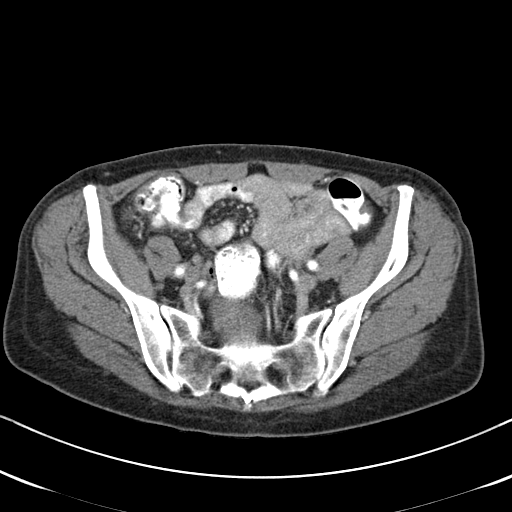
[im 33/119  soft-tissue]
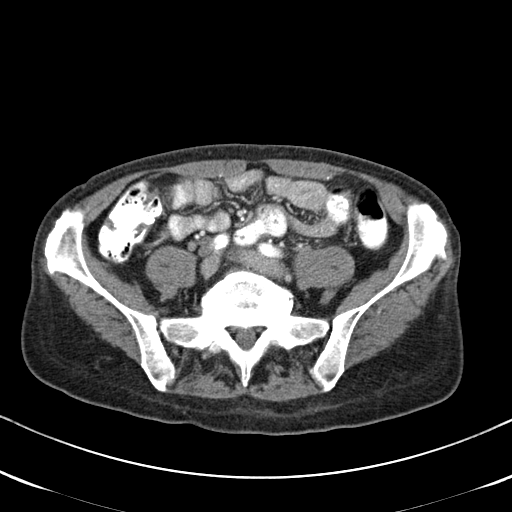
[im 40/119  soft-tissue]
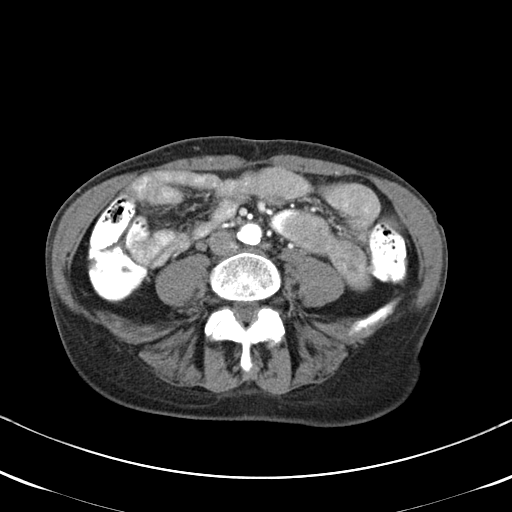
[im 53/119  soft-tissue]
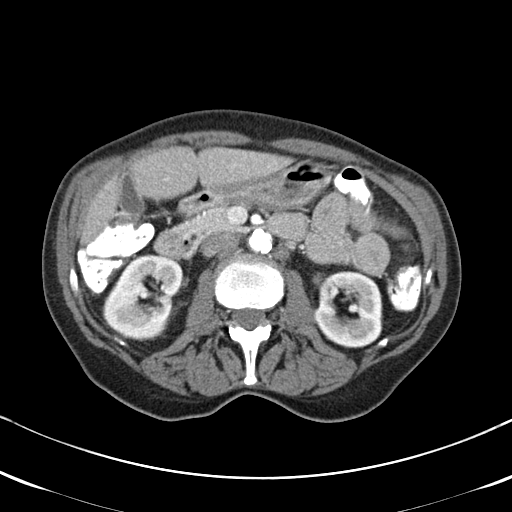
[im 60/119  soft-tissue]
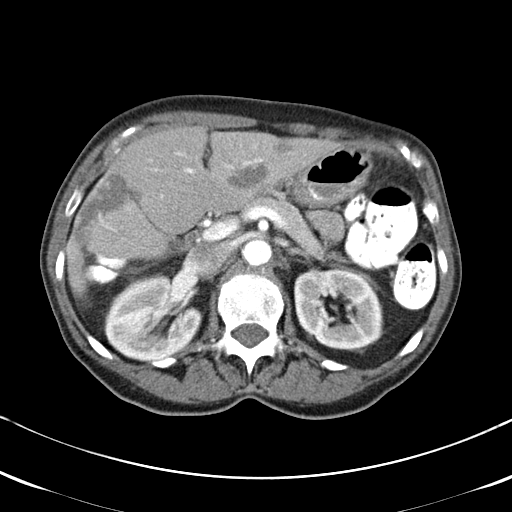
[im 66/119  soft-tissue]
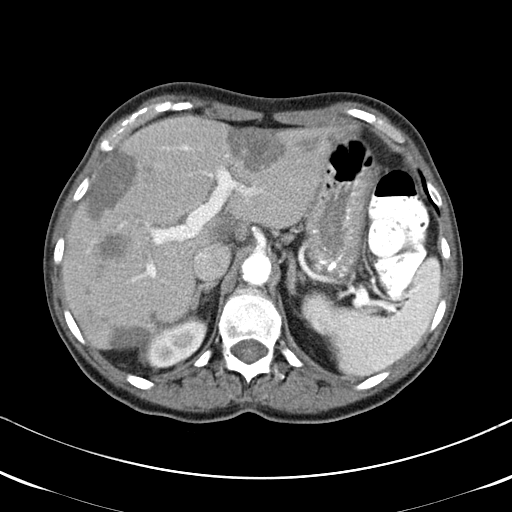
[im 79/119  soft-tissue]
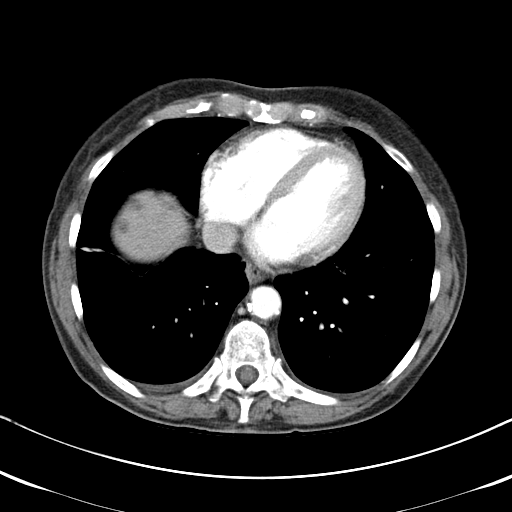
[im 79/119  bone]
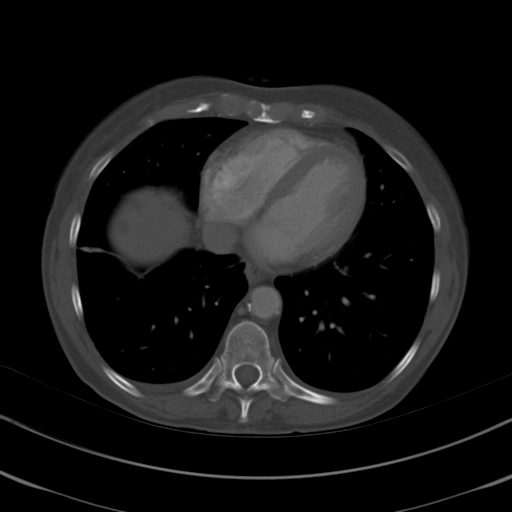
[im 86/119  soft-tissue]
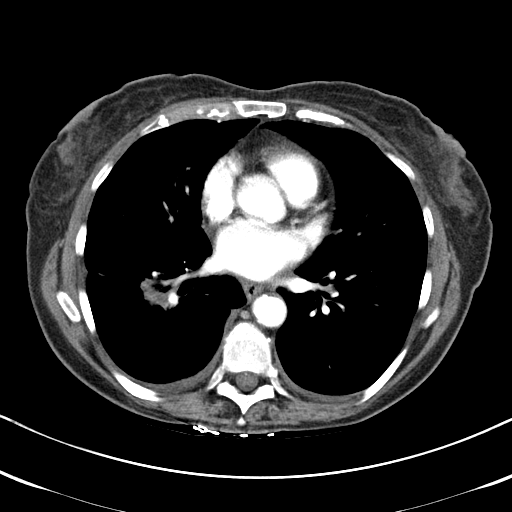
[im 92/119  soft-tissue]
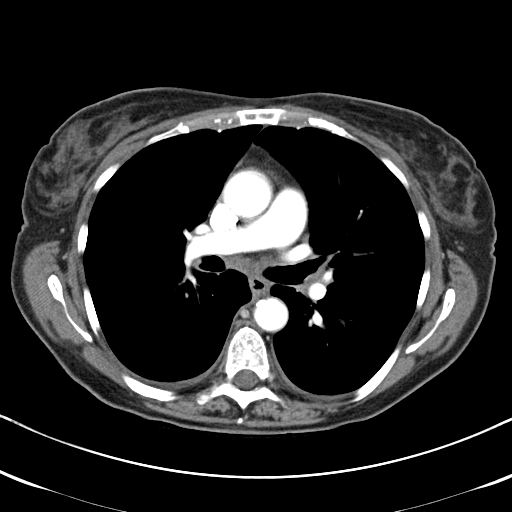
[im 105/119  soft-tissue]
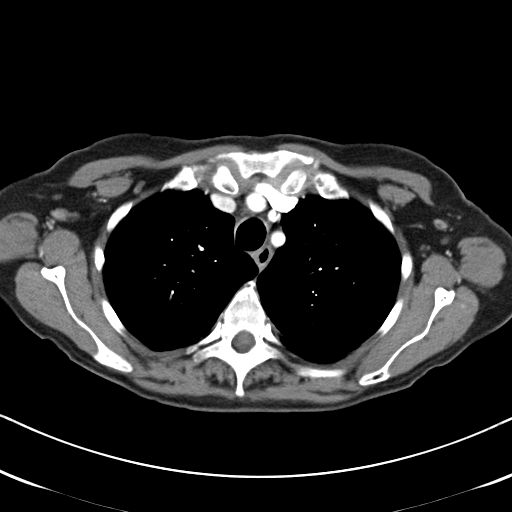
[im 112/119  soft-tissue]
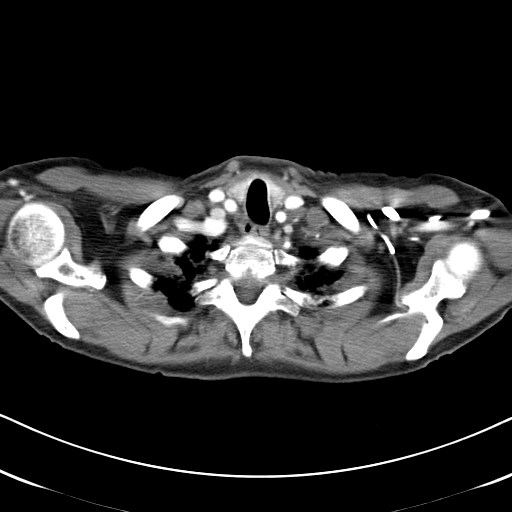

[Series 602: <mpr thick range> · coronal · 1.16mm/px · 3 of 81 slices shown]
[im 27/81  soft-tissue]
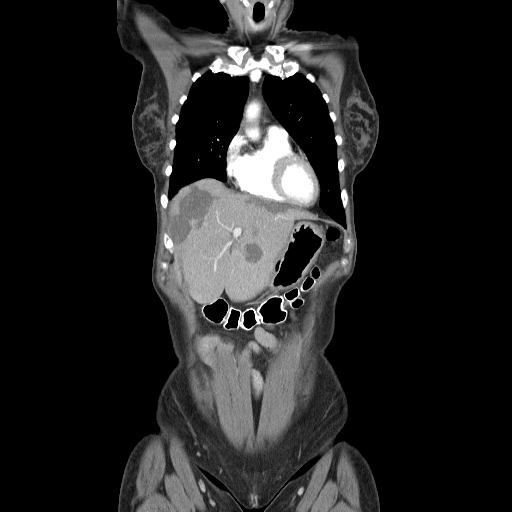
[im 36/81  soft-tissue]
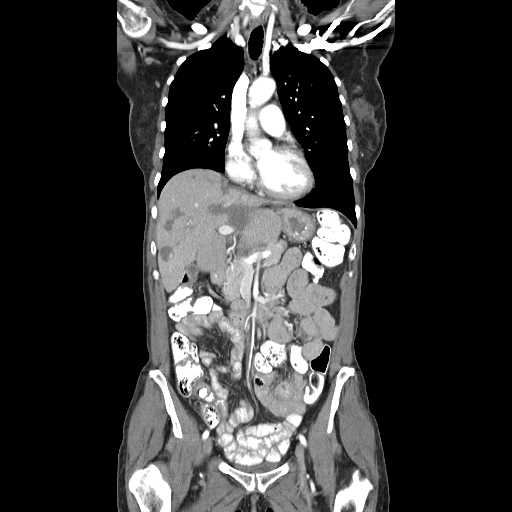
[im 45/81  soft-tissue]
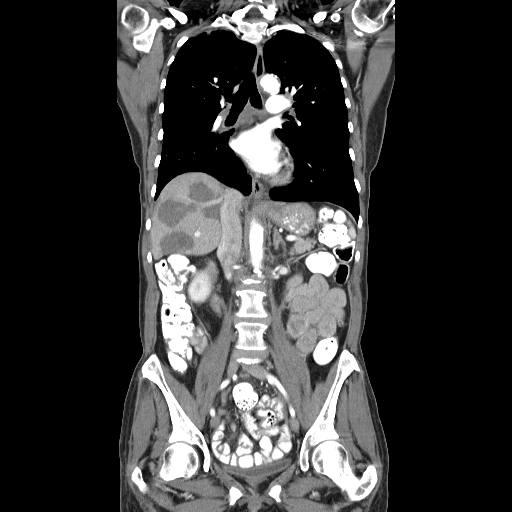

[16 of 46 positions shown; findings below may reference images not displayed]

FINDINGS: CT CHEST FINDINGS

There are small bilateral pleural effusions which appear decreased
in volume from previous exam. Mild centrilobular emphysema
identified. There is a right lower lobe peribronchovascular nodule
which measures 1.6 x 1.6 cm, image 33/series 5. Previously this
measured 2.8 x 2.2 cm. No new or enlarging pulmonary nodules or
masses.

The heart size appears normal. No pericardial effusion. Sub- carinal
lymph node measures 9 mm, image 27/series 2. Previously 1.2 cm.
Index left hilar lymph node measures 0.5 cm, image 28/series 2.
Previously 1.1 cm. The pre-vascular node measures 0.3 cm, image
23/series 2. Previously 1 cm.

No axillary or supraclavicular adenopathy. Review of the visualized
osseous structures is negative for aggressive lytic or sclerotic
bone lesion.

CT ABDOMEN AND PELVIS FINDINGS

Multi focal liver metastasis are again identified. The index lesion
within the dome measures 4.6 x 4.9 cm, image 46/ series 2.
Previously 7.7 x 6.9 cm. Index lesion in the lateral segment of left
hepatic lobe measures 3.6 x 2.5 cm, image 56/ series 2. Previously
6.5 x 4.6 cm. The gallbladder appears normal. No biliary dilatation.
Normal appearance of the pancreas. The spleen is unremarkable.

Previous left adrenal metastasis measures 1.3 x 0.5 cm, image
55/series 2. Previously 1.6 x 1.1 cm. Right kidney is normal. The
left kidney is normal. The urinary bladder appears normal. Fibroid
uterus is again noted. Exophytic fibroid arising from the right side
of the uterus measures 5.7 x 5.9 cm, image 99/series 2. This is
compared with 5.9 x 4.7 cm previously.

Calcified atherosclerotic disease involves the abdominal aorta.
There is no aneurysm. Index periaortic lymph node measures 8 mm,
image 69/series 2. Previously 1.3 cm. More distally, there is a 7 mm
nodule, image 74/series 2. Previously 1 cm. Left common iliac lymph
node measures 6 mm, image 85/series 2. Previously 1.2 cm.

The stomach is normal. The small bowel loops have a normal course
and caliber without obstruction. Normal appearance of the colon.

Review of the visualized bony structures shows no aggressive lytic
or sclerotic bone lesions. Degenerative disc disease is noted within
the lumbar spine.
IMPRESSION: 1. Interval decrease in size of right lung lesion.
2. Improvement in multi focal hepatic metastasis.
3. Retroperitoneal adenopathy is improved from previous exam.

## 2016-08-24 ENCOUNTER — Other Ambulatory Visit: Payer: Self-pay | Admitting: Nurse Practitioner
